# Patient Record
Sex: Male | Born: 1960 | Race: Black or African American | Hispanic: No | Marital: Married | Smoking: Never smoker
Health system: Southern US, Community
[De-identification: ages and names within clinical notes are randomized; demographics above are authoritative.]

## PROBLEM LIST (undated history)

## (undated) DIAGNOSIS — Z8739 Personal history of other diseases of the musculoskeletal system and connective tissue: Secondary | ICD-10-CM

## (undated) DIAGNOSIS — H409 Unspecified glaucoma: Secondary | ICD-10-CM

## (undated) DIAGNOSIS — E119 Type 2 diabetes mellitus without complications: Secondary | ICD-10-CM

## (undated) HISTORY — PX: HERNIA REPAIR: SHX51

---

## 2008-08-21 ENCOUNTER — Ambulatory Visit: Payer: Self-pay

## 2012-04-05 ENCOUNTER — Ambulatory Visit: Payer: Self-pay

## 2018-10-21 ENCOUNTER — Encounter: Payer: Self-pay | Admitting: Emergency Medicine

## 2018-10-21 ENCOUNTER — Other Ambulatory Visit: Payer: Self-pay

## 2018-10-21 ENCOUNTER — Emergency Department: Payer: Medicare Other

## 2018-10-21 ENCOUNTER — Emergency Department
Admission: EM | Admit: 2018-10-21 | Discharge: 2018-10-21 | Disposition: A | Discharge status: Home / Self Care | Payer: Medicare Other | Attending: Student | Admitting: Student

## 2018-10-21 DIAGNOSIS — R109 Unspecified abdominal pain: Secondary | ICD-10-CM | POA: Insufficient documentation

## 2018-10-21 DIAGNOSIS — R739 Hyperglycemia, unspecified: Secondary | ICD-10-CM | POA: Insufficient documentation

## 2018-10-21 HISTORY — DX: Type 2 diabetes mellitus without complications: E11.9

## 2018-10-21 HISTORY — DX: Personal history of other diseases of the musculoskeletal system and connective tissue: Z87.39

## 2018-10-21 LAB — CBC
HCT: 45.1 % (ref 39.0–52.0)
Hemoglobin: 14.9 g/dL (ref 13.0–17.0)
MCH: 25.3 pg — ABNORMAL LOW (ref 26.0–34.0)
MCHC: 33 g/dL (ref 30.0–36.0)
MCV: 76.6 fL — ABNORMAL LOW (ref 80.0–100.0)
Platelets: 310 10*3/uL (ref 150–400)
RBC: 5.89 MIL/uL — ABNORMAL HIGH (ref 4.22–5.81)
RDW: 12.3 % (ref 11.5–15.5)
WBC: 8.3 10*3/uL (ref 4.0–10.5)
nRBC: 0 % (ref 0.0–0.2)

## 2018-10-21 LAB — COMPREHENSIVE METABOLIC PANEL
ALT: 24 U/L (ref 0–44)
AST: 21 U/L (ref 15–41)
Albumin: 4 g/dL (ref 3.5–5.0)
Alkaline Phosphatase: 126 U/L (ref 38–126)
Anion gap: 9 (ref 5–15)
BUN: 17 mg/dL (ref 6–20)
CO2: 24 mmol/L (ref 22–32)
Calcium: 9.3 mg/dL (ref 8.9–10.3)
Chloride: 99 mmol/L (ref 98–111)
Creatinine, Ser: 0.94 mg/dL (ref 0.61–1.24)
GFR calc Af Amer: >60 mL/min (ref >60)
GFR calc non Af Amer: >60 mL/min (ref >60)
Glucose, Bld: 521 mg/dL (ref 70–99)
Potassium: 4.5 mmol/L (ref 3.5–5.1)
Sodium: 132 mmol/L — ABNORMAL LOW (ref 135–145)
Total Bilirubin: 0.7 mg/dL (ref 0.3–1.2)
Total Protein: 8.1 g/dL (ref 6.5–8.1)

## 2018-10-21 LAB — URINALYSIS, COMPLETE (UACMP) WITH MICROSCOPIC
Bacteria, UA: NONE SEEN
Bilirubin Urine: NEGATIVE
Glucose, UA: >=500 mg/dL — AB
Hgb urine dipstick: NEGATIVE
Ketones, ur: 5 mg/dL — AB
Leukocytes,Ua: NEGATIVE
Nitrite: NEGATIVE
Protein, ur: 30 mg/dL — AB
Specific Gravity, Urine: 1.028 (ref 1.005–1.030)
Squamous Epithelial / HPF: NONE SEEN (ref 0–5)
pH: 5 (ref 5.0–8.0)

## 2018-10-21 LAB — LIPASE, BLOOD: Lipase: 26 U/L (ref 11–51)

## 2018-10-21 LAB — GLUCOSE, CAPILLARY
Glucose-Capillary: 401 mg/dL — ABNORMAL HIGH (ref 70–99)
Glucose-Capillary: 335 mg/dL — ABNORMAL HIGH (ref 70–99)

## 2018-10-21 MED ORDER — SODIUM CHLORIDE 0.9 % IV BOLUS
1000.0000 mL | Freq: Once | INTRAVENOUS | Status: AC
  Administered 2018-10-21: 13:00:00 1000 mL via INTRAVENOUS

## 2018-10-21 MED ORDER — SODIUM CHLORIDE 0.9 % IV BOLUS
1000.0000 mL | Freq: Once | INTRAVENOUS | Status: AC
  Administered 2018-10-21: 1000 mL via INTRAVENOUS

## 2018-10-21 MED ORDER — MORPHINE SULFATE (PF) 4 MG/ML IV SOLN
4.0000 mg | Freq: Once | INTRAVENOUS | Status: AC
  Administered 2018-10-21: 10:00:00 4 mg via INTRAVENOUS
  Filled 2018-10-21: qty 1

## 2018-10-21 MED ORDER — METFORMIN HCL 500 MG PO TABS: 500.0000 mg | ORAL_TABLET | Freq: Two times a day (BID) | ORAL | 0 refills | Status: AC

## 2018-10-21 MED ORDER — IOHEXOL 300 MG/ML  SOLN
100.0000 mL | Freq: Once | INTRAMUSCULAR | Status: AC | PRN
  Administered 2018-10-21: 100 mL via INTRAVENOUS

## 2018-10-21 MED ORDER — ONDANSETRON HCL 4 MG/2ML IJ SOLN
4.0000 mg | Freq: Once | INTRAMUSCULAR | Status: AC
  Administered 2018-10-21: 10:00:00 4 mg via INTRAVENOUS
  Filled 2018-10-21: qty 2

## 2018-10-21 NOTE — ED Provider Notes (Addendum)
[facility] Emergency Department Provider Note  ____________________________________________   First MD Initiated Contact with Patient 10/21/18 [phone]     (approximate)  I have reviewed the triage vital signs and the nursing notes.  History  Chief Complaint Abdominal Pain    HPI Gary Sutton is a 58 y.o. male with history of glaucoma, degenerative disc disease, who presents to the emergency department for abdominal pain with some associated nausea.  Patient reports the onset of central abdominal pain several hours ago.  Associated with some mild nausea, but no vomiting.  Last bowel movement was earlier this morning.  He denies any fevers, dysuria, hematuria.  He denies any history of intra-abdominal surgeries.  He feels like his abdomen is more distended than normal.  He describes his discomfort as a tightness sensation, moderate in severity.  He denies any heavy alcohol use. No LE weakness, numbness, tingling.    Past Medical Hx Past Medical History:  Diagnosis Date  . H/O degenerative disc disease     Problem List There are no active problems to display for this patient.   Past Surgical Hx Past Surgical History:  Procedure Laterality Date  . HERNIA REPAIR      Medications Prior to Admission medications   Not on File    Allergies Patient has no known allergies.  Family Hx No family history on file.  Social Hx Social History   Tobacco Use  . Smoking status: Never Smoker  . Smokeless tobacco: Never Used  Substance Use Topics  . Alcohol use: Yes  . Drug use: Not on file     Review of Systems  Constitutional: Negative for fever, chills. Eyes: Negative for visual changes. ENT: Negative for sore throat. Cardiovascular: Negative for chest pain. Respiratory: Negative for shortness of breath. Gastrointestinal: + abdominal pain.  Genitourinary: Negative for dysuria. Musculoskeletal: Negative for leg swelling. Skin: Negative for rash.  Neurological: Negative for for headaches.   Physical Exam  Vital Signs: ED Triage Vitals [10/21/18 0949]  Enc Vitals Group     BP (!) 171/89     Pulse Rate 90     Resp 20     Temp 98.2 F (36.8 C)     Temp Source Oral     SpO2 100 %     Weight 185 lb (83.9 kg)     Height 6\' 2"  (1.88 m)     Head Circumference      Peak Flow      Pain Score 8     Pain Loc      Pain Edu?      Excl. in [place]?     Constitutional: Alert and oriented.  Head: Normocephalic. Atraumatic. Eyes: Conjunctivae clear. Sclera anicteric. Nose: No congestion. No rhinorrhea. Mouth/Throat: Mucous membranes are moist.  Neck: No stridor.   Cardiovascular: Normal rate, regular rhythm. No murmurs. Extremities well perfused. Respiratory: Normal respiratory effort.  Lungs CTAB. Gastrointestinal: Soft.  Somewhat distended.  Tender to palpation centrally, suprapubic, no rebound or guarding.  Suspect palpation of distended bladder. Musculoskeletal: No lower extremity edema. No deformities. Neurologic:  Normal speech and language. No gross focal neurologic deficits are appreciated.  Skin: Skin is warm, dry and intact. No rash noted. Psychiatric: Mood and affect are appropriate for situation.  EKG  Personally reviewed.   Rate: 92 Rhythm: Sinus Axis: Borderline leftward Intervals: Within normal limits No acute ischemic changes No STEMI    Radiology  CT: IMPRESSION: 1. No acute abdominopelvic findings. 2. Benign-appearing  LEFT renal cysts. 3. Mildly distended bladder. 4. 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    Procedures  Procedure(s) performed (including critical care):  Procedures   Initial Impression / Assessment and Plan / ED Course  58 y.o. male who presents to the ED for abdominal pain, distention, nausea, as above.  Ddx: obstruction, pancreatitis, AAA, urinary retention  Plan: labs, imaging, symptom control and reassess  CT without any acute findings.  Blood work reveals  hyperglycemia, no evidence of DKA. Glucose improved with fluids. Patient reports marked improvement in discomfort after urinating, suspect like distended bladder as etiology of his discomfort. No evidence of urinary infection. Remainder of work up unremarkable. Discussed results with patient. Will start him on metformin for new diagnosis diabetes, and given diabetic diet information. Advised follow up with PCP, given referral. Patient agreeable w/ plan and discharge. Given return precautions.     Final Clinical Impression(s) / ED Diagnosis  Final diagnoses:  Central abdominal pain       Note:  This document was prepared using Dragon voice recognition software and may include unintentional dictation errors.   Miguel Aschoff., MD 10/21/18 Barbette Reichmann    Miguel Aschoff., MD 10/21/18 [phone]

## 2018-10-21 NOTE — ED Notes (Signed)
ED Provider at bedside. 

## 2018-10-21 NOTE — ED Notes (Signed)
Patient was incontinent of urine. Patient was changed into new briefs and blue scrub pants assisted by [facility] ED tech.  Patient's son is at bedside.

## 2018-10-21 NOTE — ED Triage Notes (Addendum)
Pt arrived via POV with reports of stomach pain that started about 2 hours prior to arrival, pt c/o feeling a tightness in his stomach. Pt denies any N/V/D.  Pt also c/o low back pain related to DDD.    Abdominal is distended and tender to touch on arrival.

## 2018-10-21 NOTE — Discharge Instructions (Addendum)
Thank you for letting us take care of you in the emergency department today.   Please continue to take any regular, prescribed medications.   New medications we have prescribed:  - Metformin - to treat your diabetes  Please follow up with: - A primary care doctor to review your ER visit and follow up on your symptoms. Below are two options where you can make an appointment.   Please return to the ER for any new or worsening symptoms.

## 2018-10-21 NOTE — ED Notes (Signed)
Patient taken to CT scan.

## 2018-10-21 NOTE — ED Notes (Signed)
No portable computers available.  Patient verbalized understanding of discharge instructions and denies further questions.  Patient offered living well with diabetes book and refused.  Patient given resources booklet and given instructions on importance of primary care follow-up for diabetes.  Patient and son verbalized understanding.

## 2018-10-21 NOTE — ED Notes (Signed)
Dr. Joan Mayans aware of blood glucose of 521.

## 2019-01-19 ENCOUNTER — Emergency Department: Payer: Medicare Other

## 2019-01-19 ENCOUNTER — Encounter: Payer: Self-pay | Admitting: Emergency Medicine

## 2019-01-19 ENCOUNTER — Emergency Department
Admission: EM | Admit: 2019-01-19 | Discharge: 2019-01-19 | Disposition: A | Discharge status: Home / Self Care | Payer: Medicare Other | Attending: Emergency Medicine | Admitting: Emergency Medicine

## 2019-01-19 ENCOUNTER — Other Ambulatory Visit: Payer: Self-pay

## 2019-01-19 DIAGNOSIS — E119 Type 2 diabetes mellitus without complications: Secondary | ICD-10-CM | POA: Diagnosis not present

## 2019-01-19 DIAGNOSIS — Z20822 Contact with and (suspected) exposure to covid-19: Secondary | ICD-10-CM

## 2019-01-19 DIAGNOSIS — R109 Unspecified abdominal pain: Secondary | ICD-10-CM | POA: Diagnosis present

## 2019-01-19 DIAGNOSIS — Z79899 Other long term (current) drug therapy: Secondary | ICD-10-CM | POA: Diagnosis not present

## 2019-01-19 DIAGNOSIS — R1084 Generalized abdominal pain: Secondary | ICD-10-CM | POA: Insufficient documentation

## 2019-01-19 DIAGNOSIS — J189 Pneumonia, unspecified organism: Secondary | ICD-10-CM | POA: Insufficient documentation

## 2019-01-19 DIAGNOSIS — Z7984 Long term (current) use of oral hypoglycemic drugs: Secondary | ICD-10-CM | POA: Diagnosis not present

## 2019-01-19 HISTORY — DX: Unspecified glaucoma: H40.9

## 2019-01-19 LAB — CBC WITH DIFFERENTIAL/PLATELET
Abs Immature Granulocytes: 0.02 10*3/uL (ref 0.00–0.07)
Basophils Absolute: 0 10*3/uL (ref 0.0–0.1)
Basophils Relative: 0 %
Eosinophils Absolute: 0 10*3/uL (ref 0.0–0.5)
Eosinophils Relative: 0 %
HCT: 41.7 % (ref 39.0–52.0)
Hemoglobin: 14.3 g/dL (ref 13.0–17.0)
Immature Granulocytes: 0 %
Lymphocytes Relative: 6 %
Lymphs Abs: 0.4 10*3/uL — ABNORMAL LOW (ref 0.7–4.0)
MCH: 24.6 pg — ABNORMAL LOW (ref 26.0–34.0)
MCHC: 34.3 g/dL (ref 30.0–36.0)
MCV: 71.6 fL — ABNORMAL LOW (ref 80.0–100.0)
Monocytes Absolute: 0.3 10*3/uL (ref 0.1–1.0)
Monocytes Relative: 4 %
Neutro Abs: 6.1 10*3/uL (ref 1.7–7.7)
Neutrophils Relative %: 90 %
Platelets: 191 10*3/uL (ref 150–400)
RBC: 5.82 MIL/uL — ABNORMAL HIGH (ref 4.22–5.81)
RDW: 12.9 % (ref 11.5–15.5)
WBC: 6.9 10*3/uL (ref 4.0–10.5)
nRBC: 0 % (ref 0.0–0.2)

## 2019-01-19 LAB — URINALYSIS, ROUTINE W REFLEX MICROSCOPIC
Bilirubin Urine: NEGATIVE
Glucose, UA: >=500 mg/dL — AB
Ketones, ur: 20 mg/dL — AB
Leukocytes,Ua: NEGATIVE
Nitrite: NEGATIVE
Protein, ur: >=300 mg/dL — AB
Specific Gravity, Urine: 1.023 (ref 1.005–1.030)
pH: 5 (ref 5.0–8.0)

## 2019-01-19 LAB — COMPREHENSIVE METABOLIC PANEL
ALT: 25 U/L (ref 0–44)
AST: 21 U/L (ref 15–41)
Albumin: 3 g/dL — ABNORMAL LOW (ref 3.5–5.0)
Alkaline Phosphatase: 56 U/L (ref 38–126)
Anion gap: 14 (ref 5–15)
BUN: 14 mg/dL (ref 6–20)
CO2: 20 mmol/L — ABNORMAL LOW (ref 22–32)
Calcium: 7.9 mg/dL — ABNORMAL LOW (ref 8.9–10.3)
Chloride: 98 mmol/L (ref 98–111)
Creatinine, Ser: 0.89 mg/dL (ref 0.61–1.24)
GFR calc Af Amer: >60 mL/min (ref >60)
GFR calc non Af Amer: >60 mL/min (ref >60)
Glucose, Bld: 267 mg/dL — ABNORMAL HIGH (ref 70–99)
Potassium: 3.2 mmol/L — ABNORMAL LOW (ref 3.5–5.1)
Sodium: 132 mmol/L — ABNORMAL LOW (ref 135–145)
Total Bilirubin: 0.8 mg/dL (ref 0.3–1.2)
Total Protein: 7.5 g/dL (ref 6.5–8.1)

## 2019-01-19 LAB — ACETAMINOPHEN LEVEL: Acetaminophen (Tylenol), Serum: 11 ug/mL (ref 10–30)

## 2019-01-19 LAB — LIPASE, BLOOD: Lipase: 20 U/L (ref 11–51)

## 2019-01-19 LAB — AMMONIA: Ammonia: 14 umol/L (ref 9–35)

## 2019-01-19 LAB — PROCALCITONIN: Procalcitonin: <0.1 ng/mL

## 2019-01-19 LAB — LACTIC ACID, PLASMA: Lactic Acid, Venous: 1.8 mmol/L (ref 0.5–1.9)

## 2019-01-19 MED ORDER — SODIUM CHLORIDE 0.9 % IV BOLUS
500.0000 mL | Freq: Once | INTRAVENOUS | Status: AC
  Administered 2019-01-19: 02:00:00 500 mL via INTRAVENOUS

## 2019-01-19 MED ORDER — IOHEXOL 300 MG/ML  SOLN
100.0000 mL | Freq: Once | INTRAMUSCULAR | Status: AC | PRN
  Administered 2019-01-19: 100 mL via INTRAVENOUS

## 2019-01-19 NOTE — ED Notes (Signed)
Daughter called and with pt POC for DC and follow up discussed long with lab results

## 2019-01-19 NOTE — ED Triage Notes (Signed)
Patient presents to Emergency Department via [facility] from home with complaints of abdominal pain for 5 days with chills and SOB.     History of chronic back and left shoulder pain, and "severe" glaucoma

## 2019-01-19 NOTE — ED Triage Notes (Signed)
Pt reports taking 6 extra strength tylenol as needed, last was yesterday

## 2019-01-19 NOTE — Discharge Instructions (Signed)
As we discussed, your work-up tonight was generally reassuring with no indication of an infection in your abdomen.  Your lab work was essentially normal as well.  However there is what looks like a viral pneumonia in your lungs which is most likely due to COVID-19 (coronavirus).  You declined testing tonight but please know that you can be tested at any of the outpatient testing centers.  At this point you should consider yourself positive and try to avoid anyone other than family who help care for you at home so as not to potentially infect anyone else.  Return to the emergency department with new or worsening symptoms that concern you.

## 2019-01-19 NOTE — ED Notes (Signed)
Discharge instructions reviewed with patient. Questions fielded by this RN. Patient verbalizes understanding of instructions. Patient discharged home in stable condition per forbach. No acute distress noted at time of discharge.   Peripheral IV discontinued. Catheter intact. No signs of infiltration or redness. Gauze applied to IV site.   Pt wheeled to lobby and loaded to car

## 2019-01-19 NOTE — ED Notes (Signed)
Daughter on RN phone, given to pt

## 2019-01-19 NOTE — ED Notes (Addendum)
Pt counseled on safe tylenol dosing, pt verbally acknowledged  Pt reports last BM was Sunday

## 2019-01-19 NOTE — ED Provider Notes (Signed)
[facility] Emergency Department Provider Note  ____________________________________________   First MD Initiated Contact with Patient 01/19/19 0116     (approximate)  I have reviewed the triage vital signs and the nursing notes.   HISTORY  Chief Complaint Abdominal Pain, Shortness of Breath, and Chills    HPI Gary Sutton is a 58 y.o. male with medical issues as listed below notable for severe glaucoma and diabetes.  He presents tonight by EMS for about 5 days of sharp and aching abdominal pain and chills.  He had a very low-grade fever upon arrival to the ED.  He says he has been drinking a lot of water but has not been eating much food.  He denies nausea and vomiting.  He denies COVID-19 contacts.  He denies chest pain, sore throat, cough.  He claims that he has a little bit of shortness of breath associated with the abdominal pain.  He also feels like his abdomen is distended.  He is unaware of any history of kidney or liver dysfunction.  He describes the symptoms as moderate and he has been taking extra strength Tylenol which may help a little bit.  Nothing in particular makes the symptoms worse.  He has chronic back issues that results in some chronic numbness and tingling in bilateral lower extremities which he says is stable.         Past Medical History:  Diagnosis Date  . Diabetes mellitus without complication (HCC)   . Glaucoma   . H/O degenerative disc disease     There are no problems to display for this patient.   Past Surgical History:  Procedure Laterality Date  . HERNIA REPAIR      Prior to Admission medications   Medication Sig Start Date End Date Taking? Authorizing Provider  brimonidine (ALPHAGAN) 0.2 % ophthalmic solution Place 1 drop into both eyes 2 (two) times daily.    [provider]  cholecalciferol (VITAMIN D3) 25 MCG (1000 UT) tablet Take 1,000 Units by mouth daily.    [provider]  metFORMIN  (GLUCOPHAGE) 500 MG tablet Take 1 tablet (500 mg total) by mouth 2 (two) times daily with a meal. 10/21/18 12/20/18  Miguel AschoffMonks, Sarah L., MD  Methylsulfonylmethane (MSM) 1000 MG CAPS Take 1 capsule by mouth daily.    [provider]  timolol (BETIMOL) 0.25 % ophthalmic solution Place 1 drop into both eyes 2 (two) times daily.     [provider]  TURMERIC PO Take 1 tablet by mouth daily.    [provider]  zinc gluconate 50 MG tablet Take 50 mg by mouth daily.    [provider]    Allergies Patient has no known allergies.  History reviewed. No pertinent family history.  Social History Social History   Tobacco Use  . Smoking status: Never Smoker  . Smokeless tobacco: Never Used  Substance Use Topics  . Alcohol use: Yes  . Drug use: Never    Review of Systems Constitutional: +fever/chills Eyes: No visual changes. ENT: No sore throat. Cardiovascular: Denies chest pain. Respiratory: Mild shortness of breath. Gastrointestinal: Generalized abdominal pain, no nausea nor vomiting. Genitourinary: Negative for dysuria. Musculoskeletal: Negative for neck pain.  Negative for back pain. Integumentary: Negative for rash. Neurological: Negative for headaches, focal weakness or numbness.   ____________________________________________   PHYSICAL EXAM:  VITAL SIGNS: ED Triage Vitals  Enc Vitals Group     BP 01/19/19 0133 (!) 163/85     Pulse  Rate 01/19/19 0133 (!) 101     Resp 01/19/19 0133 15     Temp 01/19/19 0133 100 F (37.8 C)     Temp Source 01/19/19 0133 Oral     SpO2 01/19/19 0133 96 %     Weight 01/19/19 0134 83.9 kg (185 lb)     Height 01/19/19 0134 1.88 m ( )     Head Circumference --      Peak Flow --      Pain Score 01/19/19 0133 8     Pain Loc --      Pain Edu? --      Excl. in [place]? --     Constitutional: Alert and oriented.  No acute distress. Eyes: Severely limited vision at baseline secondary to severe glaucoma. Head:  Atraumatic. Nose: No congestion/rhinnorhea. Mouth/Throat: Patient is wearing a mask. Neck: No stridor.  No meningeal signs.   Cardiovascular: Mild tachycardia, regular rhythm. Good peripheral circulation. Grossly normal heart sounds. Respiratory: Normal respiratory effort.  No retractions. Gastrointestinal: Distended lower abdomen that feels tense but not peritoneal.  Diffuse tenderness to palpation all throughout the lower abdomen without any specific focal tenderness. Musculoskeletal: No lower extremity tenderness nor edema. No gross deformities of extremities. Neurologic:  Normal speech and language. No gross focal neurologic deficits are appreciated.  Skin:  Skin is warm, dry and intact. Psychiatric: Mood and affect are normal. Speech and behavior are normal.  ____________________________________________   LABS (all labs ordered are listed, but only abnormal results are displayed)  Labs Reviewed  COMPREHENSIVE METABOLIC PANEL - Abnormal; Notable for the following components:      Result Value   Sodium 132 (*)    Potassium 3.2 (*)    CO2 20 (*)    Glucose, Bld 267 (*)    Calcium 7.9 (*)    Albumin 3.0 (*)    All other components within normal limits  CBC WITH DIFFERENTIAL/PLATELET - Abnormal; Notable for the following components:   RBC 5.82 (*)    MCV 71.6 (*)    MCH 24.6 (*)    Lymphs Abs 0.4 (*)    All other components within normal limits  LACTIC ACID, PLASMA  LIPASE, BLOOD  PROCALCITONIN  AMMONIA  ACETAMINOPHEN LEVEL  LACTIC ACID, PLASMA  URINALYSIS, ROUTINE W REFLEX MICROSCOPIC   ____________________________________________  EKG  ED ECG REPORT I, Loleta Rose, the attending physician, personally viewed and interpreted this ECG.  Date: 01/19/2019 EKG Time: 1:21 AM Rate: 101 Rhythm: Sinus tachycardia QRS Axis: normal Intervals: normal ST/T Wave abnormalities: Non-specific ST segment / T-wave changes, but no clear evidence of acute ischemia. Narrative  Interpretation: no definitive evidence of acute ischemia; does not meet STEMI criteria.   ____________________________________________  RADIOLOGY I, Loleta Rose, personally viewed and evaluated these images (plain radiographs) as part of my medical decision making, as well as reviewing the written report by the radiologist.  ED MD interpretation:  No acute abnormalities in abd/pelvis, but patchy ground-glass opacities in lung bases consistent with atypical pneumonia.  Official radiology report(s): CT abd/pelvis w/ IV contrast  Result Date: 01/19/2019 CLINICAL DATA:  Nausea vomiting abdominal pain EXAM: CT ABDOMEN AND PELVIS WITH CONTRAST TECHNIQUE: Multidetector CT imaging of the abdomen and pelvis was performed using the standard protocol following bolus administration of intravenous contrast. CONTRAST:  OMNIPAQUE IOHEXOL 300 MG/ML  SOLN COMPARISON:  October 21, 2018 FINDINGS: Lower chest: The visualized heart size within normal limits. No pericardial fluid/thickening. No hiatal hernia. Patchy ground-glass opacities are seen  at the periphery of the bilateral lung bases. Hepatobiliary: The liver is normal in density without focal abnormality.The main portal vein is patent. No evidence of calcified gallstones, gallbladder wall thickening or biliary dilatation. Pancreas: Unremarkable. No pancreatic ductal dilatation or surrounding inflammatory changes. Spleen: Normal in size without focal abnormality. Adrenals/Urinary Tract: Both adrenal glands appear normal. Again noted are multiple low-density lesions within the left kidney the largest measuring 3 cm in the midpole. Mild bilateral perinephric stranding is seen. No hydronephrosis. No renal or collecting system calculi Stomach/Bowel: The stomach, small bowel, and colon are normal in appearance. No inflammatory changes, wall thickening, or obstructive findings.Scattered colonic diverticula are noted. The appendix is unremarkable.  Vascular/Lymphatic: There are no enlarged mesenteric, retroperitoneal, or pelvic lymph nodes. No significant vascular findings are present. Reproductive: The prostate is unremarkable. Other: No evidence of abdominal wall mass or hernia. Musculoskeletal: Flowing osteophytes are seen in the thoracic and lumbar spine. There is ankylosis of the bilateral sacroiliac joints. There is diffuse osteopenia. Advanced bilateral hip osteoarthritis is seen with superior joint space loss and marginal osteophyte formation. IMPRESSION: Patchy/ground-glass opacities within the lower lung base, which could be due to atypical viral pneumonia. Findings which could be suggestive of ankylosing spondylitis Electronically Signed   By: Prudencio Pair M.D.   On: 01/19/2019 03:54    ____________________________________________   PROCEDURES   Procedure(s) performed (including Critical Care):  Procedures   ____________________________________________   INITIAL IMPRESSION / MDM / [place] / ED COURSE  As part of my medical decision making, I reviewed the following data within the [place] notes reviewed and incorporated, Labs reviewed , EKG interpreted , Old chart reviewed and Notes from prior ED visits   Differential diagnosis includes, but is not limited to, acute intra-abdominal infection such as appendicitis or diverticulitis, biliary disease, renal failure with ascites, electrolyte or metabolic abnormality, kidney dysfunction, less likely COVID-19.    Labs are pending and I am evaluating broadly.  Giving small fluid bolus for mild tachycardia 500 mL normal saline IV.  Will reassess.       Clinical Course as of Jan 18 437  Sat Jan 19, 2019  0212 WBC: 6.9 [CF]  0350 Acetaminophen (Tylenol), S: 11 [CF]  0350 Procalcitonin: <0.10 [CF]  0433 No acute abnormalities on abdomen and pelvis CT but suggestive of generalized opacities in the lungs, likely atypical pneumonia.  I updated  the patient with the generally reassuring results.  He is tolerating fluids right now and is in no distress.  I ask about Covid contacts and he can think of no one he knows who has tested positive.  I explained the results and I strongly encouraged him to let me test him so that he would have the results in mychart within a day or 2, and even after a discussion of why I thought it was important for him and his family, he is declining the test.  He acknowledged that he could have a test as an outpatient if he wants to do so.  I encouraged him to consider himself as Covid positive for the purposes of isolation and staying away from his family as much as possible although he does require caregivers given his limited vision and glaucoma.  There is no indication for any additional treatment at this time.  The patient is breathing comfortably, continues to have very mild tachycardia but no hypoxemia, and is in no respiratory distress.  I gave my usual and customary return  precautions.   [CF]    Clinical Course User Index [CF] Loleta Rose, MD     ____________________________________________  FINAL CLINICAL IMPRESSION(S) / ED DIAGNOSES  Final diagnoses:  Generalized abdominal pain  Atypical pneumonia  Suspected COVID-19 virus infection     MEDICATIONS GIVEN DURING THIS VISIT:  Medications  sodium chloride 0.9 % bolus 500 mL (0 mLs Intravenous Stopped 01/19/19 0300)  iohexol (OMNIPAQUE) 300 MG/ML solution 100 mL (100 mLs Intravenous Contrast Given 01/19/19 0932)     ED Discharge Orders    None      *Please note:  ANTAR MILKS was evaluated in Emergency Department on 01/19/2019 for the symptoms described in the history of present illness. He was evaluated in the context of the global COVID-19 pandemic, which necessitated consideration that the patient might be at risk for infection with the SARS-CoV-2 virus that causes COVID-19. Institutional protocols and algorithms that pertain to the  evaluation of patients at risk for COVID-19 are in a state of rapid change based on information released by regulatory bodies including the CDC and federal and state organizations. These policies and algorithms were followed during the patient's care in the ED.  Some ED evaluations and interventions may be delayed as a result of limited staffing during the pandemic.*  Note:  This document was prepared using Dragon voice recognition software and may include unintentional dictation errors.   Loleta Rose, MD 01/19/19 [phone]

## 2019-01-19 NOTE — ED Notes (Signed)
Daughter, Crystal, phone number added, please call for pick up

## 2019-11-05 ENCOUNTER — Inpatient Hospital Stay
Admission: EM | Admit: 2019-11-05 | Discharge: 2019-11-20 | DRG: 854 | Disposition: A | Discharge status: Skilled Nursing Facility | Payer: Medicare Other | Attending: Internal Medicine | Admitting: Internal Medicine

## 2019-11-05 ENCOUNTER — Other Ambulatory Visit: Payer: Self-pay

## 2019-11-05 ENCOUNTER — Emergency Department: Payer: Medicare Other

## 2019-11-05 DIAGNOSIS — M869 Osteomyelitis, unspecified: Secondary | ICD-10-CM

## 2019-11-05 DIAGNOSIS — D509 Iron deficiency anemia, unspecified: Secondary | ICD-10-CM | POA: Diagnosis present

## 2019-11-05 DIAGNOSIS — M7061 Trochanteric bursitis, right hip: Secondary | ICD-10-CM | POA: Diagnosis present

## 2019-11-05 DIAGNOSIS — E871 Hypo-osmolality and hyponatremia: Secondary | ICD-10-CM | POA: Diagnosis not present

## 2019-11-05 DIAGNOSIS — Z20822 Contact with and (suspected) exposure to covid-19: Secondary | ICD-10-CM | POA: Diagnosis present

## 2019-11-05 DIAGNOSIS — H409 Unspecified glaucoma: Secondary | ICD-10-CM | POA: Diagnosis present

## 2019-11-05 DIAGNOSIS — M8610 Other acute osteomyelitis, unspecified site: Secondary | ICD-10-CM

## 2019-11-05 DIAGNOSIS — E119 Type 2 diabetes mellitus without complications: Secondary | ICD-10-CM | POA: Diagnosis not present

## 2019-11-05 DIAGNOSIS — M459 Ankylosing spondylitis of unspecified sites in spine: Secondary | ICD-10-CM | POA: Diagnosis present

## 2019-11-05 DIAGNOSIS — E1165 Type 2 diabetes mellitus with hyperglycemia: Secondary | ICD-10-CM | POA: Diagnosis present

## 2019-11-05 DIAGNOSIS — E1140 Type 2 diabetes mellitus with diabetic neuropathy, unspecified: Secondary | ICD-10-CM | POA: Diagnosis present

## 2019-11-05 DIAGNOSIS — D649 Anemia, unspecified: Secondary | ICD-10-CM | POA: Diagnosis not present

## 2019-11-05 DIAGNOSIS — M5126 Other intervertebral disc displacement, lumbar region: Secondary | ICD-10-CM | POA: Diagnosis present

## 2019-11-05 DIAGNOSIS — E1169 Type 2 diabetes mellitus with other specified complication: Secondary | ICD-10-CM | POA: Diagnosis present

## 2019-11-05 DIAGNOSIS — E11622 Type 2 diabetes mellitus with other skin ulcer: Secondary | ICD-10-CM | POA: Diagnosis present

## 2019-11-05 DIAGNOSIS — L03031 Cellulitis of right toe: Secondary | ICD-10-CM | POA: Diagnosis present

## 2019-11-05 DIAGNOSIS — E875 Hyperkalemia: Secondary | ICD-10-CM | POA: Diagnosis not present

## 2019-11-05 DIAGNOSIS — H5462 Unqualified visual loss, left eye, normal vision right eye: Secondary | ICD-10-CM | POA: Diagnosis present

## 2019-11-05 DIAGNOSIS — Z794 Long term (current) use of insulin: Secondary | ICD-10-CM | POA: Diagnosis not present

## 2019-11-05 DIAGNOSIS — L97519 Non-pressure chronic ulcer of other part of right foot with unspecified severity: Secondary | ICD-10-CM | POA: Diagnosis present

## 2019-11-05 DIAGNOSIS — M609 Myositis, unspecified: Secondary | ICD-10-CM | POA: Diagnosis present

## 2019-11-05 DIAGNOSIS — R7881 Bacteremia: Secondary | ICD-10-CM | POA: Diagnosis not present

## 2019-11-05 DIAGNOSIS — D519 Vitamin B12 deficiency anemia, unspecified: Secondary | ICD-10-CM | POA: Diagnosis present

## 2019-11-05 DIAGNOSIS — H548 Legal blindness, as defined in USA: Secondary | ICD-10-CM | POA: Diagnosis not present

## 2019-11-05 DIAGNOSIS — Z79899 Other long term (current) drug therapy: Secondary | ICD-10-CM

## 2019-11-05 DIAGNOSIS — L089 Local infection of the skin and subcutaneous tissue, unspecified: Secondary | ICD-10-CM | POA: Diagnosis not present

## 2019-11-05 DIAGNOSIS — M7062 Trochanteric bursitis, left hip: Secondary | ICD-10-CM | POA: Diagnosis present

## 2019-11-05 DIAGNOSIS — M160 Bilateral primary osteoarthritis of hip: Secondary | ICD-10-CM | POA: Diagnosis present

## 2019-11-05 DIAGNOSIS — M009 Pyogenic arthritis, unspecified: Secondary | ICD-10-CM | POA: Diagnosis present

## 2019-11-05 DIAGNOSIS — R609 Edema, unspecified: Secondary | ICD-10-CM | POA: Diagnosis present

## 2019-11-05 DIAGNOSIS — A4101 Sepsis due to Methicillin susceptible Staphylococcus aureus: Principal | ICD-10-CM | POA: Diagnosis present

## 2019-11-05 DIAGNOSIS — G629 Polyneuropathy, unspecified: Secondary | ICD-10-CM | POA: Diagnosis present

## 2019-11-05 DIAGNOSIS — M8600 Acute hematogenous osteomyelitis, unspecified site: Secondary | ICD-10-CM | POA: Diagnosis not present

## 2019-11-05 DIAGNOSIS — Z7984 Long term (current) use of oral hypoglycemic drugs: Secondary | ICD-10-CM

## 2019-11-05 DIAGNOSIS — B9561 Methicillin susceptible Staphylococcus aureus infection as the cause of diseases classified elsewhere: Secondary | ICD-10-CM | POA: Diagnosis not present

## 2019-11-05 LAB — CBC WITH DIFFERENTIAL/PLATELET
Abs Immature Granulocytes: 0.06 10*3/uL (ref 0.00–0.07)
Basophils Absolute: 0 10*3/uL (ref 0.0–0.1)
Basophils Relative: 0 %
Eosinophils Absolute: 0 10*3/uL (ref 0.0–0.5)
Eosinophils Relative: 0 %
HCT: 33.3 % — ABNORMAL LOW (ref 39.0–52.0)
Hemoglobin: 10.7 g/dL — ABNORMAL LOW (ref 13.0–17.0)
Immature Granulocytes: 1 %
Lymphocytes Relative: 8 %
Lymphs Abs: 1 10*3/uL (ref 0.7–4.0)
MCH: 23.9 pg — ABNORMAL LOW (ref 26.0–34.0)
MCHC: 32.1 g/dL (ref 30.0–36.0)
MCV: 74.5 fL — ABNORMAL LOW (ref 80.0–100.0)
Monocytes Absolute: 0.7 10*3/uL (ref 0.1–1.0)
Monocytes Relative: 5 %
Neutro Abs: 11.5 10*3/uL — ABNORMAL HIGH (ref 1.7–7.7)
Neutrophils Relative %: 86 %
Platelets: 621 10*3/uL — ABNORMAL HIGH (ref 150–400)
RBC: 4.47 MIL/uL (ref 4.22–5.81)
RDW: 13.4 % (ref 11.5–15.5)
WBC: 13.3 10*3/uL — ABNORMAL HIGH (ref 4.0–10.5)
nRBC: 0 % (ref 0.0–0.2)

## 2019-11-05 LAB — COMPREHENSIVE METABOLIC PANEL
ALT: 13 U/L (ref 0–44)
AST: 13 U/L — ABNORMAL LOW (ref 15–41)
Albumin: 2.7 g/dL — ABNORMAL LOW (ref 3.5–5.0)
Alkaline Phosphatase: 105 U/L (ref 38–126)
Anion gap: 14 (ref 5–15)
BUN: 11 mg/dL (ref 8–23)
CO2: 23 mmol/L (ref 22–32)
Calcium: 8.2 mg/dL — ABNORMAL LOW (ref 8.9–10.3)
Chloride: 93 mmol/L — ABNORMAL LOW (ref 98–111)
Creatinine, Ser: 0.89 mg/dL (ref 0.61–1.24)
GFR, Estimated: >60 mL/min (ref >60)
Glucose, Bld: 368 mg/dL — ABNORMAL HIGH (ref 70–99)
Potassium: 3.9 mmol/L (ref 3.5–5.1)
Sodium: 130 mmol/L — ABNORMAL LOW (ref 135–145)
Total Bilirubin: 0.9 mg/dL (ref 0.3–1.2)
Total Protein: 9.1 g/dL — ABNORMAL HIGH (ref 6.5–8.1)

## 2019-11-05 LAB — URINALYSIS, COMPLETE (UACMP) WITH MICROSCOPIC
Bilirubin Urine: NEGATIVE
Glucose, UA: >=500 mg/dL — AB
Ketones, ur: 20 mg/dL — AB
Leukocytes,Ua: NEGATIVE
Nitrite: NEGATIVE
Protein, ur: >=300 mg/dL — AB
Specific Gravity, Urine: 1.03 (ref 1.005–1.030)
Squamous Epithelial / HPF: NONE SEEN (ref 0–5)
pH: 5 (ref 5.0–8.0)

## 2019-11-05 LAB — LACTIC ACID, PLASMA: Lactic Acid, Venous: 1.2 mmol/L (ref 0.5–1.9)

## 2019-11-05 LAB — RESPIRATORY PANEL BY RT PCR (FLU A&B, COVID)
Influenza A by PCR: NEGATIVE
Influenza B by PCR: NEGATIVE
SARS Coronavirus 2 by RT PCR: NEGATIVE

## 2019-11-05 LAB — PROTIME-INR
INR: 1.3 — ABNORMAL HIGH (ref 0.8–1.2)
Prothrombin Time: 15.2 seconds (ref 11.4–15.2)

## 2019-11-05 MED ORDER — TIMOLOL HEMIHYDRATE 0.25 % OP SOLN
1.0000 [drp] | Freq: Two times a day (BID) | OPHTHALMIC | Status: DC
  Administered 2019-11-06 – 2019-11-20 (×28): 1 [drp] via OPHTHALMIC
  Filled 2019-11-05: qty 5

## 2019-11-05 MED ORDER — SODIUM CHLORIDE 0.9 % IV SOLN
2.0000 g | Freq: Once | INTRAVENOUS | Status: AC
  Administered 2019-11-05: 2 g via INTRAVENOUS
  Filled 2019-11-05: qty 2

## 2019-11-05 MED ORDER — FENTANYL CITRATE (PF) 100 MCG/2ML IJ SOLN
50.0000 ug | Freq: Once | INTRAMUSCULAR | Status: AC
  Administered 2019-11-05: 50 ug via INTRAVENOUS
  Filled 2019-11-05: qty 2

## 2019-11-05 MED ORDER — ZINC GLUCONATE 50 MG PO TABS: 50.0000 mg | ORAL_TABLET | Freq: Every day | ORAL | Status: DC

## 2019-11-05 MED ORDER — ACETAMINOPHEN 650 MG RE SUPP
650.0000 mg | Freq: Four times a day (QID) | RECTAL | Status: DC | PRN
  Filled 2019-11-05: qty 1

## 2019-11-05 MED ORDER — MORPHINE SULFATE (PF) 2 MG/ML IV SOLN
2.0000 mg | INTRAVENOUS | Status: DC | PRN
  Administered 2019-11-06: 2 mg via INTRAVENOUS
  Filled 2019-11-05: qty 1

## 2019-11-05 MED ORDER — LACTATED RINGERS IV BOLUS
1000.0000 mL | Freq: Once | INTRAVENOUS | Status: AC
  Administered 2019-11-05: 1000 mL via INTRAVENOUS

## 2019-11-05 MED ORDER — ACETAMINOPHEN 325 MG PO TABS
650.0000 mg | ORAL_TABLET | Freq: Four times a day (QID) | ORAL | Status: DC | PRN
  Administered 2019-11-08 – 2019-11-16 (×3): 650 mg via ORAL
  Filled 2019-11-05 (×3): qty 2

## 2019-11-05 MED ORDER — TURMERIC 500 MG PO CAPS: ORAL_CAPSULE | Freq: Every day | ORAL | Status: DC

## 2019-11-05 MED ORDER — TRAZODONE HCL 50 MG PO TABS
25.0000 mg | ORAL_TABLET | Freq: Every evening | ORAL | Status: DC | PRN
  Administered 2019-11-06 – 2019-11-16 (×6): 25 mg via ORAL
  Filled 2019-11-05 (×7): qty 1

## 2019-11-05 MED ORDER — ENOXAPARIN SODIUM 40 MG/0.4ML ~~LOC~~ SOLN
40.0000 mg | SUBCUTANEOUS | Status: DC
  Administered 2019-11-06 – 2019-11-20 (×14): 40 mg via SUBCUTANEOUS
  Filled 2019-11-05 (×14): qty 0.4

## 2019-11-05 MED ORDER — VANCOMYCIN HCL IN DEXTROSE 1-5 GM/200ML-% IV SOLN: 1000.0000 mg | Freq: Once | INTRAVENOUS | Status: DC

## 2019-11-05 MED ORDER — VITAMIN D 25 MCG (1000 UNIT) PO TABS
1000.0000 [IU] | ORAL_TABLET | Freq: Every day | ORAL | Status: DC
  Administered 2019-11-06 – 2019-11-20 (×14): 1000 [IU] via ORAL
  Filled 2019-11-05 (×15): qty 1

## 2019-11-05 MED ORDER — MSM 1000 MG PO CAPS: 1.0000 | ORAL_CAPSULE | Freq: Every day | ORAL | Status: DC

## 2019-11-05 MED ORDER — VANCOMYCIN HCL IN DEXTROSE 1-5 GM/200ML-% IV SOLN
1000.0000 mg | Freq: Once | INTRAVENOUS | Status: AC
  Administered 2019-11-05: 1000 mg via INTRAVENOUS
  Filled 2019-11-05: qty 200

## 2019-11-05 MED ORDER — MAGNESIUM HYDROXIDE 400 MG/5ML PO SUSP
30.0000 mL | Freq: Every day | ORAL | Status: DC | PRN
  Administered 2019-11-12 – 2019-11-20 (×3): 30 mL via ORAL
  Filled 2019-11-05 (×4): qty 30

## 2019-11-05 MED ORDER — SODIUM CHLORIDE 0.9 % IV SOLN: INTRAVENOUS | Status: DC

## 2019-11-05 MED ORDER — ONDANSETRON HCL 4 MG/2ML IJ SOLN
4.0000 mg | Freq: Four times a day (QID) | INTRAMUSCULAR | Status: DC | PRN
  Administered 2019-11-17: 4 mg via INTRAVENOUS

## 2019-11-05 MED ORDER — BRIMONIDINE TARTRATE 0.2 % OP SOLN
1.0000 [drp] | Freq: Two times a day (BID) | OPHTHALMIC | Status: DC
  Administered 2019-11-06 – 2019-11-20 (×28): 1 [drp] via OPHTHALMIC
  Filled 2019-11-05 (×2): qty 5

## 2019-11-05 MED ORDER — ONDANSETRON HCL 4 MG PO TABS
4.0000 mg | ORAL_TABLET | Freq: Four times a day (QID) | ORAL | Status: DC | PRN
  Filled 2019-11-05: qty 1

## 2019-11-05 NOTE — H&P (Addendum)
[place]   PATIENT NAME: Gary Sutton    MR#:  [id number]  DATE OF BIRTH:  [birth year]  DATE OF ADMISSION:  11/05/2019  PRIMARY CARE PHYSICIAN: Patient, No Pcp Per   REQUESTING/REFERRING PHYSICIAN: Phineas Semen, MD CHIEF COMPLAINT:   Chief Complaint  Patient presents with  . Leg Pain    HISTORY OF PRESENT ILLNESS:  Gary Sutton  is a 59 y.o. male with a known history of type 2 diabetes mellitus, glaucoma and degenerative disc disease, who presented to the emergency room with acute onset of right lower extremity pain involving his leg with associated right foot swelling with erythema at the big toe.  The patient stated that he had pain around his right hip flexors and has not had any trauma.  He admitted to chills but did not check his temperature.  His right big toe has been swelling over the last week.  No chest pain or dyspnea or cough or wheezing.  No dysuria, oliguria or hematuria or flank pain.  Upon presentation to the emergency room, temperature was 101.1 with a blood pressure 168/79 with heart rate of 117.  Labs revealed blood glucose of 368 and total protein of 9.1 with albumin 2.7.  CBC showed leukocytosis 13.3 with leukocytosis and anemia as well as thrombocytosis.  Influenza antigens and COVID-19 PCR came back negative.  UA showed more than 300 protein and more than 500 glucose with 20 of ketones and hyaline casts.  Blood cultures were drawn.  Two-view right foot x-ray showed erosive changes at the first MTP joint with soft tissue swelling consistent with osteomyelitis.  The patient was given 50 mcg of IV fentanyl, 1 L bolus of IV lactated Ringer and 1 g of IV vancomycin.  He will be admitted to a medical bed for further evaluation and management. PAST MEDICAL HISTORY:   Past Medical History:  Diagnosis Date  . Diabetes mellitus without complication (HCC)   . Glaucoma   . H/O degenerative disc disease     PAST SURGICAL HISTORY:   Past Surgical History:    Procedure Laterality Date  . HERNIA REPAIR      SOCIAL HISTORY:   Social History   Tobacco Use  . Smoking status: Never Smoker  . Smokeless tobacco: Never Used  Substance Use Topics  . Alcohol use: Yes    FAMILY HISTORY:  He denied any familial diseases.  DRUG ALLERGIES:  No Known Allergies  REVIEW OF SYSTEMS:   ROS As per history of present illness. All pertinent systems were reviewed above. Constitutional, HEENT, cardiovascular, respiratory, GI, GU, musculoskeletal, neuro, psychiatric, endocrine, integumentary and hematologic systems were reviewed and are otherwise negative/unremarkable except for positive findings mentioned above in the HPI.   MEDICATIONS AT HOME:   Prior to Admission medications   Medication Sig Start Date End Date Taking? Authorizing Provider  brimonidine (ALPHAGAN) 0.2 % ophthalmic solution Place 1 drop into both eyes 2 (two) times daily.    [provider]  cholecalciferol (VITAMIN D3) 25 MCG (1000 UT) tablet Take 1,000 Units by mouth daily.    [provider]  metFORMIN (GLUCOPHAGE) 500 MG tablet Take 1 tablet (500 mg total) by mouth 2 (two) times daily with a meal. 10/21/18 12/20/18  Miguel Aschoff., MD  Methylsulfonylmethane (MSM) 1000 MG CAPS Take 1 capsule by mouth daily.    [provider]  timolol (BETIMOL) 0.25 % ophthalmic solution Place 1 drop into both eyes 2 (two) times daily.  [provider]  TURMERIC PO Take 1 tablet by mouth daily.    [provider]  zinc gluconate 50 MG tablet Take 50 mg by mouth daily.    [provider]      VITAL SIGNS:  Blood pressure (!) 174/85, pulse (!) 108, temperature 99.9 F (37.7 C), temperature source Oral, resp. rate 19, height 6\' 2"  (1.88 m), weight 86.2 kg, SpO2 95 %.  PHYSICAL EXAMINATION:  Physical Exam  GENERAL:  59 y.o.-year-old acutely African-American male patient lying in the bed with no acute distress.  EYES: Pupils equal, round,  reactive to light and accommodation. No scleral icterus. Extraocular muscles intact.  HEENT: Head atraumatic, normocephalic. Oropharynx and nasopharynx clear.  NECK:  Supple, no jugular venous distention. No thyroid enlargement, no tenderness.  LUNGS: Normal breath sounds bilaterally, no wheezing, rales,rhonchi or crepitation. No use of accessory muscles of respiration.  CARDIOVASCULAR: Regular rate and rhythm, S1, S2 normal. No murmurs, rubs, or gallops.  ABDOMEN: Soft, nondistended, nontender. Bowel sounds present. No organomegaly or mass.  EXTREMITIES: No pedal edema, cyanosis, or clubbing.  NEUROLOGIC: Cranial nerves II through XII are intact. Muscle strength 5/5 in all extremities. Sensation intact. Gait not checked.  PSYCHIATRIC: The patient is alert and oriented x 3.  Normal affect and good eye contact. SKIN: Right foot and big toe swelling with first MTP erythema with induration warmth and tenderness.    LABORATORY PANEL:   CBC Recent Labs  Lab 11/05/19 1850  WBC 13.3*  HGB 10.7*  HCT 33.3*  PLT 621*   ------------------------------------------------------------------------------------------------------------------  Chemistries  Recent Labs  Lab 11/05/19 1850  NA 130*  K 3.9  CL 93*  CO2 23  GLUCOSE 368*  BUN 11  CREATININE 0.89  CALCIUM 8.2*  AST 13*  ALT 13  ALKPHOS 105  BILITOT 0.9   ------------------------------------------------------------------------------------------------------------------  Cardiac Enzymes No results for input(s): TROPONINI in the last 168 hours. ------------------------------------------------------------------------------------------------------------------  RADIOLOGY:  DG Foot 2 Views Right  Result Date: 11/05/2019 CLINICAL DATA:  Right foot pain and open wound, initial encounter EXAM: RIGHT FOOT - 2 VIEW COMPARISON:  None. FINDINGS: Significant degenerative changes are noted the first MTP joint. Some dystrophic calcification  is noted as well as erosive changes in the distal aspect of the first metatarsal. Associated soft tissue swelling is seen. These changes are consistent with osteomyelitis. Tarsal and calcaneal degenerative changes are seen. Lucency is also noted in the proximal aspect of the fourth metatarsal. This may be projectional in nature although the possibility of undisplaced fracture deserves consideration. IMPRESSION: Erosive changes at the first MTP joint with soft tissue swelling consistent with osteomyelitis. Electronically Signed   By: 01/05/2020 M.D.   On: 11/05/2019 19:19      IMPRESSION AND PLAN:   1.  Right big toe severe nonpurulent cellulitis and osteomyelitis involving the first MTP.The patient has subsequent sepsis as manifested by fever and tachycardia as well as leukocytosis, without severe sepsis or septic shock.  The patient has leg pain that could be referred pain and possibly radiculopathy related. -The patient will be admitted to a medical bed. -We will continue antibiotic therapy with IV cefepime and  vancomycin. -We will follow blood culture as well as wound culture. -Pain management will be provided. -Podiatry consult will be obtained. -I notified Dr. 01/05/2020 about the patient.  2.  Type 2 diabetes mellitus. -We will place the patient on supplement coverage with NovoLog. -We will hold off Metformin.  3.  Glaucoma. -We will  continue his Travatan and Timoptic ophthalmic gtts.  4.  DVT prophylaxis. -Subcutaneous Lovenox.  All the records are reviewed and case discussed with ED provider. The plan of care was discussed in details with the patient (and family). I answered all questions. The patient agreed to proceed with the above mentioned plan. Further management will depend upon hospital course.   CODE STATUS: Full code  Status is: Inpatient  Remains inpatient appropriate because:Ongoing active pain requiring inpatient pain management, Ongoing diagnostic testing needed  not appropriate for outpatient work up, Unsafe d/c plan, IV treatments appropriate due to intensity of illness or inability to take PO and Inpatient level of care appropriate due to severity of illness   Dispo: The patient is from: Home              Anticipated d/c is to: Home              Anticipated d/c date is: 2 days              Patient currently is not medically stable to d/c.      TOTAL TIME TAKING CARE OF THIS PATNT: 55 minutes.    Hannah Beat M.D on 11/05/2019 at 11:24 PM  [facility]   From 7 PM-7 AM, contact night-coverage [email or web address]  CC: Primary care physician; Patient, No Pcp Per

## 2019-11-05 NOTE — ED Provider Notes (Signed)
[facility] Emergency Department Provider Note   ____________________________________________   I have reviewed the triage vital signs and the nursing notes.   HISTORY  Chief Complaint Leg Pain   History limited by: Not Limited   HPI Gary Sutton is a 59 y.o. male who presents to the emergency department today because of concerns for right foot infection.  Patient states that he first noticed wound about 1 week ago.  Has bled intermittently since then.  Has been accompanied by swelling. The patient states that he has also had pain around his right hip flexor. The patient states that he did not have any trauma to his leg. Does have history of neuropathy.  The patient denies any fevers. Does have a history of diabetes.   Records reviewed. Per medical record review patient has a history of diabetes, glaucoma.   Past Medical History:  Diagnosis Date  . Diabetes mellitus without complication (HCC)   . Glaucoma   . H/O degenerative disc disease     There are no problems to display for this patient.   Past Surgical History:  Procedure Laterality Date  . HERNIA REPAIR      Prior to Admission medications   Medication Sig Start Date End Date Taking? Authorizing Provider  brimonidine (ALPHAGAN) 0.2 % ophthalmic solution Place 1 drop into both eyes 2 (two) times daily.    [provider]  cholecalciferol (VITAMIN D3) 25 MCG (1000 UT) tablet Take 1,000 Units by mouth daily.    [provider]  metFORMIN (GLUCOPHAGE) 500 MG tablet Take 1 tablet (500 mg total) by mouth 2 (two) times daily with a meal. 10/21/18 12/20/18  Miguel Aschoff., MD  Methylsulfonylmethane (MSM) 1000 MG CAPS Take 1 capsule by mouth daily.    [provider]  timolol (BETIMOL) 0.25 % ophthalmic solution Place 1 drop into both eyes 2 (two) times daily.     [provider]  TURMERIC PO Take 1 tablet by mouth daily.    [provider]  zinc gluconate 50  MG tablet Take 50 mg by mouth daily.    [provider]    Allergies Patient has no known allergies.  No family history on file.  Social History Social History   Tobacco Use  . Smoking status: Never Smoker  . Smokeless tobacco: Never Used  Vaping Use  . Vaping Use: Never used  Substance Use Topics  . Alcohol use: Yes  . Drug use: Never    Review of Systems Constitutional: No fever/chills Eyes: No visual changes. ENT: No sore throat. Cardiovascular: Denies chest pain. Respiratory: Denies shortness of breath. Gastrointestinal: No abdominal pain.  No nausea, no vomiting.  No diarrhea.   Genitourinary: Negative for dysuria. Musculoskeletal: Positive for back pain. Skin: Positive for wound to right foot.  Neurological: Negative for headaches, focal weakness or numbness.  ____________________________________________   PHYSICAL EXAM:  VITAL SIGNS: ED Triage Vitals  Enc Vitals Group     BP 11/05/19 1840 (!) 168/79     Pulse Rate 11/05/19 1840 (!) 117     Resp 11/05/19 1840 18     Temp 11/05/19 1840 (!) 101.1 F (38.4 C)     Temp Source 11/05/19 1840 Oral     SpO2 11/05/19 1840 98 %     Weight 11/05/19 1841 190 lb (86.2 kg)     Height 11/05/19 1841 6\' 2"  (1.88 m)     Head Circumference --      Peak Flow --  Pain Score 11/05/19 1840 3   Constitutional: Alert and oriented.  Eyes: Conjunctivae are normal.  ENT      Head: Normocephalic and atraumatic.      Nose: No congestion/rhinnorhea.      Mouth/Throat: Mucous membranes are moist.      Neck: No stridor. Hematological/Lymphatic/Immunilogical: No cervical lymphadenopathy. Cardiovascular: Normal rate, regular rhythm.  No murmurs, rubs, or gallops.  Respiratory: Normal respiratory effort without tachypnea nor retractions. Breath sounds are clear and equal bilaterally. No wheezes/rales/rhonchi. Gastrointestinal: Soft and non tender. No rebound. No guarding.  Genitourinary: Deferred Musculoskeletal:  Normal range of motion in all extremities. No lower extremity edema. Neurologic:  Normal speech and language. No gross focal neurologic deficits are appreciated.  Skin:  Positive for bloody wound Psychiatric: Mood and affect are normal. Speech and behavior are normal. Patient exhibits appropriate insight and judgment.  ____________________________________________    LABS (pertinent positives/negatives)  Lactic acid 1.2 CMP na 130, k 3.9, glu 368, cr 0.89 UA clear, small hgb dipstick >300 protein, 6-10 RBC, 0-5 wbc CBC wbc 13.3, hgb 10.7, plt 621 ____________________________________________   EKG  None  ____________________________________________    RADIOLOGY  Right foot Findings consistent with osteomyelitis of 1st mtp joint  ____________________________________________   PROCEDURES  Procedures  ____________________________________________   INITIAL IMPRESSION / ASSESSMENT AND PLAN / ED COURSE  Pertinent labs & imaging results that were available during my care of the patient were reviewed by me and considered in my medical decision making (see chart for details).   Patient presented to the emergency department today because of concerns for right foot pain and wound to his right foot.  On exam does have swelling and some bleeding to the base of the first digit.  Patient's initial vital signs were concerning for possible sepsis.  X-ray of the foot is consistent with osteomyelitis.  Discussed this finding with patient.  Will plan on admission for IV antibiotics further work-up and management.  ____________________________________________   FINAL CLINICAL IMPRESSION(S) / ED DIAGNOSES  Final diagnoses:  Osteomyelitis, unspecified site, unspecified type [facility])     Note: This dictation was prepared with Dragon dictation. Any transcriptional errors that result from this process are unintentional     Phineas Semen, MD 11/05/19 [phone]

## 2019-11-05 NOTE — Progress Notes (Signed)
Report from [place], [place]

## 2019-11-05 NOTE — ED Triage Notes (Signed)
Pt here with back pain and right leg pain. Pt has noted wound to right foot that is open, draining, and has a foul smell. Pt NAD in triage.

## 2019-11-06 ENCOUNTER — Inpatient Hospital Stay: Payer: Medicare Other

## 2019-11-06 DIAGNOSIS — M869 Osteomyelitis, unspecified: Secondary | ICD-10-CM | POA: Diagnosis not present

## 2019-11-06 DIAGNOSIS — R7881 Bacteremia: Secondary | ICD-10-CM | POA: Diagnosis not present

## 2019-11-06 DIAGNOSIS — B9561 Methicillin susceptible Staphylococcus aureus infection as the cause of diseases classified elsewhere: Secondary | ICD-10-CM

## 2019-11-06 DIAGNOSIS — L089 Local infection of the skin and subcutaneous tissue, unspecified: Secondary | ICD-10-CM

## 2019-11-06 DIAGNOSIS — D649 Anemia, unspecified: Secondary | ICD-10-CM | POA: Diagnosis not present

## 2019-11-06 LAB — BLOOD CULTURE ID PANEL (REFLEXED) - BCID2

## 2019-11-06 LAB — C DIFFICILE QUICK SCREEN W PCR REFLEX
C Diff antigen: NEGATIVE
C Diff interpretation: NOT DETECTED
C Diff toxin: NEGATIVE

## 2019-11-06 LAB — PROTIME-INR
INR: 1.4 — ABNORMAL HIGH (ref 0.8–1.2)
Prothrombin Time: 16.2 seconds — ABNORMAL HIGH (ref 11.4–15.2)

## 2019-11-06 LAB — CBC WITH DIFFERENTIAL/PLATELET
Abs Immature Granulocytes: 0.14 10*3/uL — ABNORMAL HIGH (ref 0.00–0.07)
Basophils Absolute: 0 10*3/uL (ref 0.0–0.1)
Basophils Relative: 0 %
Eosinophils Absolute: 0 10*3/uL (ref 0.0–0.5)
Eosinophils Relative: 0 %
HCT: 30.9 % — ABNORMAL LOW (ref 39.0–52.0)
Hemoglobin: 10 g/dL — ABNORMAL LOW (ref 13.0–17.0)
Immature Granulocytes: 1 %
Lymphocytes Relative: 9 %
Lymphs Abs: 1.3 10*3/uL (ref 0.7–4.0)
MCH: 24 pg — ABNORMAL LOW (ref 26.0–34.0)
MCHC: 32.4 g/dL (ref 30.0–36.0)
MCV: 74.1 fL — ABNORMAL LOW (ref 80.0–100.0)
Monocytes Absolute: 1.1 10*3/uL — ABNORMAL HIGH (ref 0.1–1.0)
Monocytes Relative: 7 %
Neutro Abs: 12.5 10*3/uL — ABNORMAL HIGH (ref 1.7–7.7)
Neutrophils Relative %: 83 %
Platelets: 552 10*3/uL — ABNORMAL HIGH (ref 150–400)
RBC: 4.17 MIL/uL — ABNORMAL LOW (ref 4.22–5.81)
RDW: 13.6 % (ref 11.5–15.5)
WBC: 15.2 10*3/uL — ABNORMAL HIGH (ref 4.0–10.5)
nRBC: 0 % (ref 0.0–0.2)

## 2019-11-06 LAB — GLUCOSE, CAPILLARY
Glucose-Capillary: 314 mg/dL — ABNORMAL HIGH (ref 70–99)
Glucose-Capillary: 322 mg/dL — ABNORMAL HIGH (ref 70–99)
Glucose-Capillary: 282 mg/dL — ABNORMAL HIGH (ref 70–99)
Glucose-Capillary: 258 mg/dL — ABNORMAL HIGH (ref 70–99)

## 2019-11-06 LAB — COMPREHENSIVE METABOLIC PANEL
ALT: 11 U/L (ref 0–44)
AST: 13 U/L — ABNORMAL LOW (ref 15–41)
Albumin: 2.2 g/dL — ABNORMAL LOW (ref 3.5–5.0)
Alkaline Phosphatase: 86 U/L (ref 38–126)
Anion gap: 11 (ref 5–15)
BUN: 11 mg/dL (ref 8–23)
CO2: 25 mmol/L (ref 22–32)
Calcium: 8.1 mg/dL — ABNORMAL LOW (ref 8.9–10.3)
Chloride: 95 mmol/L — ABNORMAL LOW (ref 98–111)
Creatinine, Ser: 0.9 mg/dL (ref 0.61–1.24)
GFR, Estimated: >60 mL/min (ref >60)
Glucose, Bld: 320 mg/dL — ABNORMAL HIGH (ref 70–99)
Potassium: 3.7 mmol/L (ref 3.5–5.1)
Sodium: 131 mmol/L — ABNORMAL LOW (ref 135–145)
Total Bilirubin: 1.1 mg/dL (ref 0.3–1.2)
Total Protein: 8.2 g/dL — ABNORMAL HIGH (ref 6.5–8.1)

## 2019-11-06 LAB — HEMOGLOBIN A1C
Hgb A1c MFr Bld: 12.6 % — ABNORMAL HIGH (ref 4.8–5.6)
Mean Plasma Glucose: 315 mg/dL

## 2019-11-06 LAB — LACTIC ACID, PLASMA: Lactic Acid, Venous: 1.1 mmol/L (ref 0.5–1.9)

## 2019-11-06 LAB — APTT: aPTT: 46 seconds — ABNORMAL HIGH (ref 24–36)

## 2019-11-06 MED ORDER — INSULIN ASPART 100 UNIT/ML ~~LOC~~ SOLN
0.0000 [IU] | Freq: Three times a day (TID) | SUBCUTANEOUS | Status: DC
  Administered 2019-11-06 (×2): 11 [IU] via SUBCUTANEOUS
  Administered 2019-11-06 – 2019-11-07 (×4): 8 [IU] via SUBCUTANEOUS
  Administered 2019-11-07: 5 [IU] via SUBCUTANEOUS
  Administered 2019-11-07 – 2019-11-08 (×4): 3 [IU] via SUBCUTANEOUS
  Administered 2019-11-09 – 2019-11-10 (×3): 2 [IU] via SUBCUTANEOUS
  Administered 2019-11-11: 3 [IU] via SUBCUTANEOUS
  Administered 2019-11-11 (×2): 2 [IU] via SUBCUTANEOUS
  Administered 2019-11-11: 3 [IU] via SUBCUTANEOUS
  Administered 2019-11-12: 5 [IU] via SUBCUTANEOUS
  Administered 2019-11-12: 3 [IU] via SUBCUTANEOUS
  Administered 2019-11-12: 2 [IU] via SUBCUTANEOUS
  Administered 2019-11-12 – 2019-11-13 (×2): 3 [IU] via SUBCUTANEOUS
  Filled 2019-11-06 (×21): qty 1

## 2019-11-06 MED ORDER — VANCOMYCIN HCL 1500 MG/300ML IV SOLN
1500.0000 mg | Freq: Two times a day (BID) | INTRAVENOUS | Status: DC
  Filled 2019-11-06 (×2): qty 300

## 2019-11-06 MED ORDER — SODIUM CHLORIDE 0.9 % IV SOLN
2.0000 g | Freq: Three times a day (TID) | INTRAVENOUS | Status: DC
  Administered 2019-11-06: 2 g via INTRAVENOUS
  Filled 2019-11-06: qty 2

## 2019-11-06 MED ORDER — CYCLOBENZAPRINE HCL 10 MG PO TABS
10.0000 mg | ORAL_TABLET | Freq: Three times a day (TID) | ORAL | Status: DC | PRN
  Administered 2019-11-06 – 2019-11-12 (×13): 10 mg via ORAL
  Filled 2019-11-06 (×14): qty 1

## 2019-11-06 MED ORDER — SODIUM CHLORIDE 0.9 % IV SOLN: 2.0000 g | Freq: Once | INTRAVENOUS | Status: DC

## 2019-11-06 MED ORDER — CEFAZOLIN SODIUM-DEXTROSE 2-4 GM/100ML-% IV SOLN
2.0000 g | Freq: Three times a day (TID) | INTRAVENOUS | Status: DC
  Administered 2019-11-06 – 2019-11-20 (×42): 2 g via INTRAVENOUS
  Filled 2019-11-06 (×49): qty 100

## 2019-11-06 MED ORDER — VANCOMYCIN HCL IN DEXTROSE 1-5 GM/200ML-% IV SOLN: 1000.0000 mg | Freq: Once | INTRAVENOUS | Status: DC

## 2019-11-06 MED ORDER — VANCOMYCIN HCL IN DEXTROSE 1-5 GM/200ML-% IV SOLN: 1000.0000 mg | Freq: Two times a day (BID) | INTRAVENOUS | Status: DC

## 2019-11-06 MED ORDER — GADOBUTROL 1 MMOL/ML IV SOLN
8.0000 mL | Freq: Once | INTRAVENOUS | Status: AC | PRN
  Administered 2019-11-06: 8 mL via INTRAVENOUS

## 2019-11-06 NOTE — Consult Note (Signed)
NAME: Gary Sutton  DOB: [birth year]  MRN: [id number]  Date/Time: 11/06/2019 12:23 PM  REQUESTING PROVIDER: Dr.Lai Subjective:  REASON FOR CONSULT: Staph bacteremia  Gary Sutton is a 59 y.o. with a history of Dm, DDD, glaucoma presented to the ED on 11/05/19 with rt foot swelling and pain rt leg X 1 day.   Pt says he has had foot issues for a couple of weeks- he is not sure how it started- He may have hurt his rt foot some time. But what brought him to the ED was the severe pain and tightness with numbness rt leg He says he has degenerative spine disease and at base line has numbness both calves and below . On sunday night he was watching foot ball  And then felt there was pain and tightness rt groin area. He had trouble walking. The next 2 days it got worse and also the numbness got worse and he called an Gary Sutton and came to the hospital yesterday. He did not relate this properly in the ED and as he had a discharging wound in the rt foot that became the focus . Pt has no PCP and is not engaged in medical care- [facility]. He does not know that he has diabetes. He has glaucoma left eye and had been using drops  In the ED temp 101.1, BPP 168/79 and HR 117. Labs showed Blood glucose was 368 , CBC 13.3, HB 10.7, PLT 621, Cr 0.89 Blood culture sent and started on vanco/cefepime . Foot x-ray showed erosive changes at the first MTP joint with soft tissue swelling consistent with osteomyelitis. I am seeing the patient for management of staph bacteremia Past Medical History:  Diagnosis Date  . Diabetes mellitus without complication ([place])   . Glaucoma   . H/O degenerative disc disease     Past Surgical History:  Procedure Laterality Date  . HERNIA REPAIR      SH Lives with his son On disability Non smoker Drinks wine l only on 3 days in a year    No family history on file. No Known Allergies  ? Current Facility-Administered Medications  Medication Dose  Route Frequency Provider Last Rate Last Admin  . 0.9 %  sodium chloride infusion   Intravenous Continuous Mansy, Jan A, MD 100 mL/hr at 11/06/19 0807 Rate Verify at 11/06/19 0807  . acetaminophen (TYLENOL) tablet 650 mg  650 mg Oral Q6H PRN Mansy, Jan A, MD       Or  . acetaminophen (TYLENOL) suppository 650 mg  650 mg Rectal Q6H PRN Mansy, Jan A, MD      . brimonidine (ALPHAGAN) 0.2 % ophthalmic solution 1 drop  1 drop Both Eyes BID Mansy, Jan A, MD      . ceFAZolin (ANCEF) IVPB 2g/100 mL premix  2 g Intravenous Q8H Ravishankar, Joellyn Quails, MD      . cholecalciferol (VITAMIN D3) tablet 1,000 Units  1,000 Units Oral Daily Mansy, Arvella Merles, MD   1,000 Units at 11/06/19 0944  . enoxaparin (LOVENOX) injection 40 mg  40 mg Subcutaneous Q24H Mansy, Jan A, MD   40 mg at 11/06/19 0944  . insulin aspart (novoLOG) injection 0-15 Units  0-15 Units Subcutaneous TID PC,HS,0200 Mansy, Arvella Merles, MD   11 Units at 11/06/19 0943  . magnesium hydroxide (MILK OF MAGNESIA) suspension 30 mL  30 mL Oral Daily PRN Mansy, Arvella Merles, MD      . morphine  2 MG/ML injection 2 mg  2 mg Intravenous Q2H PRN Mansy, Jan A, MD      . ondansetron [facility]) tablet 4 mg  4 mg Oral Q6H PRN Mansy, Jan A, MD       Or  . ondansetron [facility]) injection 4 mg  4 mg Intravenous Q6H PRN Mansy, Jan A, MD      . timolol (BETIMOL) 0.25 % ophthalmic solution 1 drop  1 drop Both Eyes BID Mansy, Jan A, MD      . traZODone (DESYREL) tablet 25 mg  25 mg Oral QHS PRN Mansy, Arvella Merles, MD       Current Outpatient Medications  Medication Sig Dispense Refill  . brimonidine (ALPHAGAN) 0.2 % ophthalmic solution Place 1 drop into both eyes 2 (two) times daily.    . cholecalciferol (VITAMIN D3) 25 MCG (1000 UT) tablet Take 1,000 Units by mouth daily.    . Methylsulfonylmethane (MSM) 1000 MG CAPS Take 1 capsule by mouth daily.    . timolol (BETIMOL) 0.25 % ophthalmic solution Place 1 drop into both eyes 2 (two) times daily.     . Travoprost, BAK Free, (TRAVATAN) 0.004 %  SOLN ophthalmic solution Place 1 drop into both eyes at bedtime.     . TURMERIC PO Take 1 tablet by mouth daily.    Marland Kitchen zinc gluconate 50 MG tablet Take 50 mg by mouth daily.    . metFORMIN (GLUCOPHAGE) 500 MG tablet Take 1 tablet (500 mg total) by mouth 2 (two) times daily with a meal. 120 tablet 0  . timolol (TIMOPTIC) 0.25 % ophthalmic solution  (Patient not taking: Reported on 11/06/2019)       Abtx:  Anti-infectives (From admission, onward)   Start     Dose/Rate Route Frequency Ordered Stop   11/06/19 1400  ceFAZolin (ANCEF) IVPB 2g/100 mL premix        2 g 200 mL/hr over 30 Minutes Intravenous Every 8 hours 11/06/19 0925     11/06/19 0900  vancomycin (VANCOCIN) IVPB 1000 mg/200 mL premix  Status:  Discontinued        1,000 mg 200 mL/hr over 60 Minutes Intravenous Every 12 hours 11/06/19 0110 11/06/19 0757   11/06/19 0900  vancomycin (VANCOREADY) IVPB 1500 mg/300 mL  Status:  Discontinued        1,500 mg 150 mL/hr over 120 Minutes Intravenous Every 12 hours 11/06/19 0757 11/06/19 0925   11/06/19 0800  ceFEPIme (MAXIPIME) 2 g in sodium chloride 0.9 % 100 mL IVPB  Status:  Discontinued        2 g 200 mL/hr over 30 Minutes Intravenous Every 8 hours 11/06/19 0107 11/06/19 0925   11/06/19 0230  ceFEPIme (MAXIPIME) 2 g in sodium chloride 0.9 % 100 mL IVPB  Status:  Discontinued        2 g 200 mL/hr over 30 Minutes Intravenous  Once 11/06/19 0228 11/06/19 0232   11/06/19 0230  vancomycin (VANCOCIN) IVPB 1000 mg/200 mL premix  Status:  Discontinued        1,000 mg 200 mL/hr over 60 Minutes Intravenous  Once 11/06/19 0228 11/06/19 0234   11/05/19 2345  ceFEPIme (MAXIPIME) 2 g in sodium chloride 0.9 % 100 mL IVPB        2 g 200 mL/hr over 30 Minutes Intravenous  Once 11/05/19 2324 11/06/19 0222   11/05/19 2330  vancomycin (VANCOCIN) IVPB 1000 mg/200 mL premix  Status:  Discontinued        1,000 mg  200 mL/hr over 60 Minutes Intravenous  Once 11/05/19 2324 11/05/19 2326   11/05/19 2115   vancomycin (VANCOCIN) IVPB 1000 mg/200 mL premix        1,000 mg 200 mL/hr over 60 Minutes Intravenous  Once 11/05/19 2112 11/05/19 2227      REVIEW OF SYSTEMS:  Const: negative fever, negative chills, negative weight loss Eyes: negative diplopia or visual changes, negative eye pain ENT: negative coryza, negative sore throat Resp: negative cough, hemoptysis, dyspnea Cards: negative for chest pain, palpitations, lower extremity edema GU: negative for frequency, dysuria and hematuria GI: Negative for abdominal pain, diarrhea, bleeding, constipation Skin: negative for rash and pruritus Heme: negative for easy bruising and gum/nose bleeding MS: as above Neurolo:numbness rt leg Psych: negative for feelings of anxiety, depression  Endocrine: negative for thyroid, diabetes Allergy/Immunology- negative for any medication or food allergies ? Objective:  VITALS:  BP (!) 170/84   Pulse (!) 108   Temp 98.5 F (36.9 C) (Oral)   Resp 17   Ht 6' 2" (1.88 m)   Wt 86.2 kg   SpO2 95%   BMI 24.39 kg/m  PHYSICAL EXAM:  General: Alert, cooperative, no distress, appears stated age.  Head: Normocephalic, without obvious abnormality, atraumatic. Eyes: Conjunctivae clear, anicteric sclerae. Pupils are equal ENT Nares normal. No drainage or sinus tenderness. Lips, mucosa, and tongue normal. No Thrush poor dentition  Neck: Supple, no carotid bruit and no JVD. Back: No CVA tenderness. Lungs: Clear to auscultation bilaterally. No Wheezing or Rhonchi. No rales. Heart: s1s2 Abdomen: Soft, non-tender,not distended. Bowel sounds normal. No masses Extremities: rt leg- painful SLR- unable to raise beyond 20  Both feet has dry, scaly skin Rt great toe- MTP area- discharging abscess  Skin: No rashes or lesions. Or bruising Lymph: Cervical, supraclavicular normal. Neurologic: did not examine in detail Pertinent Labs Lab Results CBC    Component Value Date/Time   WBC 15.2 (H) 11/06/2019 0450    RBC 4.17 (L) 11/06/2019 0450   HGB 10.0 (L) 11/06/2019 0450   HCT 30.9 (L) 11/06/2019 0450   PLT 552 (H) 11/06/2019 0450   MCV 74.1 (L) 11/06/2019 0450   MCH 24.0 (L) 11/06/2019 0450   MCHC 32.4 11/06/2019 0450   RDW 13.6 11/06/2019 0450   LYMPHSABS 1.3 11/06/2019 0450   MONOABS 1.1 (H) 11/06/2019 0450   EOSABS 0.0 11/06/2019 0450   BASOSABS 0.0 11/06/2019 0450    CMP Latest Ref Rng & Units 11/06/2019 11/05/2019 01/19/2019  Glucose 70 - 99 mg/dL 320(H) 368(H) 267(H)  BUN 8 - 23 mg/dL _0 Creatinine 0.61 - 1.24 mg/dL 0.90 0.89 0.89  Sodium 135 - 145 mmol/L 131(L) 130(L) 132(L)  Potassium 3.5 - 5.1 mmol/L 3.7 3.9 3.2(L)  Chloride 98 - 111 mmol/L 95(L) 93(L) 98  CO2 22 - 32 mmol/L 25 23 20(L)  Calcium 8.9 - 10.3 mg/dL 8.1(L) 8.2(L) 7.9(L)  Total Protein 6.5 - 8.1 g/dL 8.2(H) 9.1(H) 7.5  Total Bilirubin 0.3 - 1.2 mg/dL 1.1 0.9 0.8  Alkaline Phos 38 - 126 U/L 86 105 56  AST 15 - 41 U/L 13(L) 13(L) 21  ALT 0 - 44 U/L _1 Microbiology: Recent Results (from the past 240 hour(s))  Culture, blood (Routine x 2)     Status: None (Preliminary result)   Collection Time: 11/05/19  6:50 PM   Specimen: BLOOD  Result Value Ref Range Status   Specimen Description BLOOD BLOOD LEFT FOREARM  Final  Special Requests   Final    BOTTLES DRAWN AEROBIC AND ANAEROBIC Blood Culture adequate volume   Culture  Setup Time   Final    GRAM POSITIVE COCCI IN BOTH AEROBIC AND ANAEROBIC BOTTLES Organism ID to follow CRITICAL RESULT CALLED TO, READ BACK BY AND VERIFIED WITH: KAREN HAYES 11/06/19 0752 KLW Performed at [facility], [address] [place], [place] [address]    Culture GRAM POSITIVE COCCI  Final   Report Status PENDING  Incomplete  Blood Culture ID Panel (Reflexed)     Status: Abnormal   Collection Time: 11/05/19  6:50 PM  Result Value Ref Range Status   Enterococcus faecalis NOT DETECTED NOT DETECTED Final   Enterococcus Faecium NOT DETECTED NOT DETECTED  Final   Listeria monocytogenes NOT DETECTED NOT DETECTED Final   Staphylococcus species DETECTED (A) NOT DETECTED Final    Comment: CRITICAL RESULT CALLED TO, READ BACK BY AND VERIFIED WITH: KAREN HAYES 11/06/19 0752 KLW    Staphylococcus aureus (BCID) DETECTED (A) NOT DETECTED Final    Comment: CRITICAL RESULT CALLED TO, READ BACK BY AND VERIFIED WITH: KAREN HAYES 11/06/19 0752 KLW    Staphylococcus epidermidis NOT DETECTED NOT DETECTED Final   Staphylococcus lugdunensis NOT DETECTED NOT DETECTED Final   Streptococcus species NOT DETECTED NOT DETECTED Final   Streptococcus agalactiae NOT DETECTED NOT DETECTED Final   Streptococcus pneumoniae NOT DETECTED NOT DETECTED Final   Streptococcus pyogenes NOT DETECTED NOT DETECTED Final   A.calcoaceticus-baumannii NOT DETECTED NOT DETECTED Final   Bacteroides fragilis NOT DETECTED NOT DETECTED Final   Enterobacterales NOT DETECTED NOT DETECTED Final   Enterobacter cloacae complex NOT DETECTED NOT DETECTED Final   Escherichia coli NOT DETECTED NOT DETECTED Final   Klebsiella aerogenes NOT DETECTED NOT DETECTED Final   Klebsiella oxytoca NOT DETECTED NOT DETECTED Final   Klebsiella pneumoniae NOT DETECTED NOT DETECTED Final   Proteus species NOT DETECTED NOT DETECTED Final   Salmonella species NOT DETECTED NOT DETECTED Final   Serratia marcescens NOT DETECTED NOT DETECTED Final   Haemophilus influenzae NOT DETECTED NOT DETECTED Final   Neisseria meningitidis NOT DETECTED NOT DETECTED Final   Pseudomonas aeruginosa NOT DETECTED NOT DETECTED Final   Stenotrophomonas maltophilia NOT DETECTED NOT DETECTED Final   Candida albicans NOT DETECTED NOT DETECTED Final   Candida auris NOT DETECTED NOT DETECTED Final   Candida glabrata NOT DETECTED NOT DETECTED Final   Candida krusei NOT DETECTED NOT DETECTED Final   Candida parapsilosis NOT DETECTED NOT DETECTED Final   Candida tropicalis NOT DETECTED NOT DETECTED Final   Cryptococcus  neoformans/gattii NOT DETECTED NOT DETECTED Final   Meth resistant mecA/C and MREJ NOT DETECTED NOT DETECTED Final    Comment: Performed at [facility], [address] [place], [place] [address]  Culture, blood (Routine x 2)     Status: None (Preliminary result)   Collection Time: 11/05/19  8:47 PM   Specimen: BLOOD  Result Value Ref Range Status   Specimen Description BLOOD LEFT FA  Final   Special Requests   Final    BOTTLES DRAWN AEROBIC AND ANAEROBIC Blood Culture adequate volume   Culture  Setup Time   Final    GRAM POSITIVE COCCI IN BOTH AEROBIC AND ANAEROBIC BOTTLES CRITICAL VALUE NOTED.  VALUE IS CONSISTENT WITH PREVIOUSLY REPORTED AND CALLED VALUE. Performed at [facility], [address], [place], [place] [address]    Culture PENDING  Incomplete   Report Status PENDING  Incomplete  Respiratory  Panel by RT PCR (Flu A&B, Covid) - Nasopharyngeal Swab     Status: None   Collection Time: 11/05/19  9:27 PM   Specimen: Nasopharyngeal Swab  Result Value Ref Range Status   SARS Coronavirus 2 by RT PCR NEGATIVE NEGATIVE Final    Comment: (NOTE) SARS-CoV-2 target nucleic acids are NOT DETECTED.  The SARS-CoV-2 RNA is generally detectable in upper respiratoy specimens during the acute phase of infection. The lowest concentration of SARS-CoV-2 viral copies this assay can detect is 131 copies/mL. A negative result does not preclude SARS-Cov-2 infection and should not be used as the sole basis for treatment or other patient management decisions. A negative result may occur with  improper specimen collection/handling, submission of specimen other than nasopharyngeal swab, presence of viral mutation(s) within the areas targeted by this assay, and inadequate number of viral copies (<131 copies/mL). A negative result must be combined with clinical observations, patient history, and epidemiological information. The expected result is Negative.  Fact Sheet for  Patients:  PinkCheek.be  Fact Sheet for Healthcare Providers:  GravelBags.it  This test is no t yet approved or cleared by the [place] FDA and  has been authorized for detection and/or diagnosis of SARS-CoV-2 by FDA under an Emergency Use Authorization (EUA). This EUA will remain  in effect (meaning this test can be used) for the duration of the COVID-19 declaration under Section 564(b)(1) of the Act, 21 U.S.C. section 360bbb-3(b)(1), unless the authorization is terminated or revoked sooner.     Influenza A by PCR NEGATIVE NEGATIVE Final   Influenza B by PCR NEGATIVE NEGATIVE Final    Comment: (NOTE) The Xpert Xpress SARS-CoV-2/FLU/RSV assay is intended as an aid in  the diagnosis of influenza from Nasopharyngeal swab specimens and  should not be used as a sole basis for treatment. Nasal washings and  aspirates are unacceptable for Xpert Xpress SARS-CoV-2/FLU/RSV  testing.  Fact Sheet for Patients: PinkCheek.be  Fact Sheet for Healthcare Providers: GravelBags.it  This test is not yet approved or cleared by the [place] FDA and  has been authorized for detection and/or diagnosis of SARS-CoV-2 by  FDA under an Emergency Use Authorization (EUA). This EUA will remain  in effect (meaning this test can be used) for the duration of the  Covid-19 declaration under Section 564(b)(1) of the Act, 21  U.S.C. section 360bbb-3(b)(1), unless the authorization is  terminated or revoked. Performed at [facility], [address] [place], [place] [address]     IMAGING RESULTS:  I have personally reviewed the films ? Impression/Recommendation ? ?RT great toe infection with osteo MTP Need podiatry consult for surgical intervenion  Staph aureus bacteremia - DC vanco and cefepime and change to cefazolin  Rt hip, groin pain and numbness- need MRI of the  hip and lumbar spine to r/o infection   Anemia with increase in protein- rule out Multiple myeloma  DM- pt was not on any meds as he says he does not know that he has it. ?  Degenerative disc disease ___________________________________________________ Discussed with patient, requesting provider

## 2019-11-06 NOTE — Progress Notes (Signed)
PROGRESS NOTE    Gary Sutton  DUK:[id number] DOB: [birth year] DOA: 11/05/2019 PCP: Patient, No Pcp Per    Assessment & Plan:   Active Problems:   Toe osteomyelitis (HCC)   Gary Sutton  is a 59 y.o. male with a known history of type 2 diabetes mellitus, glaucoma and degenerative disc disease, who presented to the emergency room with acute onset of right lower extremity pain involving his leg with associated right foot swelling with erythema at the big toe.  The patient stated that he had pain around his right hip flexors and has not had any trauma.  He admitted to chills but did not check his temperature.  His right big toe has been swelling over the last week.  No chest pain or dyspnea or cough or wheezing.  No dysuria, oliguria or hematuria or flank pain.   # Sepsis 2/2  # Right big toe diabetic ulcer and osteomyelitis involving the first MTP. --fever and tachycardia as well as leukocytosis. --started on cefepime and  vancomycin. PLAN: --abx switched to cefazolin, per ID --podiatry consult  # Staph bacteremia --abx switched to cefazolin, per ID  # Low back pain, right hip and groin pain --given bacteremia, need to rule out infection in these areas --MRI lumbar spine, right hip   2.  Type 2 diabetes mellitus, poorly controlled -on home metformin. --A1c 12.6 --Start Levemir 10u BID --SSI  3.  Glaucoma. -We will continue his Travatan and Timoptic ophthalmic gtts   DVT prophylaxis: Lovenox SQ Code Status: Full code  Family Communication:  Status is: inpatient Dispo:   The patient is from: home Anticipated d/c is to: to be determined Anticipated d/c date is: >3 days Patient currently is not medically stable to d/c due to: bacteremia, osteomyelitis, need surgical resection.   Subjective and Interval History:  Pt complained of lower back pain and right thigh spasm.  Denied any pain in his right toe.     Objective: Vitals:   11/06/19 1623 11/06/19 1654 11/06/19 2157  11/06/19 2319  BP: (!) 160/74 (!) 154/75 129/64 (!) 154/77  Pulse: (!) 102 99 (!) 105 (!) 113  Resp: _0 Temp: 98.5 F (36.9 C) 100.3 F (37.9 C) (!) 100.4 F (38 C) 98.9 F (37.2 C)  TempSrc:  Oral    SpO2: 95% 95% 91% 94%  Weight:      Height:        Intake/Output Summary (Last 24 hours) at 11/07/2019 0145 Last data filed at 11/06/2019 2259 Gross per 24 hour  Intake 240 ml  Output 600 ml  Net -360 ml   Filed Weights   11/05/19 1841  Weight: 86.2 kg    Examination:   Constitutional: NAD, AAOx3 HEENT: conjunctivae and lids normal, EOMI CV: No cyanosis.   RESP: normal respiratory effort, on RA Extremities: right foot more swollen, some drainage from wound later to right great toe SKIN: warm.  Both legs have severely dry and peeling skin Neuro: II - XII grossly intact.   Psych: Normal mood and affect.      Data Reviewed: I have personally reviewed following labs and imaging studies  CBC: Recent Labs  Lab 11/05/19 1850 11/06/19 0450  WBC 13.3* 15.2*  NEUTROABS 11.5* 12.5*  HGB 10.7* 10.0*  HCT 33.3* 30.9*  MCV 74.5* 74.1*  PLT 621* 376*   Basic Metabolic Panel: Recent Labs  Lab 11/05/19 1850 11/06/19 0450  NA 130* 131*  K 3.9 3.7  CL 93*  95*  CO2 23 25  GLUCOSE 368* 320*  BUN 11 11  CREATININE 0.89 0.90  CALCIUM 8.2* 8.1*   GFR: Estimated Creatinine Clearance: 100.2 mL/min (by C-G formula based on SCr of 0.9 mg/dL). Liver Function Tests: Recent Labs  Lab 11/05/19 1850 11/06/19 0450  AST 13* 13*  ALT 13 11  ALKPHOS 105 86  BILITOT 0.9 1.1  PROT 9.1* 8.2*  ALBUMIN 2.7* 2.2*   No results for input(s): LIPASE, AMYLASE in the last 168 hours. No results for input(s): AMMONIA in the last 168 hours. Coagulation Profile: Recent Labs  Lab 11/05/19 1850 11/06/19 0450  INR 1.3* 1.4*   Cardiac Enzymes: No results for input(s): CKTOTAL, CKMB, CKMBINDEX, TROPONINI in the last 168 hours. BNP (last 3 results) No results for input(s):  PROBNP in the last 8760 hours. HbA1C: Recent Labs    11/06/19 0450  HGBA1C 12.6*   CBG: Recent Labs  Lab 11/06/19 0811 11/06/19 1214 11/06/19 1805 11/06/19 2206  GLUCAP 314* 322* 282* 258*   Lipid Profile: No results for input(s): CHOL, HDL, LDLCALC, TRIG, CHOLHDL, LDLDIRECT in the last 72 hours. Thyroid Function Tests: No results for input(s): TSH, T4TOTAL, FREET4, T3FREE, THYROIDAB in the last 72 hours. Anemia Panel: No results for input(s): VITAMINB12, FOLATE, FERRITIN, TIBC, IRON, RETICCTPCT in the last 72 hours. Sepsis Labs: Recent Labs  Lab 11/05/19 1850 11/06/19 0450  LATICACIDVEN 1.2 1.1    Recent Results (from the past 240 hour(s))  Culture, blood (Routine x 2)     Status: None (Preliminary result)   Collection Time: 11/05/19  6:50 PM   Specimen: BLOOD  Result Value Ref Range Status   Specimen Description BLOOD BLOOD LEFT FOREARM  Final   Special Requests   Final    BOTTLES DRAWN AEROBIC AND ANAEROBIC Blood Culture adequate volume   Culture  Setup Time   Final    GRAM POSITIVE COCCI IN BOTH AEROBIC AND ANAEROBIC BOTTLES Organism ID to follow CRITICAL RESULT CALLED TO, READ BACK BY AND VERIFIED WITH: Gary Sutton 11/06/19 0752 KLW Performed at [facility], [address] [place], [place] [address]    Culture GRAM POSITIVE COCCI  Final   Report Status PENDING  Incomplete  Blood Culture ID Panel (Reflexed)     Status: Abnormal   Collection Time: 11/05/19  6:50 PM  Result Value Ref Range Status   Enterococcus faecalis NOT DETECTED NOT DETECTED Final   Enterococcus Faecium NOT DETECTED NOT DETECTED Final   Listeria monocytogenes NOT DETECTED NOT DETECTED Final   Staphylococcus species DETECTED (A) NOT DETECTED Final    Comment: CRITICAL RESULT CALLED TO, READ BACK BY AND VERIFIED WITH: Gary Sutton 11/06/19 0752 KLW    Staphylococcus aureus (BCID) DETECTED (A) NOT DETECTED Final    Comment: CRITICAL RESULT CALLED TO, READ BACK BY AND VERIFIED  WITH: Gary Sutton 11/06/19 0752 KLW    Staphylococcus epidermidis NOT DETECTED NOT DETECTED Final   Staphylococcus lugdunensis NOT DETECTED NOT DETECTED Final   Streptococcus species NOT DETECTED NOT DETECTED Final   Streptococcus agalactiae NOT DETECTED NOT DETECTED Final   Streptococcus pneumoniae NOT DETECTED NOT DETECTED Final   Streptococcus pyogenes NOT DETECTED NOT DETECTED Final   A.calcoaceticus-baumannii NOT DETECTED NOT DETECTED Final   Bacteroides fragilis NOT DETECTED NOT DETECTED Final   Enterobacterales NOT DETECTED NOT DETECTED Final   Enterobacter cloacae complex NOT DETECTED NOT DETECTED Final   Escherichia coli NOT DETECTED NOT DETECTED Final   Klebsiella aerogenes NOT DETECTED NOT DETECTED Final  Klebsiella oxytoca NOT DETECTED NOT DETECTED Final   Klebsiella pneumoniae NOT DETECTED NOT DETECTED Final   Proteus species NOT DETECTED NOT DETECTED Final   Salmonella species NOT DETECTED NOT DETECTED Final   Serratia marcescens NOT DETECTED NOT DETECTED Final   Haemophilus influenzae NOT DETECTED NOT DETECTED Final   Neisseria meningitidis NOT DETECTED NOT DETECTED Final   Pseudomonas aeruginosa NOT DETECTED NOT DETECTED Final   Stenotrophomonas maltophilia NOT DETECTED NOT DETECTED Final   Candida albicans NOT DETECTED NOT DETECTED Final   Candida auris NOT DETECTED NOT DETECTED Final   Candida glabrata NOT DETECTED NOT DETECTED Final   Candida krusei NOT DETECTED NOT DETECTED Final   Candida parapsilosis NOT DETECTED NOT DETECTED Final   Candida tropicalis NOT DETECTED NOT DETECTED Final   Cryptococcus neoformans/gattii NOT DETECTED NOT DETECTED Final   Meth resistant mecA/C and MREJ NOT DETECTED NOT DETECTED Final    Comment: Performed at [facility], [address] [place], [place] [address]  Culture, blood (Routine x 2)     Status: None (Preliminary result)   Collection Time: 11/05/19  8:47 PM   Specimen: BLOOD  Result Value Ref Range Status    Specimen Description   Final    BLOOD LEFT FA Performed at [facility], [address] [place], [place] [address]    Special Requests   Final    BOTTLES DRAWN AEROBIC AND ANAEROBIC Blood Culture adequate volume Performed at [facility], [address] [place], [place] [address]    Culture  Setup Time   Final    GRAM POSITIVE COCCI IN BOTH AEROBIC AND ANAEROBIC BOTTLES CRITICAL VALUE NOTED.  VALUE IS CONSISTENT WITH PREVIOUSLY REPORTED AND CALLED VALUE. Performed at [facility], [address] [place], [place] [address]    Culture [facility] POSITIVE COCCI  Final   Report Status PENDING  Incomplete  Respiratory Panel by RT PCR (Flu A&B, Covid) - Nasopharyngeal Swab     Status: None   Collection Time: 11/05/19  9:27 PM   Specimen: Nasopharyngeal Swab  Result Value Ref Range Status   SARS Coronavirus 2 by RT PCR NEGATIVE NEGATIVE Final    Comment: (NOTE) SARS-CoV-2 target nucleic acids are NOT DETECTED.  The SARS-CoV-2 RNA is generally detectable in upper respiratoy specimens during the acute phase of infection. The lowest concentration of SARS-CoV-2 viral copies this assay can detect is 131 copies/mL. A negative result does not preclude SARS-Cov-2 infection and should not be used as the sole basis for treatment or other patient management decisions. A negative result may occur with  improper specimen collection/handling, submission of specimen other than nasopharyngeal swab, presence of viral mutation(s) within the areas targeted by this assay, and inadequate number of viral copies (<131 copies/mL). A negative result must be combined with clinical observations, patient history, and epidemiological information. The expected result is Negative.  Fact Sheet for Patients:  PinkCheek.be  Fact Sheet for Healthcare Providers:  GravelBags.it  This test is no t yet approved or cleared by the [place] FDA and  has been authorized for detection and/or diagnosis of SARS-CoV-2 by FDA under an Emergency Use Authorization (EUA). This EUA will remain  in effect (meaning this test can be used) for the duration of the COVID-19 declaration under Section 564(b)(1) of the Act, 21 U.S.C. section 360bbb-3(b)(1), unless the authorization is terminated or revoked sooner.     Influenza A by PCR NEGATIVE NEGATIVE Final   Influenza B by PCR NEGATIVE NEGATIVE  Final    Comment: (NOTE) The Xpert Xpress SARS-CoV-2/FLU/RSV assay is intended as an aid in  the diagnosis of influenza from Nasopharyngeal swab specimens and  should not be used as a sole basis for treatment. Nasal washings and  aspirates are unacceptable for Xpert Xpress SARS-CoV-2/FLU/RSV  testing.  Fact Sheet for Patients: PinkCheek.be  Fact Sheet for Healthcare Providers: GravelBags.it  This test is not yet approved or cleared by the [place] FDA and  has been authorized for detection and/or diagnosis of SARS-CoV-2 by  FDA under an Emergency Use Authorization (EUA). This EUA will remain  in effect (meaning this test can be used) for the duration of the  Covid-19 declaration under Section 564(b)(1) of the Act, 21  U.S.C. section 360bbb-3(b)(1), unless the authorization is  terminated or revoked. Performed at [facility], [place], [place] [address]   C Difficile Quick Screen w PCR reflex     Status: None   Collection Time: 11/06/19  9:48 AM   Specimen: STOOL  Result Value Ref Range Status   C Diff antigen NEGATIVE NEGATIVE Final   C Diff toxin NEGATIVE NEGATIVE Final   C Diff interpretation No C. difficile detected.  Final    Comment: Performed at [facility], [address] [place], [place] [address]  Aerobic/Anaerobic Culture (surgical/deep wound)     Status: None (Preliminary result)   Collection Time: 11/06/19  5:55 PM    Specimen: Toe  Result Value Ref Range Status   Specimen Description   Final    TOE LEFT GREAT TOE Performed at [facility], [address] [place], [place] [address]    Special Requests   Final    NONE Performed at [facility], [address] [place], [place] [address]    Gram Stain   Final    RARE WBC PRESENT,BOTH PMN AND MONONUCLEAR RARE GRAM POSITIVE COCCI Performed at [facility], [place] [address] [place],  [address]    Culture PENDING  Incomplete   Report Status PENDING  Incomplete      Radiology Studies: MR Lumbar Spine W Wo Contrast  Result Date: 11/06/2019 CLINICAL DATA:  Back pain and bacteremia EXAM: MRI LUMBAR SPINE WITHOUT AND WITH CONTRAST TECHNIQUE: Multiplanar and multiecho pulse sequences of the lumbar spine were obtained without and with intravenous contrast. CONTRAST:  32m GADAVIST GADOBUTROL 1 MMOL/ML IV SOLN COMPARISON:  None. FINDINGS: Segmentation:  Standard Alignment:  Physiologic. Vertebrae: Nonspecific heterogeneous bone marrow signal. No abnormal contrast enhancement. Conus medullaris and cauda equina: Conus extends to the L1 level. Conus and cauda equina appear normal. Paraspinal and other soft tissues: Negative Disc levels: T12-L1: Normal. L1-L2: Ankylosis. Normal disc. No spinal canal stenosis. No neural foraminal stenosis. L2-L3: Ankylosis. Normal disc. No spinal canal stenosis. No neural foraminal stenosis. L3-L4: Ankylosis. Normal disc. No spinal canal stenosis. No neural foraminal stenosis. L4-L5: Ankylosis. Small central disc protrusion. Left lateral recess narrowing without central spinal canal stenosis. No neural foraminal stenosis. L5-S1: Ankylosis. No disc herniation. No spinal canal stenosis. No neural foraminal stenosis. Visualized sacrum: Ankylosis of both sacroiliac joints. IMPRESSION: 1. No epidural abscess or discitis-osteomyelitis. 2. 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 3. Mild L4-L5 left lateral recess stenosis secondary to small central disc protrusion, a potential source of L5 radiculopathy. 4. Diffusely heterogeneous bone marrow signal is nonspecific and may be seen in chronic anemia, obesity, long-term smoking or in marrow replacement processes  such as multiple myeloma Electronically Signed   By: Ulyses Jarred M.D.   On: 11/06/2019 22:10   DG Foot 2 Views Right  Result Date: 11/05/2019 CLINICAL DATA:  Right foot pain and open wound, initial encounter EXAM: RIGHT FOOT - 2 VIEW COMPARISON:  None. FINDINGS: Significant degenerative changes are noted the first MTP joint. Some dystrophic calcification is noted as well as erosive changes in the distal aspect of the first metatarsal. Associated soft tissue swelling is seen. These changes are consistent with osteomyelitis. Tarsal and calcaneal degenerative changes are seen. Lucency is also noted in the proximal aspect of the fourth metatarsal. This may be projectional in nature although the possibility of undisplaced fracture deserves consideration. IMPRESSION: Erosive changes at the first MTP joint with soft tissue swelling consistent with osteomyelitis. Electronically Signed   By: Inez Catalina M.D.   On: 11/05/2019 19:19   DG FEMUR PORT, MIN 2 VIEWS RIGHT  Result Date: 11/06/2019 CLINICAL DATA:  Bacteremia. Right foot osteomyelitis. Right hip and right lower extremity pain. No reported injury. EXAM: RIGHT FEMUR PORTABLE 2 VIEW COMPARISON:  None. FINDINGS: No fracture. No osseous erosions. Moderate right hip osteoarthritis. No suspicious focal osseous lesions. No dislocation at the right hip or right knee on these views. No radiopaque foreign bodies. IMPRESSION: Moderate right hip osteoarthritis. No acute osseous abnormality in the right femur. Electronically Signed   By: Ilona Sorrel M.D.   On: 11/06/2019 16:14     Scheduled Meds: . brimonidine  1 drop Both Eyes BID  . cholecalciferol  1,000  Units Oral Daily  . enoxaparin (LOVENOX) injection  40 mg Subcutaneous Q24H  . insulin aspart  0-15 Units Subcutaneous TID PC,HS,0200  . timolol  1 drop Both Eyes BID   Continuous Infusions: . sodium chloride 100 mL/hr at 11/06/19 1725  .  ceFAZolin (ANCEF) IV 2 g (11/06/19 2310)     LOS: 2 days     Enzo Bi, MD [facility] If 7PM-7AM, please contact night-coverage 11/07/2019, 1:45 AM

## 2019-11-06 NOTE — Progress Notes (Signed)
Pharmacy Antibiotic Note  Gary Sutton is a 59 y.o. male admitted on 11/05/2019 with cellulitis.  Pharmacy has been consulted for Vancomycin and Cefepime dosing.  Plan: Cefepime 2gm IV q8hrs Vancomycin 1gm IV q12hrs (per nomogram)  Height: 6\' 2"  (188 cm) Weight: 86.2 kg (190 lb) IBW/kg (Calculated) : 82.2  Temp (24hrs), Avg:100.5 F (38.1 C), Min:99.9 F (37.7 C), Max:101.1 F (38.4 C)  Recent Labs  Lab 11/05/19 1850  WBC 13.3*  CREATININE 0.89  LATICACIDVEN 1.2    Estimated Creatinine Clearance: 101.3 mL/min (by C-G formula based on SCr of 0.89 mg/dL).    No Known Allergies  Antimicrobials this admission:   >>    >>   Dose adjustments this admission:   Microbiology results:  BCx:   UCx:    Sputum:    MRSA PCR:   Thank you for allowing pharmacy to be a part of this patient's care.  01/05/20 A 11/06/2019 1:10 AM

## 2019-11-06 NOTE — ED Notes (Signed)
Pt given breakfast tray

## 2019-11-06 NOTE — Progress Notes (Signed)
Pharmacy Antibiotic Note  Gary Sutton is a 60 y.o. male with medical history including diabetes admitted on 11/05/2019 with R big toe severe nonpurulent cellulitis and  osteomyelitis. Vitals with hypertension, tachycardia, fever. Podiatry has been consulted. Pharmacy has been consulted for vancomycin and cefepime dosing.   Today, 11/06/19  --Blood cultures currently with MSSA, 1/2 sets  Plan: Cefepime 2gm IV q8h  Will increase vancomycin to 1500 mg q12h --Goal trough 15-20 mcg/mL for osteomyelitis / possible bacteremia --Levels at steady state as indicated --Daily Scr per protocol  Continue to follow up cultures and ability to narrow antibiotics  Height: 6\' 2"  (188 cm) Weight: 86.2 kg (190 lb) IBW/kg (Calculated) : 82.2  Temp (24hrs), Avg:99.8 F (37.7 C), Min:98.5 F (36.9 C), Max:101.1 F (38.4 C)  Recent Labs  Lab 11/05/19 1850 11/06/19 0450  WBC 13.3* 15.2*  CREATININE 0.89 0.90  LATICACIDVEN 1.2 1.1    Estimated Creatinine Clearance: 100.2 mL/min (by C-G formula based on SCr of 0.9 mg/dL).    No Known Allergies  Antimicrobials this admission: Cefepime 10/12  >>  Vancomycin 10/12  >>   Dose adjustments this admission: 10/13: Vancomycin increased to 1500 mg q12h from 1000 mg q12h to target higher trough for osteomyelitis / possible bacteremia  Microbiology results: 10/12 BCx: 2/4 bottles (1/2 sets) GPC 10/12 BCID: MSSA 10/12 Wound cultures: pending  Thank you for allowing pharmacy to be a part of this patient's care.  12/12 11/06/2019 7:58 AM

## 2019-11-06 NOTE — Consult Note (Signed)
ORTHOPAEDIC CONSULTATION  REQUESTING PHYSICIAN: Darlin Priestly, MD  Chief Complaint: Right great toe joint infection  HPI: Gary Sutton is a 59 y.o. male who complains of infection to his right great toe joint.  Admitted with fever chills and sepsis.  Found to have positive blood cultures.  Noted drainage from his right great toe.  X-ray shows destructive changes.  Consult for possible surgical I&D.  Past Medical History:  Diagnosis Date  . Diabetes mellitus without complication (HCC)   . Glaucoma   . H/O degenerative disc disease    Past Surgical History:  Procedure Laterality Date  . HERNIA REPAIR     Social History   Socioeconomic History  . Marital status: Married    Spouse name: Not on file  . Number of children: Not on file  . Years of education: Not on file  . Highest education level: Not on file  Occupational History  . Not on file  Tobacco Use  . Smoking status: Never Smoker  . Smokeless tobacco: Never Used  Vaping Use  . Vaping Use: Never used  Substance and Sexual Activity  . Alcohol use: Yes  . Drug use: Never  . Sexual activity: Not on file  Other Topics Concern  . Not on file  Social History Narrative  . Not on file   Social Determinants of Health   Financial Resource Strain:   . Difficulty of Paying Living Expenses: Not on file  Food Insecurity:   . Worried About Programme researcher, broadcasting/film/video in the Last Year: Not on file  . Ran Out of Food in the Last Year: Not on file  Transportation Needs:   . Lack of Transportation (Medical): Not on file  . Lack of Transportation (Non-Medical): Not on file  Physical Activity:   . Days of Exercise per Week: Not on file  . Minutes of Exercise per Session: Not on file  Stress:   . Feeling of Stress : Not on file  Social Connections:   . Frequency of Communication with Friends and Family: Not on file  . Frequency of Social Gatherings with Friends and Family: Not on file  . Attends Religious Services: Not on file  .  Active Member of Clubs or Organizations: Not on file  . Attends Banker Meetings: Not on file  . Marital Status: Not on file   No family history on file. No Known Allergies Prior to Admission medications   Medication Sig Start Date End Date Taking? Authorizing Provider  brimonidine (ALPHAGAN) 0.2 % ophthalmic solution Place 1 drop into both eyes 2 (two) times daily.   Yes [provider]  cholecalciferol (VITAMIN D3) 25 MCG (1000 UT) tablet Take 1,000 Units by mouth daily.   Yes [provider]  Methylsulfonylmethane (MSM) 1000 MG CAPS Take 1 capsule by mouth daily.   Yes [provider]  timolol (BETIMOL) 0.25 % ophthalmic solution Place 1 drop into both eyes 2 (two) times daily.    Yes [provider]  Travoprost, BAK Free, (TRAVATAN) 0.004 % SOLN ophthalmic solution Place 1 drop into both eyes at bedtime.  11/05/19  Yes [provider]  TURMERIC PO Take 1 tablet by mouth daily.   Yes [provider]  zinc gluconate 50 MG tablet Take 50 mg by mouth daily.   Yes [provider]  metFORMIN (GLUCOPHAGE) 500 MG tablet Take 1 tablet (500 mg total) by mouth 2 (two) times daily with a meal. 10/21/18 12/20/18  Paschal Dopp  L., MD  timolol (TIMOPTIC) 0.25 % ophthalmic solution  10/21/19   [provider]   DG Foot 2 Views Right  Result Date: 11/05/2019 CLINICAL DATA:  Right foot pain and open wound, initial encounter EXAM: RIGHT FOOT - 2 VIEW COMPARISON:  None. FINDINGS: Significant degenerative changes are noted the first MTP joint. Some dystrophic calcification is noted as well as erosive changes in the distal aspect of the first metatarsal. Associated soft tissue swelling is seen. These changes are consistent with osteomyelitis. Tarsal and calcaneal degenerative changes are seen. Lucency is also noted in the proximal aspect of the fourth metatarsal. This may be projectional in nature although the possibility of  undisplaced fracture deserves consideration. IMPRESSION: Erosive changes at the first MTP joint with soft tissue swelling consistent with osteomyelitis. Electronically Signed   By: Alcide Clever M.D.   On: 11/05/2019 19:19   DG FEMUR PORT, MIN 2 VIEWS RIGHT  Result Date: 11/06/2019 CLINICAL DATA:  Bacteremia. Right foot osteomyelitis. Right hip and right lower extremity pain. No reported injury. EXAM: RIGHT FEMUR PORTABLE 2 VIEW COMPARISON:  None. FINDINGS: No fracture. No osseous erosions. Moderate right hip osteoarthritis. No suspicious focal osseous lesions. No dislocation at the right hip or right knee on these views. No radiopaque foreign bodies. IMPRESSION: Moderate right hip osteoarthritis. No acute osseous abnormality in the right femur. Electronically Signed   By: Delbert Phenix M.D.   On: 11/06/2019 16:14    Positive ROS: All other systems have been reviewed and were otherwise negative with the exception of those mentioned in the HPI and as above.  12 point ROS was performed.  Physical Exam: General: Alert and oriented.  No apparent distress.  Vascular:  Left foot:Dorsalis Pedis:  present Posterior Tibial:  present  Right foot: Dorsalis Pedis:  present Posterior Tibial:  present  Neuro:absent protective sensation  Derm: Left foot has a preulcerative area along his medial first MTPJ.  There is also a ulceration to his lateral fifth toe.  No exposure of bone at this time.  No purulence from this area.  Right foot with large ulceration with granulation tissue overlying the first MTPJ with purulent drainage and ulceration that probes directly to the bone and joint of his great toe joint at this time.  Noted diffuse edema to the right foot at this time.  Ortho/MS: Right foot with noted edema.  He is guarded with any attempted range of motion of the right lower extremity.  He is complaining of right hip and leg pain at this time.  Assessment: Diabetic ulceration with infection right  first MTPJ Osteomyelitis right great toe joint Sepsis with positive blood cultures.  Plan: X-rays are consistent with osteomyelitis.  At this point I had like to order an MRI to determine the extent of the infection at this time.  At minimum patient will need a partial first ray amputation.  I suspect we will have to remove a fair amount of the first ray.  I want to evaluate the surrounding joints as well as concern for further deep infection not seen on x-ray at this time.  A deep wound culture was performed by myself today.  We will follow-up tomorrow and plan for surgery likely Friday.    Irean Hong, DPM Cell [phone]   11/06/2019 5:52 PM

## 2019-11-06 NOTE — Progress Notes (Signed)
PHARMACIST - PHYSICIAN ORDER COMMUNICATION  CONCERNING: P&T Medication Policy on Herbal Medications  DESCRIPTION:  This patients order for:  MSM, Turmeric and zinc gluconate  has been noted.  These product(s) is classified as an herbal or natural product. Due to a lack of definitive safety studies or FDA approval, nonstandard manufacturing practices, plus the potential risk of unknown drug-drug interactions while on inpatient medications, the Pharmacy and Therapeutics Committee does not permit the use of herbal or natural products of this type within [facility].   ACTION TAKEN: The pharmacy department is unable to verify this order at this time. Please reevaluate patients clinical condition at discharge and address if the herbal or natural product(s) should be resumed at that time.  Bari Mantis PharmD Clinical Pharmacist 11/06/2019

## 2019-11-06 NOTE — Progress Notes (Signed)
PHARMACY - PHYSICIAN COMMUNICATION CRITICAL VALUE ALERT - BLOOD CULTURE IDENTIFICATION (BCID)  Gary Sutton is an 59 y.o. male who presented to [facility] on 11/05/2019 with a chief complaint of leg pain  Assessment: patient with DM, RLE pain with cellulitis of R big toe and X-ray showing evidence of OM.  10/12 Blood culture with GPC, BCID = MSSA.    Name of physician (or Provider) Contacted: Dr. Fran Lowes (also notify Dr. Rivka Safer of auto-ID ocnsult)  Current antibiotics: Vancomycin/Cefepime  Changes to prescribed antibiotics recommended:  Recommendations accepted by provider - per ID will narrow to cefazolin  Results for orders placed or performed during the hospital encounter of 11/05/19  Blood Culture ID Panel (Reflexed) (Collected: 11/05/2019  6:50 PM)  Result Value Ref Range   Enterococcus faecalis NOT DETECTED NOT DETECTED   Enterococcus Faecium NOT DETECTED NOT DETECTED   Listeria monocytogenes NOT DETECTED NOT DETECTED   Staphylococcus species DETECTED (A) NOT DETECTED   Staphylococcus aureus (BCID) DETECTED (A) NOT DETECTED   Staphylococcus epidermidis NOT DETECTED NOT DETECTED   Staphylococcus lugdunensis NOT DETECTED NOT DETECTED   Streptococcus species NOT DETECTED NOT DETECTED   Streptococcus agalactiae NOT DETECTED NOT DETECTED   Streptococcus pneumoniae NOT DETECTED NOT DETECTED   Streptococcus pyogenes NOT DETECTED NOT DETECTED   A.calcoaceticus-baumannii NOT DETECTED NOT DETECTED   Bacteroides fragilis NOT DETECTED NOT DETECTED   Enterobacterales NOT DETECTED NOT DETECTED   Enterobacter cloacae complex NOT DETECTED NOT DETECTED   Escherichia coli NOT DETECTED NOT DETECTED   Klebsiella aerogenes NOT DETECTED NOT DETECTED   Klebsiella oxytoca NOT DETECTED NOT DETECTED   Klebsiella pneumoniae NOT DETECTED NOT DETECTED   Proteus species NOT DETECTED NOT DETECTED   Salmonella species NOT DETECTED NOT DETECTED   Serratia marcescens NOT DETECTED NOT DETECTED    Haemophilus influenzae NOT DETECTED NOT DETECTED   Neisseria meningitidis NOT DETECTED NOT DETECTED   Pseudomonas aeruginosa NOT DETECTED NOT DETECTED   Stenotrophomonas maltophilia NOT DETECTED NOT DETECTED   Candida albicans NOT DETECTED NOT DETECTED   Candida auris NOT DETECTED NOT DETECTED   Candida glabrata NOT DETECTED NOT DETECTED   Candida krusei NOT DETECTED NOT DETECTED   Candida parapsilosis NOT DETECTED NOT DETECTED   Candida tropicalis NOT DETECTED NOT DETECTED   Cryptococcus neoformans/gattii NOT DETECTED NOT DETECTED   Meth resistant mecA/C and MREJ NOT DETECTED NOT DETECTED    Gary Sutton, PharmD, BCPS.   Work Cell: [phone] 11/06/2019 8:09 AM

## 2019-11-07 DIAGNOSIS — R7881 Bacteremia: Secondary | ICD-10-CM | POA: Diagnosis not present

## 2019-11-07 DIAGNOSIS — M869 Osteomyelitis, unspecified: Secondary | ICD-10-CM | POA: Diagnosis not present

## 2019-11-07 DIAGNOSIS — B9561 Methicillin susceptible Staphylococcus aureus infection as the cause of diseases classified elsewhere: Secondary | ICD-10-CM | POA: Diagnosis not present

## 2019-11-07 DIAGNOSIS — D649 Anemia, unspecified: Secondary | ICD-10-CM | POA: Diagnosis not present

## 2019-11-07 LAB — CBC
HCT: 27.4 % — ABNORMAL LOW (ref 39.0–52.0)
Hemoglobin: 9.2 g/dL — ABNORMAL LOW (ref 13.0–17.0)
MCH: 24.3 pg — ABNORMAL LOW (ref 26.0–34.0)
MCHC: 33.6 g/dL (ref 30.0–36.0)
MCV: 72.5 fL — ABNORMAL LOW (ref 80.0–100.0)
Platelets: 489 10*3/uL — ABNORMAL HIGH (ref 150–400)
RBC: 3.78 MIL/uL — ABNORMAL LOW (ref 4.22–5.81)
RDW: 13.6 % (ref 11.5–15.5)
WBC: 14.4 10*3/uL — ABNORMAL HIGH (ref 4.0–10.5)
nRBC: 0 % (ref 0.0–0.2)

## 2019-11-07 LAB — BASIC METABOLIC PANEL
Anion gap: 7 (ref 5–15)
BUN: 13 mg/dL (ref 8–23)
CO2: 28 mmol/L (ref 22–32)
Calcium: 7.6 mg/dL — ABNORMAL LOW (ref 8.9–10.3)
Chloride: 95 mmol/L — ABNORMAL LOW (ref 98–111)
Creatinine, Ser: 0.96 mg/dL (ref 0.61–1.24)
GFR, Estimated: >60 mL/min (ref >60)
Glucose, Bld: 310 mg/dL — ABNORMAL HIGH (ref 70–99)
Potassium: 3.5 mmol/L (ref 3.5–5.1)
Sodium: 130 mmol/L — ABNORMAL LOW (ref 135–145)

## 2019-11-07 LAB — GLUCOSE, CAPILLARY
Glucose-Capillary: 288 mg/dL — ABNORMAL HIGH (ref 70–99)
Glucose-Capillary: 248 mg/dL — ABNORMAL HIGH (ref 70–99)
Glucose-Capillary: 259 mg/dL — ABNORMAL HIGH (ref 70–99)
Glucose-Capillary: 196 mg/dL — ABNORMAL HIGH (ref 70–99)
Glucose-Capillary: 200 mg/dL — ABNORMAL HIGH (ref 70–99)

## 2019-11-07 LAB — HIV ANTIBODY (ROUTINE TESTING W REFLEX): HIV Screen 4th Generation wRfx: NONREACTIVE

## 2019-11-07 LAB — IRON AND TIBC
Iron: 10 ug/dL — ABNORMAL LOW (ref 45–182)
Saturation Ratios: 9 % — ABNORMAL LOW (ref 17.9–39.5)
TIBC: 113 ug/dL — ABNORMAL LOW (ref 250–450)
UIBC: 103 ug/dL

## 2019-11-07 LAB — CK: Total CK: 40 U/L — ABNORMAL LOW (ref 49–397)

## 2019-11-07 LAB — VITAMIN B12: Vitamin B-12: 138 pg/mL — ABNORMAL LOW (ref 180–914)

## 2019-11-07 LAB — SURGICAL PCR SCREEN
MRSA, PCR: NEGATIVE
Staphylococcus aureus: POSITIVE — AB

## 2019-11-07 LAB — MAGNESIUM: Magnesium: 1.5 mg/dL — ABNORMAL LOW (ref 1.7–2.4)

## 2019-11-07 LAB — FOLATE: Folate: 6.4 ng/mL (ref >5.9)

## 2019-11-07 MED ORDER — METRONIDAZOLE IN NACL 5-0.79 MG/ML-% IV SOLN
500.0000 mg | Freq: Three times a day (TID) | INTRAVENOUS | Status: DC
  Administered 2019-11-07 – 2019-11-18 (×33): 500 mg via INTRAVENOUS
  Filled 2019-11-07 (×37): qty 100

## 2019-11-07 MED ORDER — MAGNESIUM SULFATE 2 GM/50ML IV SOLN
2.0000 g | Freq: Once | INTRAVENOUS | Status: AC
  Administered 2019-11-07: 2 g via INTRAVENOUS
  Filled 2019-11-07: qty 50

## 2019-11-07 MED ORDER — TRAMADOL HCL 50 MG PO TABS
50.0000 mg | ORAL_TABLET | Freq: Four times a day (QID) | ORAL | Status: DC | PRN
  Administered 2019-11-07 – 2019-11-18 (×19): 50 mg via ORAL
  Filled 2019-11-07 (×19): qty 1

## 2019-11-07 MED ORDER — INSULIN STARTER KIT- PEN NEEDLES (ENGLISH)
1.0000 | Freq: Once | Status: AC
  Administered 2019-11-07: 1
  Filled 2019-11-07: qty 1

## 2019-11-07 MED ORDER — CHLORHEXIDINE GLUCONATE 4 % EX LIQD: 60.0000 mL | Freq: Once | CUTANEOUS | Status: DC

## 2019-11-07 MED ORDER — SODIUM CHLORIDE 0.9 % IV SOLN
INTRAVENOUS | Status: DC | PRN
  Administered 2019-11-07: 250 mL via INTRAVENOUS
  Administered 2019-11-09: 500 mL via INTRAVENOUS
  Administered 2019-11-09 – 2019-11-18 (×3): 250 mL via INTRAVENOUS
  Administered 2019-11-19: 100 mL via INTRAVENOUS

## 2019-11-07 MED ORDER — POVIDONE-IODINE 10 % EX SWAB: 2.0000 "application " | Freq: Once | CUTANEOUS | Status: DC

## 2019-11-07 MED ORDER — INSULIN DETEMIR 100 UNIT/ML ~~LOC~~ SOLN
10.0000 [IU] | Freq: Two times a day (BID) | SUBCUTANEOUS | Status: DC
  Administered 2019-11-07 – 2019-11-12 (×9): 10 [IU] via SUBCUTANEOUS
  Filled 2019-11-07 (×14): qty 0.1

## 2019-11-07 MED ORDER — INSULIN DETEMIR 100 UNIT/ML ~~LOC~~ SOLN
10.0000 [IU] | Freq: Two times a day (BID) | SUBCUTANEOUS | Status: DC
  Filled 2019-11-07: qty 0.1

## 2019-11-07 MED ORDER — LIVING WELL WITH DIABETES BOOK
Freq: Once | Status: AC
  Filled 2019-11-07: qty 1

## 2019-11-07 NOTE — Progress Notes (Signed)
ID  Says he has some pain and tightness rt thigh, groin area Patient Vitals for the past 24 hrs:  BP Temp Temp src Pulse Resp SpO2  11/07/19 0744 138/72 100.3 F (37.9 C) Oral 98 18 95 %  11/07/19 0254 136/76 99.1 F (37.3 C) Oral (!) 108 18 92 %  11/06/19 2319 (!) 154/77 98.9 F (37.2 C) -- (!) 113 14 94 %  11/06/19 2157 129/64 (!) 100.4 F (38 C) -- (!) 105 16 91 %  11/06/19 1654 (!) 154/75 100.3 F (37.9 C) Oral 99 17 95 %  11/06/19 1623 (!) 160/74 98.5 F (36.9 C) -- (!) 102 17 95 %    O/e awake and alert Chest b/l air entry [id number] abd soft Rt leg /groin buttock area painful  CBC Latest Ref Rng & Units 11/07/2019 11/06/2019 11/05/2019  WBC 4.0 - 10.5 K/uL 14.4(H) 15.2(H) 13.3(H)  Hemoglobin 13.0 - 17.0 g/dL 9.2(L) 10.0(L) 10.7(L)  Hematocrit 39 - 52 % 27.4(L) 30.9(L) 33.3(L)  Platelets 150 - 400 K/uL 489(H) 552(H) 621(H)    CMP Latest Ref Rng & Units 11/07/2019 11/06/2019 11/05/2019  Glucose 70 - 99 mg/dL 310(H) 320(H) 368(H)  BUN 8 - 23 mg/dL '13 11 11  ' Creatinine 0.61 - 1.24 mg/dL 0.96 0.90 0.89  Sodium 135 - 145 mmol/L 130(L) 131(L) 130(L)  Potassium 3.5 - 5.1 mmol/L 3.5 3.7 3.9  Chloride 98 - 111 mmol/L 95(L) 95(L) 93(L)  CO2 22 - 32 mmol/L '28 25 23  ' Calcium 8.9 - 10.3 mg/dL 7.6(L) 8.1(L) 8.2(L)  Total Protein 6.5 - 8.1 g/dL - 8.2(H) 9.1(H)  Total Bilirubin 0.3 - 1.2 mg/dL - 1.1 0.9  Alkaline Phos 38 - 126 U/L - 86 105  AST 15 - 41 U/L - 13(L) 13(L)  ALT 0 - 44 U/L - 11 13    Imaging MRI foot Soft tissue ulcer along the medial first MTP joint with sinus tract extending to the first metatarsal head. 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  MRI lumbar spine/hip No epidural abscess or discitis-osteomyelitis. 2. 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 3. Mild L4-L5 left lateral recess  stenosis secondary to small central disc protrusion, a potential source of L5 radiculopathy. 4. Diffusely heterogeneous bone marrow signal is nonspecific and may be seen in chronic anemia, obesity, long-term smoking or in marrow replacement processes such as multiple myeloma   MRI hip No evidence of osteomyelitis. No findings suggestive of septic arthritis. 2. Prominent edema in the right gluteus minimus muscle, concerning for myositis in the absence of trauma. 3. Moderate bilateral hip osteoarthritis. Trace right hip joint effusion with 7 mm intra-articular body. 4. Mild right greater than left greater trochanteric bursitis.   Impression/recommedation ?RT great toe infection with osteo MTP Podiatrist on board- planfor first ray amputation tomorrow  Staph aureus bacteremia - on cefazolin Will repeat blood culture and also get 2 d echo May need TEE  Will add flagyl for the foot infection to cover anerbes  Rt hip, groin pain and numbness-  MRI of the hip and lumbar spine- showed no osteo/ discitis Edema of the rt gluteal minus muscle ? Trauma ? myositis   Anemia with increase in protein- rule out Multiple myeloma especially with MRI findings of marrow replacement vertebrae  DM- pt was not on any meds as he says he does not know that he has it. ?  Degenerative disc disease  Dr.Fitzgerald will follow him tomorrow

## 2019-11-07 NOTE — Progress Notes (Signed)
PROGRESS NOTE    DERREON CONSALVO  CHY:[id number] DOB: [birth year] DOA: 11/05/2019 PCP: Patient, No Pcp Per    Assessment & Plan:   Active Problems:   Toe osteomyelitis (HCC)   Ziggy Chanthavong  is a 59 y.o. male with a known history of type 2 diabetes mellitus, glaucoma and degenerative disc disease, who presented to the emergency room with acute onset of right lower extremity pain involving his leg with associated right foot swelling with erythema at the big toe.  The patient stated that he had pain around his right hip flexors and has not had any trauma.  He admitted to chills but did not check his temperature.  His right big toe has been swelling over the last week.  No chest pain or dyspnea or cough or wheezing.  No dysuria, oliguria or hematuria or flank pain.   # Sepsis 2/2  # Right big toe diabetic ulcer and osteomyelitis involving the first MTP # Staph bacteremia --fever and tachycardia as well as leukocytosis on presentation. --started on cefepime and vancomycin and switched to cefazolin, per ID. PLAN: --continue cefazolin --Flagyl added by ID today --podiatry planned on surgical resection tomorrow, however, pt refused.  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  MRI lumbar spine, right hip showed the above findings. PLAN: --obtain CK --supportive care for now  2.  Type 2 diabetes mellitus, poorly controlled -on home metformin. --A1c 12.6 --start Levemir 10u BID --SSI  3.  Glaucoma. -continue his Travatan and Timoptic ophthalmic gtts   # Anemia, microcytic --anemia workup   DVT prophylaxis: Lovenox SQ Code Status: Full code  Family Communication: pt declined to have me update his family Status is: inpatient Dispo:   The patient is from: home Anticipated d/c is to: to be determined Anticipated  d/c date is: >3 days Patient currently is not medically stable to d/c due to: bacteremia, osteomyelitis, need surgical resection, currently not ready   Subjective and Interval History:  Pt complained of back pain not improved by Flexeril, so tramadol PRN added.  Pt was more lethargic today.    Podiatry offered surgery tomorrow, but pt refused, saying that he felt it's "too soon."   Objective: Vitals:   11/06/19 2157 11/06/19 2319 11/07/19 0254 11/07/19 0744  BP: 129/64 (!) 154/77 136/76 138/72  Pulse: (!) 105 (!) 113 (!) 108 98  Resp: '16 14 18 18  ' Temp: (!) 100.4 F (38 C) 98.9 F (37.2 C) 99.1 F (37.3 C) 100.3 F (37.9 C)  TempSrc:   Oral Oral  SpO2: 91% 94% 92% 95%  Weight:      Height:        Intake/Output Summary (Last 24 hours) at 11/07/2019 1631 Last data filed at 11/07/2019 1500 Gross per 24 hour  Intake 4024.71 ml  Output 200 ml  Net 3824.71 ml   Filed Weights   11/05/19 1841  Weight: 86.2 kg    Examination:   Constitutional: NAD, more lethargic, but arousable, and oriented HEENT: conjunctivae and lids normal, EOMI CV: RRR no M,R,G. Distal pulses +2.  No cyanosis.   RESP: CTA B/L, normal respiratory effort  GI: +BS, NTND Extremities: right LE more swollen, right foot wrapped SKIN: warm.  Severely dry and peeling skin on both legs Neuro: II - XII grossly intact.  Sensation intact   Data Reviewed:  I have personally reviewed following labs and imaging studies  CBC: Recent Labs  Lab 11/05/19 1850 11/06/19 0450 11/07/19 0416  WBC 13.3* 15.2* 14.4*  NEUTROABS 11.5* 12.5*  --   HGB 10.7* 10.0* 9.2*  HCT 33.3* 30.9* 27.4*  MCV 74.5* 74.1* 72.5*  PLT 621* 552* 289*   Basic Metabolic Panel: Recent Labs  Lab 11/05/19 1850 11/06/19 0450 11/07/19 0416  NA 130* 131* 130*  K 3.9 3.7 3.5  CL 93* 95* 95*  CO2 '23 25 28  ' GLUCOSE 368* 320* 310*  BUN '11 11 13  ' CREATININE 0.89 0.90 0.96  CALCIUM 8.2* 8.1* 7.6*  MG  --   --  1.5*   GFR: Estimated  Creatinine Clearance: 93.9 mL/min (by C-G formula based on SCr of 0.96 mg/dL). Liver Function Tests: Recent Labs  Lab 11/05/19 1850 11/06/19 0450  AST 13* 13*  ALT 13 11  ALKPHOS 105 86  BILITOT 0.9 1.1  PROT 9.1* 8.2*  ALBUMIN 2.7* 2.2*   No results for input(s): LIPASE, AMYLASE in the last 168 hours. No results for input(s): AMMONIA in the last 168 hours. Coagulation Profile: Recent Labs  Lab 11/05/19 1850 11/06/19 0450  INR 1.3* 1.4*   Cardiac Enzymes: Recent Labs  Lab 11/07/19 0416  CKTOTAL 40*   BNP (last 3 results) No results for input(s): PROBNP in the last 8760 hours. HbA1C: Recent Labs    11/06/19 0450  HGBA1C 12.6*   CBG: Recent Labs  Lab 11/06/19 1805 11/06/19 2206 11/07/19 0251 11/07/19 0741 11/07/19 1146  GLUCAP 282* 258* 288* 248* 259*   Lipid Profile: No results for input(s): CHOL, HDL, LDLCALC, TRIG, CHOLHDL, LDLDIRECT in the last 72 hours. Thyroid Function Tests: No results for input(s): TSH, T4TOTAL, FREET4, T3FREE, THYROIDAB in the last 72 hours. Anemia Panel: No results for input(s): VITAMINB12, FOLATE, FERRITIN, TIBC, IRON, RETICCTPCT in the last 72 hours. Sepsis Labs: Recent Labs  Lab 11/05/19 1850 11/06/19 0450  LATICACIDVEN 1.2 1.1    Recent Results (from the past 240 hour(s))  Culture, blood (Routine x 2)     Status: Abnormal (Preliminary result)   Collection Time: 11/05/19  6:50 PM   Specimen: BLOOD  Result Value Ref Range Status   Specimen Description   Final    BLOOD BLOOD LEFT FOREARM Performed at [facility], [address] [place], [place] [address]    Special Requests   Final    BOTTLES DRAWN AEROBIC AND ANAEROBIC Blood Culture adequate volume Performed at [facility], [address], [place], [place] [address]    Culture  Setup Time   Final    GRAM POSITIVE COCCI IN BOTH AEROBIC AND ANAEROBIC BOTTLES Organism ID to follow CRITICAL RESULT CALLED TO, READ BACK BY AND VERIFIED  WITH: KAREN HAYES 11/06/19 [place] Performed at [facility], [address] [place], [place] [address]    Culture STAPHYLOCOCCUS AUREUS (A)  Final   Report Status PENDING  Incomplete  Blood Culture ID Panel (Reflexed)     Status: Abnormal   Collection Time: 11/05/19  6:50 PM  Result Value Ref Range Status   Enterococcus faecalis NOT DETECTED NOT DETECTED Final   Enterococcus Faecium NOT DETECTED NOT DETECTED Final   Listeria monocytogenes NOT DETECTED NOT DETECTED Final   Staphylococcus species DETECTED (A) NOT DETECTED Final    Comment: CRITICAL RESULT CALLED TO, READ BACK BY AND VERIFIED WITH: KAREN HAYES 11/06/19 0752 KLW    Staphylococcus aureus (BCID) DETECTED (A) NOT DETECTED Final  Comment: CRITICAL RESULT CALLED TO, READ BACK BY AND VERIFIED WITH: KAREN HAYES 11/06/19 0752 KLW    Staphylococcus epidermidis NOT DETECTED NOT DETECTED Final   Staphylococcus lugdunensis NOT DETECTED NOT DETECTED Final   Streptococcus species NOT DETECTED NOT DETECTED Final   Streptococcus agalactiae NOT DETECTED NOT DETECTED Final   Streptococcus pneumoniae NOT DETECTED NOT DETECTED Final   Streptococcus pyogenes NOT DETECTED NOT DETECTED Final   A.calcoaceticus-baumannii NOT DETECTED NOT DETECTED Final   Bacteroides fragilis NOT DETECTED NOT DETECTED Final   Enterobacterales NOT DETECTED NOT DETECTED Final   Enterobacter cloacae complex NOT DETECTED NOT DETECTED Final   Escherichia coli NOT DETECTED NOT DETECTED Final   Klebsiella aerogenes NOT DETECTED NOT DETECTED Final   Klebsiella oxytoca NOT DETECTED NOT DETECTED Final   Klebsiella pneumoniae NOT DETECTED NOT DETECTED Final   Proteus species NOT DETECTED NOT DETECTED Final   Salmonella species NOT DETECTED NOT DETECTED Final   Serratia marcescens NOT DETECTED NOT DETECTED Final   Haemophilus influenzae NOT DETECTED NOT DETECTED Final   Neisseria meningitidis NOT DETECTED NOT DETECTED Final   Pseudomonas aeruginosa NOT  DETECTED NOT DETECTED Final   Stenotrophomonas maltophilia NOT DETECTED NOT DETECTED Final   Candida albicans NOT DETECTED NOT DETECTED Final   Candida auris NOT DETECTED NOT DETECTED Final   Candida glabrata NOT DETECTED NOT DETECTED Final   Candida krusei NOT DETECTED NOT DETECTED Final   Candida parapsilosis NOT DETECTED NOT DETECTED Final   Candida tropicalis NOT DETECTED NOT DETECTED Final   Cryptococcus neoformans/gattii NOT DETECTED NOT DETECTED Final   Meth resistant mecA/C and MREJ NOT DETECTED NOT DETECTED Final    Comment: Performed at [facility], [address] [place], [place] [address]  Culture, blood (Routine x 2)     Status: Abnormal (Preliminary result)   Collection Time: 11/05/19  8:47 PM   Specimen: BLOOD  Result Value Ref Range Status   Specimen Description   Final    BLOOD LEFT FA Performed at [facility], [address], [place], [place] [address]    Special Requests   Final    BOTTLES DRAWN AEROBIC AND ANAEROBIC Blood Culture adequate volume Performed at [facility], [address] [place], [place] [address]    Culture  Setup Time   Final    GRAM POSITIVE COCCI IN BOTH AEROBIC AND ANAEROBIC BOTTLES CRITICAL VALUE NOTED.  VALUE IS CONSISTENT WITH PREVIOUSLY REPORTED AND CALLED VALUE. Performed at [facility], [address] [place], [place] [address]    Culture STAPHYLOCOCCUS AUREUS (A)  Final   Report Status PENDING  Incomplete  Respiratory Panel by RT PCR (Flu A&B, Covid) - Nasopharyngeal Swab     Status: None   Collection Time: 11/05/19  9:27 PM   Specimen: Nasopharyngeal Swab  Result Value Ref Range Status   SARS Coronavirus 2 by RT PCR NEGATIVE NEGATIVE Final    Comment: (NOTE) SARS-CoV-2 target nucleic acids are NOT DETECTED.  The SARS-CoV-2 RNA is generally detectable in upper respiratoy specimens during the acute phase of infection. The lowest concentration of SARS-CoV-2 viral copies this assay can  detect is 131 copies/mL. A negative result does not preclude SARS-Cov-2 infection and should not be used as the sole basis for treatment or other patient management decisions. A negative result may occur with  improper specimen collection/handling, submission of specimen other than nasopharyngeal swab, presence of viral mutation(s) within the areas targeted by this assay, and inadequate number of viral copies (<131  copies/mL). A negative result must be combined with clinical observations, patient history, and epidemiological information. The expected result is Negative.  Fact Sheet for Patients:  PinkCheek.be  Fact Sheet for Healthcare Providers:  GravelBags.it  This test is no t yet approved or cleared by the [place] FDA and  has been authorized for detection and/or diagnosis of SARS-CoV-2 by FDA under an Emergency Use Authorization (EUA). This EUA will remain  in effect (meaning this test can be used) for the duration of the COVID-19 declaration under Section 564(b)(1) of the Act, 21 U.S.C. section 360bbb-3(b)(1), unless the authorization is terminated or revoked sooner.     Influenza A by PCR NEGATIVE NEGATIVE Final   Influenza B by PCR NEGATIVE NEGATIVE Final    Comment: (NOTE) The Xpert Xpress SARS-CoV-2/FLU/RSV assay is intended as an aid in  the diagnosis of influenza from Nasopharyngeal swab specimens and  should not be used as a sole basis for treatment. Nasal washings and  aspirates are unacceptable for Xpert Xpress SARS-CoV-2/FLU/RSV  testing.  Fact Sheet for Patients: PinkCheek.be  Fact Sheet for Healthcare Providers: GravelBags.it  This test is not yet approved or cleared by the [place] FDA and  has been authorized for detection and/or diagnosis of SARS-CoV-2 by  FDA under an Emergency Use Authorization (EUA). This EUA will remain  in  effect (meaning this test can be used) for the duration of the  Covid-19 declaration under Section 564(b)(1) of the Act, 21  U.S.C. section 360bbb-3(b)(1), unless the authorization is  terminated or revoked. Performed at [facility], [place], [place] [address]   C Difficile Quick Screen w PCR reflex     Status: None   Collection Time: 11/06/19  9:48 AM   Specimen: STOOL  Result Value Ref Range Status   C Diff antigen NEGATIVE NEGATIVE Final   C Diff toxin NEGATIVE NEGATIVE Final   C Diff interpretation No C. difficile detected.  Final    Comment: Performed at [facility], [address] [place], [place] [address]  Aerobic/Anaerobic Culture (surgical/deep wound)     Status: None (Preliminary result)   Collection Time: 11/06/19  5:55 PM   Specimen: Toe  Result Value Ref Range Status   Specimen Description   Final    TOE LEFT GREAT TOE Performed at [facility], [address], [place], [place] [address]    Special Requests   Final    NONE Performed at [facility], [address] [place], [place] [address]    Gram Stain   Final    RARE WBC PRESENT,BOTH PMN AND MONONUCLEAR RARE GRAM POSITIVE COCCI    Culture   Final    CULTURE REINCUBATED FOR BETTER GROWTH Performed at [facility], [address], [place], [place] [address]    Report Status PENDING  Incomplete      Radiology Studies: MR Lumbar Spine W Wo Contrast  Result Date: 11/06/2019 CLINICAL DATA:  Back pain and bacteremia EXAM: MRI LUMBAR SPINE WITHOUT AND WITH CONTRAST TECHNIQUE: Multiplanar and multiecho pulse sequences of the lumbar spine were obtained without and with intravenous contrast. CONTRAST:  12m GADAVIST GADOBUTROL 1 MMOL/ML IV SOLN COMPARISON:  None. FINDINGS: Segmentation:  Standard Alignment:  Physiologic. Vertebrae: Nonspecific heterogeneous bone marrow signal. No abnormal contrast enhancement. Conus medullaris and cauda equina: Conus  extends to the L1 level. Conus and cauda equina appear normal. Paraspinal and other soft tissues: Negative Disc levels: T12-L1: Normal. L1-L2: Ankylosis. Normal  disc. No spinal canal stenosis. No neural foraminal stenosis. L2-L3: Ankylosis. Normal disc. No spinal canal stenosis. No neural foraminal stenosis. L3-L4: Ankylosis. Normal disc. No spinal canal stenosis. No neural foraminal stenosis. L4-L5: Ankylosis. Small central disc protrusion. Left lateral recess narrowing without central spinal canal stenosis. No neural foraminal stenosis. L5-S1: Ankylosis. No disc herniation. No spinal canal stenosis. No neural foraminal stenosis. Visualized sacrum: Ankylosis of both sacroiliac joints. IMPRESSION: 1. No epidural abscess or discitis-osteomyelitis. 2. 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 3. Mild L4-L5 left lateral recess stenosis secondary to small central disc protrusion, a potential source of L5 radiculopathy. 4. Diffusely heterogeneous bone marrow signal is nonspecific and may be seen in chronic anemia, obesity, long-term smoking or in marrow replacement processes such as multiple myeloma Electronically Signed   By: Ulyses Jarred M.D.   On: 11/06/2019 22:10   MR HIP RIGHT W WO CONTRAST  Result Date: 11/07/2019 CLINICAL DATA:  Right hip pain.  Bacteremia. EXAM: MRI OF THE RIGHT HIP WITHOUT AND WITH CONTRAST TECHNIQUE: Multiplanar, multisequence MR imaging was performed both before and after administration of intravenous contrast. CONTRAST:  76m GADAVIST GADOBUTROL 1 MMOL/ML IV SOLN COMPARISON:  Right femur x-rays from same day. FINDINGS: Bones: There is no evidence of acute fracture, dislocation or avascular necrosis. Multiple hemangiomas involving the sacrum and bilateral iliac bones. Ankylosis of the bilateral sacroiliac joints. Normal pubic symphysis. Articular cartilage and labrum Articular cartilage: Scattered partial and full-thickness  cartilage loss in both hip joints with subchondral marrow edema and cystic change in the acetabula and bulky marginal acetabular osteophytes. Labrum: Degenerated right anterior superior labrum. Joint or bursal effusion Joint effusion: Trace right hip joint effusion with 7 mm intra-articular body (series 32, image 18). No synovial enhancement. Bursae: Small amount of fluid in the right greater than left greater trochanteric bursae. Muscles and tendons Muscles and tendons: Prominent edema in the right gluteus minimus muscle with enhancement. Lesser edema in the proximal vastus muscles without significant enhancement. No significant muscle atrophy. The visualized gluteus, hamstring and iliopsoas tendons are intact. Other findings Miscellaneous: Subcutaneous edema overlying the right hip, without enhancement. Multiple enlarged right inguinal and external iliac lymph nodes, likely reactive. The visualized internal pelvic contents appear unremarkable. Trace free fluid in the pelvis is nonspecific. IMPRESSION: 1. No evidence of osteomyelitis. No findings suggestive of septic arthritis. 2. Prominent edema in the right gluteus minimus muscle, concerning for myositis in the absence of trauma. 3. Moderate bilateral hip osteoarthritis. Trace right hip joint effusion with 7 mm intra-articular body. 4. Mild right greater than left greater trochanteric bursitis. Electronically Signed   By: WTitus DubinM.D.   On: 11/07/2019 08:32   MR FOOT RIGHT WO CONTRAST  Result Date: 11/07/2019 CLINICAL DATA:  Right great toe and foot swelling with redness. EXAM: MRI OF THE RIGHT FOREFOOT WITHOUT CONTRAST TECHNIQUE: Multiplanar, multisequence MR imaging of the right forefoot was performed. No intravenous contrast was administered. COMPARISON:  Right foot x-rays from yesterday. FINDINGS: Bones/Joint/Cartilage Extensive marrow edema with corresponding decreased T1 marrow signal involving the entire first proximal phalanx and majority of  the first metatarsal, sparing the base, as well as the medial and lateral hallux sesamoids. Bony destruction of the first metatarsal head and base of the first proximal phalanx. Small foci of low T1 and T2 signal within first proximal phalanx and first metatarsal head, concerning for gas. Large first MTP joint effusion. No fracture or dislocation. Ligaments The medial collateral ligament  at the first MTP joint is not well identified and likely destroyed. Remaining collateral ligaments are intact. Muscles and Tendons Flexor and extensor tendons are intact. No tenosynovitis. Increased T2 signal within in diffuse fatty atrophy of the intrinsic muscles of the forefoot, nonspecific, but likely related to diabetic muscle changes. Soft tissue Soft tissue ulcer along the medial first MTP joint with sinus tract extending to the first metatarsal head. No fluid collection or hematoma. No soft tissue mass. IMPRESSION: 1. Soft tissue ulcer along the medial first MTP joint with sinus tract extending to the first metatarsal head. 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 2. No abscess. Electronically Signed   By: Titus Dubin M.D.   On: 11/07/2019 08:40   DG Foot 2 Views Right  Result Date: 11/05/2019 CLINICAL DATA:  Right foot pain and open wound, initial encounter EXAM: RIGHT FOOT - 2 VIEW COMPARISON:  None. FINDINGS: Significant degenerative changes are noted the first MTP joint. Some dystrophic calcification is noted as well as erosive changes in the distal aspect of the first metatarsal. Associated soft tissue swelling is seen. These changes are consistent with osteomyelitis. Tarsal and calcaneal degenerative changes are seen. Lucency is also noted in the proximal aspect of the fourth metatarsal. This may be projectional in nature although the possibility of undisplaced fracture deserves consideration.  IMPRESSION: Erosive changes at the first MTP joint with soft tissue swelling consistent with osteomyelitis. Electronically Signed   By: Inez Catalina M.D.   On: 11/05/2019 19:19   DG FEMUR PORT, MIN 2 VIEWS RIGHT  Result Date: 11/06/2019 CLINICAL DATA:  Bacteremia. Right foot osteomyelitis. Right hip and right lower extremity pain. No reported injury. EXAM: RIGHT FEMUR PORTABLE 2 VIEW COMPARISON:  None. FINDINGS: No fracture. No osseous erosions. Moderate right hip osteoarthritis. No suspicious focal osseous lesions. No dislocation at the right hip or right knee on these views. No radiopaque foreign bodies. IMPRESSION: Moderate right hip osteoarthritis. No acute osseous abnormality in the right femur. Electronically Signed   By: Ilona Sorrel M.D.   On: 11/06/2019 16:14     Scheduled Meds: . brimonidine  1 drop Both Eyes BID  . chlorhexidine  60 mL Topical Once  . cholecalciferol  1,000 Units Oral Daily  . enoxaparin (LOVENOX) injection  40 mg Subcutaneous Q24H  . insulin aspart  0-15 Units Subcutaneous TID PC,HS,0200  . [START ON 11/08/2019] insulin detemir  10 Units Subcutaneous BID  . povidone-iodine  2 application Topical Once  . timolol  1 drop Both Eyes BID   Continuous Infusions: . sodium chloride 100 mL/hr at 11/06/19 1725  .  ceFAZolin (ANCEF) IV 2 g (11/07/19 1428)  . metronidazole       LOS: 2 days     Enzo Bi, MD [facility] If 7PM-7AM, please contact night-coverage 11/07/2019, 4:31 PM

## 2019-11-07 NOTE — Progress Notes (Addendum)
Daily Progress Note   Subjective  - * No surgery date entered *  Right great toe joint infection.  Patient status post MRI.  Objective Vitals:   11/06/19 2157 11/06/19 2319 11/07/19 0254 11/07/19 0744  BP: 129/64 (!) 154/77 136/76 138/72  Pulse: (!) 105 (!) 113 (!) 108 98  Resp: 16 14 18 18   Temp: (!) 100.4 F (38 C) 98.9 F (37.2 C) 99.1 F (37.3 C) 100.3 F (37.9 C)  TempSrc:   Oral Oral  SpO2: 91% 94% 92% 95%  Weight:      Height:        Physical Exam: MRI consistent with severe infection of the first MTPJ with destructive changes.  I personally reviewed the MRI that shows bony edema into the base of the first metatarsal.  No other signs of abscess or infection throughout the foot.  Laboratory CBC    Component Value Date/Time   WBC 14.4 (H) 11/07/2019 0416   HGB 9.2 (L) 11/07/2019 0416   HCT 27.4 (L) 11/07/2019 0416   PLT 489 (H) 11/07/2019 0416    BMET    Component Value Date/Time   NA 130 (L) 11/07/2019 0416   K 3.5 11/07/2019 0416   CL 95 (L) 11/07/2019 0416   CO2 28 11/07/2019 0416   GLUCOSE 310 (H) 11/07/2019 0416   BUN 13 11/07/2019 0416   CREATININE 0.96 11/07/2019 0416   CALCIUM 7.6 (L) 11/07/2019 0416   GFRNONAA >60 11/07/2019 0416   GFRAA >60 01/19/2019 0139    Assessment/Planning: Osteomyelitis right first MTPJ   I discussed with the patient need for surgical invention.  We will plan on partial first ray amputation of the right foot tomorrow.  Discussed the risk benefits alternatives and complications associated with surgery.  Patient gave consent to me verbally today.  Stated he would like to speak to his family.  We will plan for surgery tomorrow.  Orders have been placed.  01/21/2019 A  11/07/2019, 1:21 PM  Had discussion with patient today in regards to surgery.  Received word that he was refusing surgery at this time and just" did not want surgery right now."  Discussed patient is septic with obvious infection with osteomyelitis  on MRI and x-ray.  He has a draining abscess from his great toe joint at this time.  Discussed need for surgical intervention and this can be performed tomorrow.  Discussed any further delay will only cause further damage and delay in recovery.  Reinforced the absolute need for surgical resection of the infection.  We will keep patient on the OR schedule for tomorrow and keep orders in place.  If patient continues to refuse please let me know.

## 2019-11-08 ENCOUNTER — Encounter
Admission: EM | Disposition: A | Discharge status: Skilled Nursing Facility | Payer: Self-pay | Source: Home / Self Care | Attending: Hospitalist

## 2019-11-08 ENCOUNTER — Inpatient Hospital Stay
Admit: 2019-11-08 | Discharge: 2019-11-08 | Disposition: A | Discharge status: Home / Self Care | Payer: Medicare Other | Attending: Infectious Diseases | Admitting: Infectious Diseases

## 2019-11-08 DIAGNOSIS — M869 Osteomyelitis, unspecified: Secondary | ICD-10-CM | POA: Diagnosis not present

## 2019-11-08 LAB — ECHOCARDIOGRAM COMPLETE
AR max vel: 3.99 cm2
AV Area VTI: 3.81 cm2
AV Area mean vel: 4.05 cm2
AV Mean grad: 2 mmHg
AV Peak grad: 3.6 mmHg
Ao pk vel: 0.96 m/s
Area-P 1/2: 13.55 cm2
Height: 74 in
S' Lateral: 3.47 cm
Weight: 3040 oz

## 2019-11-08 LAB — BASIC METABOLIC PANEL
Anion gap: 11 (ref 5–15)
BUN: 12 mg/dL (ref 8–23)
CO2: 25 mmol/L (ref 22–32)
Calcium: 7.4 mg/dL — ABNORMAL LOW (ref 8.9–10.3)
Chloride: 94 mmol/L — ABNORMAL LOW (ref 98–111)
Creatinine, Ser: 0.91 mg/dL (ref 0.61–1.24)
GFR, Estimated: >60 mL/min (ref >60)
Glucose, Bld: 180 mg/dL — ABNORMAL HIGH (ref 70–99)
Potassium: 3.4 mmol/L — ABNORMAL LOW (ref 3.5–5.1)
Sodium: 130 mmol/L — ABNORMAL LOW (ref 135–145)

## 2019-11-08 LAB — CBC
HCT: 30.1 % — ABNORMAL LOW (ref 39.0–52.0)
Hemoglobin: 9.9 g/dL — ABNORMAL LOW (ref 13.0–17.0)
MCH: 24.1 pg — ABNORMAL LOW (ref 26.0–34.0)
MCHC: 32.9 g/dL (ref 30.0–36.0)
MCV: 73.4 fL — ABNORMAL LOW (ref 80.0–100.0)
Platelets: 556 10*3/uL — ABNORMAL HIGH (ref 150–400)
RBC: 4.1 MIL/uL — ABNORMAL LOW (ref 4.22–5.81)
RDW: 13.7 % (ref 11.5–15.5)
WBC: 14 10*3/uL — ABNORMAL HIGH (ref 4.0–10.5)
nRBC: 0 % (ref 0.0–0.2)

## 2019-11-08 LAB — CULTURE, BLOOD (ROUTINE X 2)
Special Requests: ADEQUATE
Special Requests: ADEQUATE

## 2019-11-08 LAB — GLUCOSE, CAPILLARY
Glucose-Capillary: 182 mg/dL — ABNORMAL HIGH (ref 70–99)
Glucose-Capillary: 153 mg/dL — ABNORMAL HIGH (ref 70–99)
Glucose-Capillary: 101 mg/dL — ABNORMAL HIGH (ref 70–99)
Glucose-Capillary: 90 mg/dL (ref 70–99)
Glucose-Capillary: 97 mg/dL (ref 70–99)

## 2019-11-08 LAB — MAGNESIUM: Magnesium: 1.9 mg/dL (ref 1.7–2.4)

## 2019-11-08 SURGERY — AMPUTATION, TOE
Anesthesia: Choice | Site: Toe | Laterality: Right

## 2019-11-08 MED ORDER — POTASSIUM CHLORIDE CRYS ER 20 MEQ PO TBCR
40.0000 meq | EXTENDED_RELEASE_TABLET | Freq: Once | ORAL | Status: AC
  Administered 2019-11-08: 40 meq via ORAL
  Filled 2019-11-08: qty 2

## 2019-11-08 SURGICAL SUPPLY — 43 items
BLADE OSC/SAGITTAL MD 5.5X18 (BLADE) ×2 IMPLANT
BLADE SURG MINI STRL (BLADE) ×2 IMPLANT
BNDG CONFORM 2 STRL LF (GAUZE/BANDAGES/DRESSINGS) ×2 IMPLANT
BNDG CONFORM 3 STRL LF (GAUZE/BANDAGES/DRESSINGS) ×4 IMPLANT
BNDG ELASTIC 4X5.8 VLCR NS LF (GAUZE/BANDAGES/DRESSINGS) ×2 IMPLANT
BNDG ESMARK 4X12 TAN STRL LF (GAUZE/BANDAGES/DRESSINGS) ×2 IMPLANT
BNDG GAUZE 4.5X4.1 6PLY STRL (MISCELLANEOUS) ×2 IMPLANT
CANISTER SUCT 1200ML W/VALVE (MISCELLANEOUS) ×2 IMPLANT
COVER WAND RF STERILE (DRAPES) ×2 IMPLANT
CUFF TOURN SGL QUICK 12 (TOURNIQUET CUFF) IMPLANT
CUFF TOURN SGL QUICK 18X4 (TOURNIQUET CUFF) IMPLANT
DRAPE FLUOR MINI C-ARM 54X84 (DRAPES) ×2 IMPLANT
DRAPE XRAY CASSETTE 23X24 (DRAPES) ×2 IMPLANT
DURAPREP 26ML APPLICATOR (WOUND CARE) ×2 IMPLANT
ELECT REM PT RETURN 9FT ADLT (ELECTROSURGICAL) ×2
ELECTRODE REM PT RTRN 9FT ADLT (ELECTROSURGICAL) ×1 IMPLANT
GAUZE PACKING IODOFORM 1/2 (PACKING) ×2 IMPLANT
GAUZE SPONGE 4X4 12PLY STRL (GAUZE/BANDAGES/DRESSINGS) ×2 IMPLANT
GAUZE XEROFORM 1X8 LF (GAUZE/BANDAGES/DRESSINGS) ×2 IMPLANT
GLOVE BIO SURGEON STRL SZ7.5 (GLOVE) ×2 IMPLANT
GLOVE INDICATOR 8.0 STRL GRN (GLOVE) ×2 IMPLANT
GOWN STRL REUS W/ TWL XL LVL3 (GOWN DISPOSABLE) ×2 IMPLANT
GOWN STRL REUS W/TWL XL LVL3 (GOWN DISPOSABLE) ×2
KIT TURNOVER KIT A (KITS) ×2 IMPLANT
LABEL OR SOLS (LABEL) ×2 IMPLANT
NEEDLE FILTER BLUNT 18X 1/2SAF (NEEDLE) ×1
NEEDLE FILTER BLUNT 18X1 1/2 (NEEDLE) ×1 IMPLANT
NEEDLE HYPO 25X1 1.5 SAFETY (NEEDLE) ×2 IMPLANT
NS IRRIG 500ML POUR BTL (IV SOLUTION) ×2 IMPLANT
PACK EXTREMITY (MISCELLANEOUS) ×2 IMPLANT
PAD ABD DERMACEA PRESS 5X9 (GAUZE/BANDAGES/DRESSINGS) ×4 IMPLANT
PULSAVAC PLUS IRRIG FAN TIP (DISPOSABLE) ×2
SHIELD FULL FACE ANTIFOG 7M (MISCELLANEOUS) ×2 IMPLANT
SOL .9 NS 3000ML IRR  AL (IV SOLUTION) ×1
SOL .9 NS 3000ML IRR UROMATIC (IV SOLUTION) ×1 IMPLANT
STOCKINETTE M/LG 89821 (MISCELLANEOUS) ×2 IMPLANT
STRAP SAFETY 5IN WIDE (MISCELLANEOUS) ×2 IMPLANT
SUT ETHILON 3-0 FS-10 30 BLK (SUTURE) ×2
SUT ETHILON 5-0 FS-2 18 BLK (SUTURE) ×2 IMPLANT
SUT VIC AB 4-0 FS2 27 (SUTURE) ×2 IMPLANT
SUTURE EHLN 3-0 FS-10 30 BLK (SUTURE) ×1 IMPLANT
SYR 10ML LL (SYRINGE) ×6 IMPLANT
TIP FAN IRRIG PULSAVAC PLUS (DISPOSABLE) ×1 IMPLANT

## 2019-11-08 NOTE — Progress Notes (Signed)
Pt still refusing surgery for now.    D/W pt that this is needed and will not heal without surgery.  Will sign off for now.  Please re-consult if pt changes decision.

## 2019-11-08 NOTE — Progress Notes (Signed)
*  PRELIMINARY RESULTS* Echocardiogram 2D Echocardiogram has been performed.  Gary Sutton 11/08/2019, 10:20 AM

## 2019-11-08 NOTE — Progress Notes (Signed)
PROGRESS NOTE    Gary Sutton  NLG:[id number] DOB: April 30, 1960 DOA: 11/05/2019 PCP: Patient, No Pcp Per    Assessment & Plan:   Active Problems:   Toe osteomyelitis (HCC)   Gary Sutton  is a 59 y.o. male with a known history of type 2 diabetes mellitus, glaucoma and degenerative disc disease, who presented to the emergency room with acute onset of right lower extremity pain involving his leg with associated right foot swelling with erythema at the big toe.  The patient stated that he had pain around his right hip flexors and has not had any trauma.  He admitted to chills but did not check his temperature.  His right big toe has been swelling over the last week.  No chest pain or dyspnea or cough or wheezing.  No dysuria, oliguria or hematuria or flank pain.   # Sepsis 2/2  # Right big toe diabetic ulcer and osteomyelitis involving the first MTP # Staph bacteremia --fever and tachycardia as well as leukocytosis on presentation. --started on cefepime and vancomycin and switched to cefazolin, and then Flagyl added, per ID. --Despite urging from podiatry, ID and I, pt continued to refuse surgical I/D of his right foot osteomyelitis.   PLAN: --cont cefazolin and Flagyl. --son to convince pt to proceed with surgical I/D --will discharge on Monday with home abx if pt continues to refuse surgery.  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  MRI lumbar spine, right hip showed the above findings. --CK not elevated. PLAN: --supportive care for now --Flexeril PRN and tramadol PRN  2.  Type 2 diabetes mellitus, poorly controlled -on home metformin. --A1c 12.6 --Diabetic educator involved.  Pt is refusing to start insulin after discharge. PLAN: --cont Levemir 10u BID while inpatient --SSI --No need to order glucometer for  home use if pt refuses insulin.  Will have pt follow up as outpatient.  3.  Glaucoma. -continue his Travatan and Timoptic ophthalmic gtts   # Anemia, microcytic, def in iron and vit B12 --anemia workup showed low vit B12 and low iron PLAN: --start supplements for both   DVT prophylaxis: Lovenox SQ Code Status: Full code  Family Communication: son updated on the phone today Status is: inpatient Dispo:   The patient is from: home Anticipated d/c is to: to be determined Anticipated d/c date is: >3 days Patient currently is not medically stable to d/c due to: bacteremia, osteomyelitis, need surgical resection, currently refusing.  Will discharge on Monday with home abx if still refusing surgery by then.   Subjective and Interval History:  No new complaints today.  Despite urging from podiatry, ID and I, pt continued to refuse surgical I/D of his right foot osteomyelitis.    Talked with son, who will try to convince pt to go for surgery.   Objective: Vitals:   11/07/19 1645 11/07/19 2337 11/08/19 0815 11/08/19 1651  BP: 126/70 (!) 148/78 (!) 141/75 140/80  Pulse: 95 99 (!) 101 99  Resp: '18 20 19 16  ' Temp: 98.8 F (37.1 C) 99.1 F (37.3 C) 100 F (37.8 C) 100.2 F (37.9 C)  TempSrc: Oral Oral Oral Oral  SpO2: 92% 93% 94% 92%  Weight:      Height:        Intake/Output Summary (Last 24 hours) at 11/08/2019 1800 Last data filed  at 11/08/2019 1140 Gross per 24 hour  Intake --  Output 1675 ml  Net -1675 ml   Filed Weights   11/05/19 1841  Weight: 86.2 kg    Examination:   Constitutional: NAD, AAOx3 HEENT: conjunctivae and lids normal, EOMI CV: No cyanosis.   RESP: normal respiratory effort, on RA Extremities: right foot wrapped, significantly more swollen than left SKIN: warm.  Dry and peeling skin over both feet and lower legs Neuro: II - XII grossly intact.     Data Reviewed: I have personally reviewed following labs and imaging studies  CBC: Recent Labs   Lab 11/05/19 1850 11/06/19 0450 11/07/19 0416 11/08/19 0422  WBC 13.3* 15.2* 14.4* 14.0*  NEUTROABS 11.5* 12.5*  --   --   HGB 10.7* 10.0* 9.2* 9.9*  HCT 33.3* 30.9* 27.4* 30.1*  MCV 74.5* 74.1* 72.5* 73.4*  PLT 621* 552* 489* 681*   Basic Metabolic Panel: Recent Labs  Lab 11/05/19 1850 11/06/19 0450 11/07/19 0416 11/08/19 0422  NA 130* 131* 130* 130*  K 3.9 3.7 3.5 3.4*  CL 93* 95* 95* 94*  CO2 '23 25 28 25  ' GLUCOSE 368* 320* 310* 180*  BUN '11 11 13 12  ' CREATININE 0.89 0.90 0.96 0.91  CALCIUM 8.2* 8.1* 7.6* 7.4*  MG  --   --  1.5* 1.9   GFR: Estimated Creatinine Clearance: 99.1 mL/min (by C-G formula based on SCr of 0.91 mg/dL). Liver Function Tests: Recent Labs  Lab 11/05/19 1850 11/06/19 0450  AST 13* 13*  ALT 13 11  ALKPHOS 105 86  BILITOT 0.9 1.1  PROT 9.1* 8.2*  ALBUMIN 2.7* 2.2*   No results for input(s): LIPASE, AMYLASE in the last 168 hours. No results for input(s): AMMONIA in the last 168 hours. Coagulation Profile: Recent Labs  Lab 11/05/19 1850 11/06/19 0450  INR 1.3* 1.4*   Cardiac Enzymes: Recent Labs  Lab 11/07/19 0416  CKTOTAL 40*   BNP (last 3 results) No results for input(s): PROBNP in the last 8760 hours. HbA1C: Recent Labs    11/06/19 0450  HGBA1C 12.6*   CBG: Recent Labs  Lab 11/07/19 2123 11/08/19 0234 11/08/19 0812 11/08/19 1144 11/08/19 1654  GLUCAP 200* 182* 153* 101* 90   Lipid Profile: No results for input(s): CHOL, HDL, LDLCALC, TRIG, CHOLHDL, LDLDIRECT in the last 72 hours. Thyroid Function Tests: No results for input(s): TSH, T4TOTAL, FREET4, T3FREE, THYROIDAB in the last 72 hours. Anemia Panel: Recent Labs    11/07/19 0416  VITAMINB12 138*  FOLATE 6.4  TIBC 113*  IRON 10*   Sepsis Labs: Recent Labs  Lab 11/05/19 1850 11/06/19 0450  LATICACIDVEN 1.2 1.1    Recent Results (from the past 240 hour(s))  Culture, blood (Routine x 2)     Status: Abnormal   Collection Time: 11/05/19  6:50 PM    Specimen: BLOOD  Result Value Ref Range Status   Specimen Description   Final    BLOOD BLOOD LEFT FOREARM Performed at [facility], [address] [place], [place] [address]    Special Requests   Final    BOTTLES DRAWN AEROBIC AND ANAEROBIC Blood Culture adequate volume Performed at [facility], [address] [place], [place] [address]    Culture  Setup Time   Final    GRAM POSITIVE COCCI IN BOTH AEROBIC AND ANAEROBIC BOTTLES Organism ID to follow CRITICAL RESULT CALLED TO, READ BACK BY AND VERIFIED WITH: KAREN HAYES 11/06/19 [place] Performed at [facility],  [place], [place] [address]    Culture STAPHYLOCOCCUS AUREUS (A)  Final   Report Status 11/08/2019 FINAL  Final   Organism ID, Bacteria STAPHYLOCOCCUS AUREUS  Final      Susceptibility   Staphylococcus aureus - MIC*    CIPROFLOXACIN <=0.5 SENSITIVE Sensitive     ERYTHROMYCIN >=8 RESISTANT Resistant     GENTAMICIN <=0.5 SENSITIVE Sensitive     OXACILLIN <=0.25 SENSITIVE Sensitive     TETRACYCLINE <=1 SENSITIVE Sensitive     VANCOMYCIN <=0.5 SENSITIVE Sensitive     TRIMETH/SULFA <=10 SENSITIVE Sensitive     CLINDAMYCIN <=0.25 SENSITIVE Sensitive     RIFAMPIN <=0.5 SENSITIVE Sensitive     Inducible Clindamycin NEGATIVE Sensitive     * STAPHYLOCOCCUS AUREUS  Blood Culture ID Panel (Reflexed)     Status: Abnormal   Collection Time: 11/05/19  6:50 PM  Result Value Ref Range Status   Enterococcus faecalis NOT DETECTED NOT DETECTED Final   Enterococcus Faecium NOT DETECTED NOT DETECTED Final   Listeria monocytogenes NOT DETECTED NOT DETECTED Final   Staphylococcus species DETECTED (A) NOT DETECTED Final    Comment: CRITICAL RESULT CALLED TO, READ BACK BY AND VERIFIED WITH: KAREN HAYES 11/06/19 0752 KLW    Staphylococcus aureus (BCID) DETECTED (A) NOT DETECTED Final    Comment: CRITICAL RESULT CALLED TO, READ BACK BY AND VERIFIED WITH: KAREN HAYES 11/06/19 0752 KLW     Staphylococcus epidermidis NOT DETECTED NOT DETECTED Final   Staphylococcus lugdunensis NOT DETECTED NOT DETECTED Final   Streptococcus species NOT DETECTED NOT DETECTED Final   Streptococcus agalactiae NOT DETECTED NOT DETECTED Final   Streptococcus pneumoniae NOT DETECTED NOT DETECTED Final   Streptococcus pyogenes NOT DETECTED NOT DETECTED Final   A.calcoaceticus-baumannii NOT DETECTED NOT DETECTED Final   Bacteroides fragilis NOT DETECTED NOT DETECTED Final   Enterobacterales NOT DETECTED NOT DETECTED Final   Enterobacter cloacae complex NOT DETECTED NOT DETECTED Final   Escherichia coli NOT DETECTED NOT DETECTED Final   Klebsiella aerogenes NOT DETECTED NOT DETECTED Final   Klebsiella oxytoca NOT DETECTED NOT DETECTED Final   Klebsiella pneumoniae NOT DETECTED NOT DETECTED Final   Proteus species NOT DETECTED NOT DETECTED Final   Salmonella species NOT DETECTED NOT DETECTED Final   Serratia marcescens NOT DETECTED NOT DETECTED Final   Haemophilus influenzae NOT DETECTED NOT DETECTED Final   Neisseria meningitidis NOT DETECTED NOT DETECTED Final   Pseudomonas aeruginosa NOT DETECTED NOT DETECTED Final   Stenotrophomonas maltophilia NOT DETECTED NOT DETECTED Final   Candida albicans NOT DETECTED NOT DETECTED Final   Candida auris NOT DETECTED NOT DETECTED Final   Candida glabrata NOT DETECTED NOT DETECTED Final   Candida krusei NOT DETECTED NOT DETECTED Final   Candida parapsilosis NOT DETECTED NOT DETECTED Final   Candida tropicalis NOT DETECTED NOT DETECTED Final   Cryptococcus neoformans/gattii NOT DETECTED NOT DETECTED Final   Meth resistant mecA/C and MREJ NOT DETECTED NOT DETECTED Final    Comment: Performed at [facility], [address] [place], [place] [address]  Culture, blood (Routine x 2)     Status: Abnormal   Collection Time: 11/05/19  8:47 PM   Specimen: BLOOD  Result Value Ref Range Status   Specimen Description   Final    BLOOD LEFT FA Performed at  [facility], [address] [place], [place] [address]    Special Requests   Final    BOTTLES DRAWN AEROBIC AND ANAEROBIC Blood Culture adequate volume Performed  at [facility], [address] [place] [address], [place], [place] [address]    Culture  Setup Time   Final    GRAM POSITIVE COCCI IN BOTH AEROBIC AND ANAEROBIC BOTTLES CRITICAL VALUE NOTED.  VALUE IS CONSISTENT WITH PREVIOUSLY REPORTED AND CALLED VALUE. Performed at [facility], [address] [place], [place] [address]    Culture (A)  Final    STAPHYLOCOCCUS AUREUS SUSCEPTIBILITIES PERFORMED ON PREVIOUS CULTURE WITHIN THE LAST 5 DAYS. Performed at [facility], [place] [address] [place], [place] [address]    Report Status 11/08/2019 FINAL  Final  Respiratory Panel by RT PCR (Flu A&B, Covid) - Nasopharyngeal Swab     Status: None   Collection Time: 11/05/19  9:27 PM   Specimen: Nasopharyngeal Swab  Result Value Ref Range Status   SARS Coronavirus 2 by RT PCR NEGATIVE NEGATIVE Final    Comment: (NOTE) SARS-CoV-2 target nucleic acids are NOT DETECTED.  The SARS-CoV-2 RNA is generally detectable in upper respiratoy specimens during the acute phase of infection. The lowest concentration of SARS-CoV-2 viral copies this assay can detect is 131 copies/mL. A negative result does not preclude SARS-Cov-2 infection and should not be used as the sole basis for treatment or other patient management decisions. A negative result may occur with  improper specimen collection/handling, submission of specimen other than nasopharyngeal swab, presence of viral mutation(s) within the areas targeted by this assay, and inadequate number of viral copies (<131 copies/mL). A negative result must be combined with clinical observations, patient history, and epidemiological information. The expected result is Negative.  Fact Sheet for Patients:  PinkCheek.be  Fact Sheet for Healthcare Providers:   GravelBags.it  This test is no t yet approved or cleared by the [place] FDA and  has been authorized for detection and/or diagnosis of SARS-CoV-2 by FDA under an Emergency Use Authorization (EUA). This EUA will remain  in effect (meaning this test can be used) for the duration of the COVID-19 declaration under Section 564(b)(1) of the Act, 21 U.S.C. section 360bbb-3(b)(1), unless the authorization is terminated or revoked sooner.     Influenza A by PCR NEGATIVE NEGATIVE Final   Influenza B by PCR NEGATIVE NEGATIVE Final    Comment: (NOTE) The Xpert Xpress SARS-CoV-2/FLU/RSV assay is intended as an aid in  the diagnosis of influenza from Nasopharyngeal swab specimens and  should not be used as a sole basis for treatment. Nasal washings and  aspirates are unacceptable for Xpert Xpress SARS-CoV-2/FLU/RSV  testing.  Fact Sheet for Patients: PinkCheek.be  Fact Sheet for Healthcare Providers: GravelBags.it  This test is not yet approved or cleared by the [place] FDA and  has been authorized for detection and/or diagnosis of SARS-CoV-2 by  FDA under an Emergency Use Authorization (EUA). This EUA will remain  in effect (meaning this test can be used) for the duration of the  Covid-19 declaration under Section 564(b)(1) of the Act, 21  U.S.C. section 360bbb-3(b)(1), unless the authorization is  terminated or revoked. Performed at [facility], [place], [place] [address]   C Difficile Quick Screen w PCR reflex     Status: None   Collection Time: 11/06/19  9:48 AM   Specimen: STOOL  Result Value Ref Range Status   C Diff antigen NEGATIVE NEGATIVE Final   C Diff toxin NEGATIVE NEGATIVE Final   C Diff interpretation No C. difficile detected.  Final    Comment: Performed at [facility], [address] [place], [place] [address]  Aerobic/Anaerobic  Culture (surgical/deep wound)     Status: None (Preliminary result)   Collection Time: 11/06/19  5:55 PM   Specimen: Toe  Result Value Ref Range Status   Specimen Description   Final    TOE LEFT GREAT TOE Performed at [facility], [address] [place], [place] [address]    Special Requests   Final    NONE Performed at [facility], [address] [place], [place] [address]    Gram Stain   Final    RARE WBC PRESENT,BOTH PMN AND MONONUCLEAR RARE GRAM POSITIVE COCCI Performed at [facility], [place] [address] [place], [place] [address]    Culture   Final    FEW STAPHYLOCOCCUS AUREUS RARE STREPTOCOCCUS MITIS/ORALIS RARE KLEBSIELLA PNEUMONIAE NO ANAEROBES ISOLATED; CULTURE IN PROGRESS FOR 5 DAYS    Report Status PENDING  Incomplete  Surgical pcr screen     Status: Abnormal   Collection Time: 11/07/19  3:40 PM   Specimen: Nasal Mucosa; Nasal Swab  Result Value Ref Range Status   MRSA, PCR NEGATIVE NEGATIVE Final   Staphylococcus aureus POSITIVE (A) NEGATIVE Final    Comment: (NOTE) The Xpert SA Assay (FDA approved for NASAL specimens in patients 74 years of age and older), is one component of a comprehensive surveillance program. It is not intended to diagnose infection nor to guide or monitor treatment. Performed at [facility], [address] [place], [place] [address]   Culture, blood (Routine X 2) w Reflex to ID Panel     Status: None (Preliminary result)   Collection Time: 11/08/19 12:11 AM   Specimen: BLOOD  Result Value Ref Range Status   Specimen Description BLOOD RIGHT ANTECUBITAL  Final   Special Requests   Final    BOTTLES DRAWN AEROBIC AND ANAEROBIC Blood Culture results may not be optimal due to an excessive volume of blood received in culture bottles   Culture   Final    NO GROWTH < 12 HOURS Performed at [facility], [address] [place], [place] [address]    Report Status PENDING  Incomplete  Culture,  blood (Routine X 2) w Reflex to ID Panel     Status: None (Preliminary result)   Collection Time: 11/08/19 12:17 AM   Specimen: BLOOD  Result Value Ref Range Status   Specimen Description BLOOD LEFT ANTECUBITAL  Final   Special Requests   Final    BOTTLES DRAWN AEROBIC AND ANAEROBIC Blood Culture results may not be optimal due to an excessive volume of blood received in culture bottles   Culture   Final    NO GROWTH < 12 HOURS Performed at [facility], [address], [place], [place] [address]    Report Status PENDING  Incomplete      Radiology Studies: MR Lumbar Spine W Wo Contrast  Result Date: 11/06/2019 CLINICAL DATA:  Back pain and bacteremia EXAM: MRI LUMBAR SPINE WITHOUT AND WITH CONTRAST TECHNIQUE: Multiplanar and multiecho pulse sequences of the lumbar spine were obtained without and with intravenous contrast. CONTRAST:  34m GADAVIST GADOBUTROL 1 MMOL/ML IV SOLN COMPARISON:  None. FINDINGS: Segmentation:  Standard Alignment:  Physiologic. Vertebrae: Nonspecific heterogeneous bone marrow signal. No abnormal contrast enhancement. Conus medullaris and cauda equina: Conus extends to the L1 level. Conus and cauda equina appear normal. Paraspinal and other soft tissues: Negative Disc levels: T12-L1: Normal. L1-L2: Ankylosis. Normal disc. No spinal canal stenosis. No neural foraminal  stenosis. L2-L3: Ankylosis. Normal disc. No spinal canal stenosis. No neural foraminal stenosis. L3-L4: Ankylosis. Normal disc. No spinal canal stenosis. No neural foraminal stenosis. L4-L5: Ankylosis. Small central disc protrusion. Left lateral recess narrowing without central spinal canal stenosis. No neural foraminal stenosis. L5-S1: Ankylosis. No disc herniation. No spinal canal stenosis. No neural foraminal stenosis. Visualized sacrum: Ankylosis of both sacroiliac joints. IMPRESSION: 1. No epidural abscess or discitis-osteomyelitis. 2. Ankylosis of both sacroiliac joints and all lumbar vertebral  levels, likely ankylosing spondylitis. 3. Mild L4-L5 left lateral recess stenosis secondary to small central disc protrusion, a potential source of L5 radiculopathy. 4. Diffusely heterogeneous bone marrow signal is nonspecific and may be seen in chronic anemia, obesity, long-term smoking or in marrow replacement processes such as multiple myeloma Electronically Signed   By: Ulyses Jarred M.D.   On: 11/06/2019 22:10   MR HIP RIGHT W WO CONTRAST  Result Date: 11/07/2019 CLINICAL DATA:  Right hip pain.  Bacteremia. EXAM: MRI OF THE RIGHT HIP WITHOUT AND WITH CONTRAST TECHNIQUE: Multiplanar, multisequence MR imaging was performed both before and after administration of intravenous contrast. CONTRAST:  60m GADAVIST GADOBUTROL 1 MMOL/ML IV SOLN COMPARISON:  Right femur x-rays from same day. FINDINGS: Bones: There is no evidence of acute fracture, dislocation or avascular necrosis. Multiple hemangiomas involving the sacrum and bilateral iliac bones. Ankylosis of the bilateral sacroiliac joints. Normal pubic symphysis. Articular cartilage and labrum Articular cartilage: Scattered partial and full-thickness cartilage loss in both hip joints with subchondral marrow edema and cystic change in the acetabula and bulky marginal acetabular osteophytes. Labrum: Degenerated right anterior superior labrum. Joint or bursal effusion Joint effusion: Trace right hip joint effusion with 7 mm intra-articular body (series 32, image 18). No synovial enhancement. Bursae: Small amount of fluid in the right greater than left greater trochanteric bursae. Muscles and tendons Muscles and tendons: Prominent edema in the right gluteus minimus muscle with enhancement. Lesser edema in the proximal vastus muscles without significant enhancement. No significant muscle atrophy. The visualized gluteus, hamstring and iliopsoas tendons are intact. Other findings Miscellaneous: Subcutaneous edema overlying the right hip, without enhancement. Multiple  enlarged right inguinal and external iliac lymph nodes, likely reactive. The visualized internal pelvic contents appear unremarkable. Trace free fluid in the pelvis is nonspecific. IMPRESSION: 1. No evidence of osteomyelitis. No findings suggestive of septic arthritis. 2. Prominent edema in the right gluteus minimus muscle, concerning for myositis in the absence of trauma. 3. Moderate bilateral hip osteoarthritis. Trace right hip joint effusion with 7 mm intra-articular body. 4. Mild right greater than left greater trochanteric bursitis. Electronically Signed   By: WTitus DubinM.D.   On: 11/07/2019 08:32   MR FOOT RIGHT WO CONTRAST  Result Date: 11/07/2019 CLINICAL DATA:  Right great toe and foot swelling with redness. EXAM: MRI OF THE RIGHT FOREFOOT WITHOUT CONTRAST TECHNIQUE: Multiplanar, multisequence MR imaging of the right forefoot was performed. No intravenous contrast was administered. COMPARISON:  Right foot x-rays from yesterday. FINDINGS: Bones/Joint/Cartilage Extensive marrow edema with corresponding decreased T1 marrow signal involving the entire first proximal phalanx and majority of the first metatarsal, sparing the base, as well as the medial and lateral hallux sesamoids. Bony destruction of the first metatarsal head and base of the first proximal phalanx. Small foci of low T1 and T2 signal within first proximal phalanx and first metatarsal head, concerning for gas. Large first MTP joint effusion. No fracture or dislocation. Ligaments The medial collateral ligament at the first MTP joint is not well  identified and likely destroyed. Remaining collateral ligaments are intact. Muscles and Tendons Flexor and extensor tendons are intact. No tenosynovitis. Increased T2 signal within in diffuse fatty atrophy of the intrinsic muscles of the forefoot, nonspecific, but likely related to diabetic muscle changes. Soft tissue Soft tissue ulcer along the medial first MTP joint with sinus tract extending to  the first metatarsal head. No fluid collection or hematoma. No soft tissue mass. IMPRESSION: 1. Soft tissue ulcer along the medial first MTP joint with sinus tract extending to the first metatarsal head. 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 2. No abscess. Electronically Signed   By: Titus Dubin M.D.   On: 11/07/2019 08:40   ECHOCARDIOGRAM COMPLETE  Result Date: 11/08/2019    ECHOCARDIOGRAM REPORT   Patient Name:   Gary Sutton Agudelo Date of Exam: 11/08/2019 Medical Rec #:  [id number]   Height:       74.0 in Accession #:    [id number]  Weight:       190.0 lb Date of Birth:  October 15, 1960   BSA:          2.127 m Patient Age:    38 years    BP:           141/75 mmHg Patient Gender: M           HR:           101 bpm. Exam Location:  [facility] Procedure: 2D Echo, Color Doppler and Cardiac Doppler Indications:     R78.81 Bacteremia  History:         Patient has no prior history of Echocardiogram examinations.                  Risk Factors:Diabetes.  Sonographer:     Charmayne Sheer RDCS (AE) Referring Phys:  [id number] Tsosie Billing Diagnosing Phys: Bartholome Bill MD  Sonographer Comments: No subcostal window. IMPRESSIONS  1. Left ventricular ejection fraction, by estimation, is 50 to 55%. The left ventricle has low normal function. The left ventricle has no regional wall motion abnormalities. Left ventricular diastolic parameters are consistent with Grade I diastolic dysfunction (impaired relaxation).  2. Right ventricular systolic function is normal. The right ventricular size is normal.  3. The mitral valve is grossly normal. Trivial mitral valve regurgitation.  4. The aortic valve is grossly normal. Aortic valve regurgitation is not visualized. FINDINGS  Left Ventricle: Left ventricular ejection fraction, by estimation, is 50 to 55%. The left ventricle has low normal function. The left ventricle has no regional  wall motion abnormalities. The left ventricular internal cavity size was normal in size. There is borderline left ventricular hypertrophy. Left ventricular diastolic parameters are consistent with Grade I diastolic dysfunction (impaired relaxation). Right Ventricle: The right ventricular size is normal. No increase in right ventricular wall thickness. Right ventricular systolic function is normal. Left Atrium: Left atrial size was normal in size. Right Atrium: Right atrial size was normal in size. Pericardium: There is no evidence of pericardial effusion. Mitral Valve: The mitral valve is grossly normal. Trivial mitral valve regurgitation. MV peak gradient, 3.8 mmHg. The mean mitral valve gradient is 2.0 mmHg. There is no evidence of mitral valve vegetation. Tricuspid Valve: The tricuspid valve is grossly normal. Tricuspid valve regurgitation is trivial. There is no evidence of tricuspid valve vegetation. Aortic Valve: The aortic valve is grossly normal. Aortic valve regurgitation is not visualized. Aortic valve mean  gradient measures 2.0 mmHg. Aortic valve peak gradient measures 3.6 mmHg. Aortic valve area, by VTI measures 3.81 cm. Pulmonic Valve: The pulmonic valve was not well visualized. Pulmonic valve regurgitation is trivial. Aorta: The aortic root is normal in size and structure. IAS/Shunts: The interatrial septum was not assessed.  LEFT VENTRICLE PLAX 2D LVIDd:         4.72 cm  Diastology LVIDs:         3.47 cm  LV e' medial:    5.11 cm/s LV PW:         1.33 cm  LV E/e' medial:  12.7 LV IVS:        0.92 cm  LV e' lateral:   8.59 cm/s LVOT diam:     2.50 cm  LV E/e' lateral: 7.6 LV SV:         63 LV SV Index:   30 LVOT Area:     4.91 cm  RIGHT VENTRICLE RV Basal diam:  2.98 cm LEFT ATRIUM             Index       RIGHT ATRIUM           Index LA diam:        2.90 cm 1.36 cm/m  RA Area:     14.90 cm LA Vol (A2C):   44.9 ml 21.11 ml/m RA Volume:   36.30 ml  17.07 ml/m LA Vol (A4C):   32.4 ml 15.24 ml/m LA  Biplane Vol: 38.9 ml 18.29 ml/m  AORTIC VALVE                   PULMONIC VALVE AV Area (Vmax):    3.99 cm    PV Vmax:       0.75 m/s AV Area (Vmean):   4.05 cm    PV Vmean:      55.400 cm/s AV Area (VTI):     3.81 cm    PV VTI:        0.138 m AV Vmax:           95.50 cm/s  PV Peak grad:  2.2 mmHg AV Vmean:          56.400 cm/s PV Mean grad:  1.0 mmHg AV VTI:            0.165 m AV Peak Grad:      3.6 mmHg AV Mean Grad:      2.0 mmHg LVOT Vmax:         77.60 cm/s LVOT Vmean:        46.500 cm/s LVOT VTI:          0.128 m LVOT/AV VTI ratio: 0.78  AORTA Ao Root diam: 3.90 cm MITRAL VALVE MV Area (PHT): 13.55 cm   SHUNTS MV Peak grad:  3.8 mmHg    Systemic VTI:  0.13 m MV Mean grad:  2.0 mmHg    Systemic Diam: 2.50 cm MV Vmax:       0.97 m/s MV Vmean:      62.7 cm/s MV Decel Time: 56 msec MV E velocity: 65.00 cm/s MV A velocity: 76.60 cm/s MV E/A ratio:  0.85 Bartholome Bill MD Electronically signed by Bartholome Bill MD Signature Date/Time: 11/08/2019/12:45:33 PM    Final      Scheduled Meds: . brimonidine  1 drop Both Eyes BID  . chlorhexidine  60 mL Topical Once  . cholecalciferol  1,000 Units Oral Daily  . enoxaparin (LOVENOX) injection  40 mg Subcutaneous Q24H  . insulin aspart  0-15 Units Subcutaneous TID PC,HS,0200  . insulin detemir  10 Units Subcutaneous BID  . povidone-iodine  2 application Topical Once  . timolol  1 drop Both Eyes BID   Continuous Infusions: . sodium chloride 10 mL/hr at 11/08/19 0205  .  ceFAZolin (ANCEF) IV 2 g (11/08/19 1322)  . metronidazole 500 mg (11/08/19 1756)     LOS: 3 days     Enzo Bi, MD [facility] If 7PM-7AM, please contact night-coverage 11/08/2019, 6:00 PM

## 2019-11-08 NOTE — Progress Notes (Signed)
Inpatient Diabetes Program Recommendations  AACE/ADA: New Consensus Statement on Inpatient Glycemic Control (2015)  Target Ranges:  Prepandial:   less than 140 mg/dL      Peak postprandial:   less than 180 mg/dL (1-2 hours)      Critically ill patients:  140 - 180 mg/dL   Lab Results  Component Value Date   GLUCAP 101 (H) 11/08/2019   HGBA1C 12.6 (H) 11/06/2019    Review of Glycemic Control Results for Gary Sutton, Gary Sutton (MRN [id number]) as of 11/08/2019 13:14  Ref. Range 11/07/2019 16:45 11/07/2019 21:23 11/08/2019 02:34 11/08/2019 08:12 11/08/2019 11:44  Glucose-Capillary Latest Ref Range: 70 - 99 mg/dL 053 (H) 976 (H) 734 (H) 153 (H) 101 (H)   Diabetes history: DM 2 Outpatient Diabetes medications: Metformin 500 mg bid Current orders for Inpatient glycemic control:  Novolog moderate tid with meals Levemir 10 units bid Inpatient Diabetes Program Recommendations:    Note A1C=12.6%. Patient needs insulin at d/c.    Thanks,  Beryl Meager, RN, BC-ADM Inpatient Diabetes Coordinator Pager [phone]   Addendum:  Spoke with patient regarding A1C of 12.6%.  Patient states that he did not know he had diabetes until now?  However he did mention that this blood sugars were elevated in the past and he was started on Metformin? He states that the MD started Metformin, however he never got it filled and never took it.  We discussed diet and he states he drinks mostly water.  He states that he plans to cut back on high carbohydrate diet and sugar.  We discussed normal blood sugar values of 80-130 mg/dL.  He does not have a meter at home for checking blood sugars and states that he struggles to see due to cataracts.  He lives with his son who he states can help him although he would not consent to me calling his son.  He states that he does not want to be on insulin at home and that his blood sugars are now better.  I tried to explain that this was b/c he is on insulin in the hospital, however he was  adamant about not taking insulin at home.  May be reasonable to take oral meds. for diabetes with very close follow-up since patient was not taking any medications for diabetes previously.  He is concerned about not being able to make it to the bathroom if he has diarrhea with Metformin?  We discussed taking medication with food. Tried to explain that controlling blood sugar will help his foot heal from infection as well. However he kept saying "I did not come in b/c of my foot".  Needs continued reinforcement of information pertaining to DM and likely will also need possibly home health if d/c'd home.    Thanks  Beryl Meager, RN, BC-ADM Inpatient Diabetes Coordinator Pager [phone] (8a-5p)

## 2019-11-08 NOTE — Progress Notes (Signed)
ID Tm 100, wbc 14.   Patient Vitals for the past 24 hrs:  BP Temp Temp src Pulse Resp SpO2  11/08/19 0815 (!) 141/75 100 F (37.8 C) Oral (!) 101 19 94 %  11/07/19 2337 (!) 148/78 99.1 F (37.3 C) Oral 99 20 93 %  11/07/19 1645 126/70 98.8 F (37.1 C) Oral 95 18 92 %    O/e awake and alert Chest b/l air entry [id number] abd soft Rt leg /groin buttock area painful  CBC Latest Ref Rng & Units 11/08/2019 11/07/2019 11/06/2019  WBC 4.0 - 10.5 K/uL 14.0(H) 14.4(H) 15.2(H)  Hemoglobin 13.0 - 17.0 g/dL 9.9(L) 9.2(L) 10.0(L)  Hematocrit 39 - 52 % 30.1(L) 27.4(L) 30.9(L)  Platelets 150 - 400 K/uL 556(H) 489(H) 552(H)    CMP Latest Ref Rng & Units 11/08/2019 11/07/2019 11/06/2019  Glucose 70 - 99 mg/dL 180(H) 310(H) 320(H)  BUN 8 - 23 mg/dL _0 Creatinine 0.61 - 1.24 mg/dL 0.91 0.96 0.90  Sodium 135 - 145 mmol/L 130(L) 130(L) 131(L)  Potassium 3.5 - 5.1 mmol/L 3.4(L) 3.5 3.7  Chloride 98 - 111 mmol/L 94(L) 95(L) 95(L)  CO2 22 - 32 mmol/L _1 Calcium 8.9 - 10.3 mg/dL 7.4(L) 7.6(L) 8.1(L)  Total Protein 6.5 - 8.1 g/dL - - 8.2(H)  Total Bilirubin 0.3 - 1.2 mg/dL - - 1.1  Alkaline Phos 38 - 126 U/L - - 86  AST 15 - 41 U/L - - 13(L)  ALT 0 - 44 U/L - - 11    Imaging MRI foot Soft tissue ulcer along the medial first MTP joint with sinus tract extending to the first metatarsal head. 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  MRI lumbar spine/hip No epidural abscess or discitis-osteomyelitis. 2. 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 3. Mild L4-L5 left lateral recess stenosis secondary to small central disc protrusion, a potential source of L5 radiculopathy. 4. Diffusely heterogeneous bone marrow signal is nonspecific and may be seen in chronic anemia, obesity, long-term smoking or in marrow replacement  processes such as multiple myeloma   MRI hip No evidence of osteomyelitis. No findings suggestive of septic arthritis. 2. Prominent edema in the right gluteus minimus muscle, concerning for myositis in the absence of trauma. 3. Moderate bilateral hip osteoarthritis. Trace right hip joint effusion with 7 mm intra-articular body. 4. Mild right greater than left greater trochanteric bursitis.  TTE IMPRESSIONS    1. Left ventricular ejection fraction, by estimation, is 50 to 55%. The  left ventricle has low normal function. The left ventricle has no regional  wall motion abnormalities. Left ventricular diastolic parameters are  consistent with Grade I diastolic  dysfunction (impaired relaxation).  2. Right ventricular systolic function is normal. The right ventricular  size is normal.  3. The mitral valve is grossly normal. Trivial mitral valve  regurgitation.  4. The aortic valve is grossly normal. Aortic valve regurgitation is not  visualized.   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 Trauma ? myositis

## 2019-11-09 DIAGNOSIS — M869 Osteomyelitis, unspecified: Secondary | ICD-10-CM | POA: Diagnosis not present

## 2019-11-09 LAB — BASIC METABOLIC PANEL
Anion gap: 10 (ref 5–15)
BUN: 10 mg/dL (ref 8–23)
CO2: 26 mmol/L (ref 22–32)
Calcium: 7.6 mg/dL — ABNORMAL LOW (ref 8.9–10.3)
Chloride: 95 mmol/L — ABNORMAL LOW (ref 98–111)
Creatinine, Ser: 0.89 mg/dL (ref 0.61–1.24)
GFR, Estimated: >60 mL/min (ref >60)
Glucose, Bld: 95 mg/dL (ref 70–99)
Potassium: 3.8 mmol/L (ref 3.5–5.1)
Sodium: 131 mmol/L — ABNORMAL LOW (ref 135–145)

## 2019-11-09 LAB — GLUCOSE, CAPILLARY
Glucose-Capillary: 102 mg/dL — ABNORMAL HIGH (ref 70–99)
Glucose-Capillary: 103 mg/dL — ABNORMAL HIGH (ref 70–99)
Glucose-Capillary: 108 mg/dL — ABNORMAL HIGH (ref 70–99)
Glucose-Capillary: 115 mg/dL — ABNORMAL HIGH (ref 70–99)
Glucose-Capillary: 122 mg/dL — ABNORMAL HIGH (ref 70–99)
Glucose-Capillary: 124 mg/dL — ABNORMAL HIGH (ref 70–99)

## 2019-11-09 LAB — CBC
HCT: 29.5 % — ABNORMAL LOW (ref 39.0–52.0)
Hemoglobin: 9.3 g/dL — ABNORMAL LOW (ref 13.0–17.0)
MCH: 23.3 pg — ABNORMAL LOW (ref 26.0–34.0)
MCHC: 31.5 g/dL (ref 30.0–36.0)
MCV: 73.8 fL — ABNORMAL LOW (ref 80.0–100.0)
Platelets: 548 10*3/uL — ABNORMAL HIGH (ref 150–400)
RBC: 4 MIL/uL — ABNORMAL LOW (ref 4.22–5.81)
RDW: 13.7 % (ref 11.5–15.5)
WBC: 12.3 10*3/uL — ABNORMAL HIGH (ref 4.0–10.5)
nRBC: 0 % (ref 0.0–0.2)

## 2019-11-09 LAB — MAGNESIUM: Magnesium: 2 mg/dL (ref 1.7–2.4)

## 2019-11-09 MED ORDER — POLYSACCHARIDE IRON COMPLEX 150 MG PO CAPS
150.0000 mg | ORAL_CAPSULE | Freq: Every day | ORAL | Status: DC
  Administered 2019-11-09 – 2019-11-20 (×11): 150 mg via ORAL
  Filled 2019-11-09 (×12): qty 1

## 2019-11-09 MED ORDER — CYANOCOBALAMIN 1000 MCG/ML IJ SOLN
1000.0000 ug | Freq: Once | INTRAMUSCULAR | Status: AC
  Administered 2019-11-09: 1000 ug via INTRAMUSCULAR
  Filled 2019-11-09: qty 1

## 2019-11-09 NOTE — Progress Notes (Signed)
   11/09/19 0823  Assess: MEWS Score  Temp 99.6 F (37.6 C)  BP (!) 165/91  Pulse Rate (!) 113  Resp 17  SpO2 94 %  O2 Device Room Air  Assess: MEWS Score  MEWS Temp 0  MEWS Systolic 0  MEWS Pulse 2  MEWS RR 0  MEWS LOC 0  MEWS Score 2  MEWS Score Color Yellow  Assess: if the MEWS score is Yellow or Red  Were vital signs taken at a resting state? Yes  Focused Assessment No change from prior assessment  Early Detection of Sepsis Score *See Row Information* Low  MEWS guidelines implemented *See Row Information* Yes  Treat  MEWS Interventions Administered prn meds/treatments  Pain Scale 0-10  Pain Score 6  Pain Type Acute pain  Pain Location Groin  Pain Orientation Right  Pain Descriptors / Indicators Discomfort  Pain Intervention(s) Medication (See eMAR)  Breathing 0  Negative Vocalization 0  Body Language 0  Take Vital Signs  Increase Vital Sign Frequency  Yellow: Q 2hr X 2 then Q 4hr X 2, if remains yellow, continue Q 4hrs  Escalate  MEWS: Escalate Yellow: discuss with charge nurse/RN and consider discussing with provider and RRT  Notify: Charge Nurse/RN  Name of Charge Nurse/RN Notified Lucy Antigua  Date Charge Nurse/RN Notified 11/09/19  Time Charge Nurse/RN Notified [id number]  Notify: Provider  Provider Name/Title Darlin Priestly, MD  Date Provider Notified 11/09/19  Time Provider Notified [phone]  Notification Reason Change in status  Response No new orders  Date of Provider Response 11/09/19  Time of Provider Response [phone]

## 2019-11-09 NOTE — Progress Notes (Signed)
   11/09/19 0823  Vitals  Temp 99.6 F (37.6 C)  BP (!) 165/91  Pulse Rate (!) 113  Resp 17  MEWS COLOR  MEWS Score Color Yellow  Oxygen Therapy  SpO2 94 %  O2 Device Room Air  Pain Assessment  Pain Scale 0-10  Pain Score 6  Pain Type Acute pain  Pain Location Groin  Pain Orientation Right  Pain Descriptors / Indicators Discomfort  Pain Intervention(s) Medication (See eMAR)  PAINAD (Pain Assessment in Advanced Dementia)  Breathing 0  Negative Vocalization 0  Body Language 0  MEWS Score  MEWS Temp 0  MEWS Systolic 0  MEWS Pulse 2  MEWS RR 0  MEWS LOC 0  MEWS Score 2  Provider Notification  Provider Name/Title Darlin Priestly, MD  Date Provider Notified 11/09/19  Time Provider Notified [phone]  Notification Reason Change in status  Response No new orders  Date of Provider Response 11/09/19  Time of Provider Response [phone]

## 2019-11-09 NOTE — Progress Notes (Signed)
PROGRESS NOTE    Gary Sutton  MOL:[id number] DOB: 1960/03/14 DOA: 11/05/2019 PCP: Patient, No Pcp Per    Assessment & Plan:   Active Problems:   Toe osteomyelitis (HCC)   Gary Sutton  is a 59 y.o. male with a known history of type 2 diabetes mellitus, glaucoma and degenerative disc disease, who presented to the emergency room with acute onset of right lower extremity pain involving his leg with associated right foot swelling with erythema at the big toe.  The patient stated that he had pain around his right hip flexors and has not had any trauma.  He admitted to chills but did not check his temperature.  His right big toe has been swelling over the last week.  No chest pain or dyspnea or cough or wheezing.  No dysuria, oliguria or hematuria or flank pain.   # Sepsis 2/2  # Right big toe diabetic ulcer and osteomyelitis involving the first MTP # Staph bacteremia --fever and tachycardia as well as leukocytosis on presentation. --started on cefepime and vancomycin and switched to cefazolin, and then Flagyl added, per ID. --Despite repeated urging from podiatry, ID and I, pt continued to refuse surgical I/D of his right foot osteomyelitis.   PLAN: --cont cefazolin and Flagyl. --son to convince pt to proceed with surgical I/D --f/u repeat blood cx until neg x3 days. --will discharge on Monday with home abx if pt continues to refuse surgery.  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  MRI lumbar spine, right hip showed the above findings. --CK not elevated. PLAN: --supportive care for now --Flexeril PRN and tramadol PRN  2.  Type 2 diabetes mellitus, poorly controlled -on home metformin. --A1c 12.6 --Diabetic educator involved.  Pt is refusing to start insulin after discharge. PLAN: --cont Levemir 10u BID while  inpatient --SSI --continue current insulin regimen --No need to order glucometer for home use if pt refuses insulin.  Will have pt follow up as outpatient.  3.  Glaucoma. -continue his Travatan and Timoptic ophthalmic gtts   # Anemia, microcytic, def in iron and vit B12 --anemia workup showed low vit B12 and low iron PLAN: --Vit B12 injection x1 today --start oral iron suppl.  # Suspect multiple myeloma --increased in urine protein, MRI spine showing possible marrow replacement processes. PLAN: --send SPEP   DVT prophylaxis: Lovenox SQ Code Status: Full code  Family Communication:  Status is: inpatient Dispo:   The patient is from: home Anticipated d/c is to: to be determined Anticipated d/c date is: 2 days Patient currently is not medically stable to d/c due to: bacteremia, osteomyelitis, need surgical resection, currently refusing.  Will discharge on Monday with home abx if still refusing surgery by then, after repeat blood cx neg x3 days.   Subjective and Interval History:  Similar pain in his thigh and hip.  No pain in his right foot.  Still has not agreed to surgery.   Objective: Vitals:   11/09/19 0823 11/09/19 1043 11/09/19 1244 11/09/19 1656  BP: (!) 165/91 132/69 121/81 133/75  Pulse: (!) 113 (!) 113 (!) 106 94  Resp: _0 Temp: 99.6 F (37.6 C) (!) 100.9 F (38.3 C) 99.2 F (37.3 C) 99.7 F (37.6 C)  TempSrc:  Oral Oral Oral  SpO2: 94% 94% 99% 98%  Weight:  Height:        Intake/Output Summary (Last 24 hours) at 11/09/2019 1726 Last data filed at 11/09/2019 1600 Gross per 24 hour  Intake 1252.34 ml  Output 2020 ml  Net -767.66 ml   Filed Weights   11/05/19 1841  Weight: 86.2 kg    Examination:   Constitutional: NAD, AAOx3 HEENT: conjunctivae and lids normal, EOMI CV: No cyanosis.   RESP: normal respiratory effort, on RA Extremities: right foot in wrap, less swollen SKIN: warm.  Severely dry and peeling skin in both lower  legs Neuro: II - XII grossly intact.     Data Reviewed: I have personally reviewed following labs and imaging studies  CBC: Recent Labs  Lab 11/05/19 1850 11/06/19 0450 11/07/19 0416 11/08/19 0422 11/09/19 0324  WBC 13.3* 15.2* 14.4* 14.0* 12.3*  NEUTROABS 11.5* 12.5*  --   --   --   HGB 10.7* 10.0* 9.2* 9.9* 9.3*  HCT 33.3* 30.9* 27.4* 30.1* 29.5*  MCV 74.5* 74.1* 72.5* 73.4* 73.8*  PLT 621* 552* 489* 556* 681*   Basic Metabolic Panel: Recent Labs  Lab 11/05/19 1850 11/06/19 0450 11/07/19 0416 11/08/19 0422 11/09/19 0324  NA 130* 131* 130* 130* 131*  K 3.9 3.7 3.5 3.4* 3.8  CL 93* 95* 95* 94* 95*  CO2 _0 GLUCOSE 368* 320* 310* 180* 95  BUN _1 CREATININE 0.89 0.90 0.96 0.91 0.89  CALCIUM 8.2* 8.1* 7.6* 7.4* 7.6*  MG  --   --  1.5* 1.9 2.0   GFR: Estimated Creatinine Clearance: 101.3 mL/min (by C-G formula based on SCr of 0.89 mg/dL). Liver Function Tests: Recent Labs  Lab 11/05/19 1850 11/06/19 0450  AST 13* 13*  ALT 13 11  ALKPHOS 105 86  BILITOT 0.9 1.1  PROT 9.1* 8.2*  ALBUMIN 2.7* 2.2*   No results for input(s): LIPASE, AMYLASE in the last 168 hours. No results for input(s): AMMONIA in the last 168 hours. Coagulation Profile: Recent Labs  Lab 11/05/19 1850 11/06/19 0450  INR 1.3* 1.4*   Cardiac Enzymes: Recent Labs  Lab 11/07/19 0416  CKTOTAL 40*   BNP (last 3 results) No results for input(s): PROBNP in the last 8760 hours. HbA1C: No results for input(s): HGBA1C in the last 72 hours. CBG: Recent Labs  Lab 11/09/19 0053 11/09/19 0525 11/09/19 0756 11/09/19 1142 11/09/19 1655  GLUCAP 108* 102* 124* 115* 122*   Lipid Profile: No results for input(s): CHOL, HDL, LDLCALC, TRIG, CHOLHDL, LDLDIRECT in the last 72 hours. Thyroid Function Tests: No results for input(s): TSH, T4TOTAL, FREET4, T3FREE, THYROIDAB in the last 72 hours. Anemia Panel: Recent Labs    11/07/19 0416  VITAMINB12 138*  FOLATE 6.4    TIBC 113*  IRON 10*   Sepsis Labs: Recent Labs  Lab 11/05/19 1850 11/06/19 0450  LATICACIDVEN 1.2 1.1    Recent Results (from the past 240 hour(s))  Culture, blood (Routine x 2)     Status: Abnormal   Collection Time: 11/05/19  6:50 PM   Specimen: BLOOD  Result Value Ref Range Status   Specimen Description   Final    BLOOD BLOOD LEFT FOREARM Performed at [facility], [address], [place], [place] [address]    Special Requests   Final    BOTTLES DRAWN AEROBIC AND ANAEROBIC Blood Culture adequate volume Performed at [facility], [address] [place], [place] [address]    Culture  Setup Time  Final    GRAM POSITIVE COCCI IN BOTH AEROBIC AND ANAEROBIC BOTTLES Organism ID to follow CRITICAL RESULT CALLED TO, READ BACK BY AND VERIFIED WITH: KAREN HAYES 11/06/19 [place] Performed at [facility], [address] [place], [place] [address]    Culture STAPHYLOCOCCUS AUREUS (A)  Final   Report Status 11/08/2019 FINAL  Final   Organism ID, Bacteria STAPHYLOCOCCUS AUREUS  Final      Susceptibility   Staphylococcus aureus - MIC*    CIPROFLOXACIN <=0.5 SENSITIVE Sensitive     ERYTHROMYCIN >=8 RESISTANT Resistant     GENTAMICIN <=0.5 SENSITIVE Sensitive     OXACILLIN <=0.25 SENSITIVE Sensitive     TETRACYCLINE <=1 SENSITIVE Sensitive     VANCOMYCIN <=0.5 SENSITIVE Sensitive     TRIMETH/SULFA <=10 SENSITIVE Sensitive     CLINDAMYCIN <=0.25 SENSITIVE Sensitive     RIFAMPIN <=0.5 SENSITIVE Sensitive     Inducible Clindamycin NEGATIVE Sensitive     * STAPHYLOCOCCUS AUREUS  Blood Culture ID Panel (Reflexed)     Status: Abnormal   Collection Time: 11/05/19  6:50 PM  Result Value Ref Range Status   Enterococcus faecalis NOT DETECTED NOT DETECTED Final   Enterococcus Faecium NOT DETECTED NOT DETECTED Final   Listeria monocytogenes NOT DETECTED NOT DETECTED Final   Staphylococcus species DETECTED (A) NOT DETECTED Final    Comment: CRITICAL  RESULT CALLED TO, READ BACK BY AND VERIFIED WITH: KAREN HAYES 11/06/19 0752 KLW    Staphylococcus aureus (BCID) DETECTED (A) NOT DETECTED Final    Comment: CRITICAL RESULT CALLED TO, READ BACK BY AND VERIFIED WITH: KAREN HAYES 11/06/19 0752 KLW    Staphylococcus epidermidis NOT DETECTED NOT DETECTED Final   Staphylococcus lugdunensis NOT DETECTED NOT DETECTED Final   Streptococcus species NOT DETECTED NOT DETECTED Final   Streptococcus agalactiae NOT DETECTED NOT DETECTED Final   Streptococcus pneumoniae NOT DETECTED NOT DETECTED Final   Streptococcus pyogenes NOT DETECTED NOT DETECTED Final   A.calcoaceticus-baumannii NOT DETECTED NOT DETECTED Final   Bacteroides fragilis NOT DETECTED NOT DETECTED Final   Enterobacterales NOT DETECTED NOT DETECTED Final   Enterobacter cloacae complex NOT DETECTED NOT DETECTED Final   Escherichia coli NOT DETECTED NOT DETECTED Final   Klebsiella aerogenes NOT DETECTED NOT DETECTED Final   Klebsiella oxytoca NOT DETECTED NOT DETECTED Final   Klebsiella pneumoniae NOT DETECTED NOT DETECTED Final   Proteus species NOT DETECTED NOT DETECTED Final   Salmonella species NOT DETECTED NOT DETECTED Final   Serratia marcescens NOT DETECTED NOT DETECTED Final   Haemophilus influenzae NOT DETECTED NOT DETECTED Final   Neisseria meningitidis NOT DETECTED NOT DETECTED Final   Pseudomonas aeruginosa NOT DETECTED NOT DETECTED Final   Stenotrophomonas maltophilia NOT DETECTED NOT DETECTED Final   Candida albicans NOT DETECTED NOT DETECTED Final   Candida auris NOT DETECTED NOT DETECTED Final   Candida glabrata NOT DETECTED NOT DETECTED Final   Candida krusei NOT DETECTED NOT DETECTED Final   Candida parapsilosis NOT DETECTED NOT DETECTED Final   Candida tropicalis NOT DETECTED NOT DETECTED Final   Cryptococcus neoformans/gattii NOT DETECTED NOT DETECTED Final   Meth resistant mecA/C and MREJ NOT DETECTED NOT DETECTED Final    Comment: Performed at [facility], [address] [place], [place] [address]  Culture, blood (Routine x 2)     Status: Abnormal   Collection Time: 11/05/19  8:47 PM   Specimen: BLOOD  Result Value Ref Range Status   Specimen Description   Final  BLOOD LEFT FA Performed at [facility], [address], [place], [place] [address]    Special Requests   Final    BOTTLES DRAWN AEROBIC AND ANAEROBIC Blood Culture adequate volume Performed at [facility], [address] [place], [place] [address]    Culture  Setup Time   Final    GRAM POSITIVE COCCI IN BOTH AEROBIC AND ANAEROBIC BOTTLES CRITICAL VALUE NOTED.  VALUE IS CONSISTENT WITH PREVIOUSLY REPORTED AND CALLED VALUE. Performed at [facility], [address] [place], [place] [address]    Culture (A)  Final    STAPHYLOCOCCUS AUREUS SUSCEPTIBILITIES PERFORMED ON PREVIOUS CULTURE WITHIN THE LAST 5 DAYS. Performed at [facility], [place] [address] [place], [place] [address]    Report Status 11/08/2019 FINAL  Final  Respiratory Panel by RT PCR (Flu A&B, Covid) - Nasopharyngeal Swab     Status: None   Collection Time: 11/05/19  9:27 PM   Specimen: Nasopharyngeal Swab  Result Value Ref Range Status   SARS Coronavirus 2 by RT PCR NEGATIVE NEGATIVE Final    Comment: (NOTE) SARS-CoV-2 target nucleic acids are NOT DETECTED.  The SARS-CoV-2 RNA is generally detectable in upper respiratoy specimens during the acute phase of infection. The lowest concentration of SARS-CoV-2 viral copies this assay can detect is 131 copies/mL. A negative result does not preclude SARS-Cov-2 infection and should not be used as the sole basis for treatment or other patient management decisions. A negative result may occur with  improper specimen collection/handling, submission of specimen other than nasopharyngeal swab, presence of viral mutation(s) within the areas targeted by this assay, and inadequate number of viral copies (<131 copies/mL). A  negative result must be combined with clinical observations, patient history, and epidemiological information. The expected result is Negative.  Fact Sheet for Patients:  PinkCheek.be  Fact Sheet for Healthcare Providers:  GravelBags.it  This test is no t yet approved or cleared by the [place] FDA and  has been authorized for detection and/or diagnosis of SARS-CoV-2 by FDA under an Emergency Use Authorization (EUA). This EUA will remain  in effect (meaning this test can be used) for the duration of the COVID-19 declaration under Section 564(b)(1) of the Act, 21 U.S.C. section 360bbb-3(b)(1), unless the authorization is terminated or revoked sooner.     Influenza A by PCR NEGATIVE NEGATIVE Final   Influenza B by PCR NEGATIVE NEGATIVE Final    Comment: (NOTE) The Xpert Xpress SARS-CoV-2/FLU/RSV assay is intended as an aid in  the diagnosis of influenza from Nasopharyngeal swab specimens and  should not be used as a sole basis for treatment. Nasal washings and  aspirates are unacceptable for Xpert Xpress SARS-CoV-2/FLU/RSV  testing.  Fact Sheet for Patients: PinkCheek.be  Fact Sheet for Healthcare Providers: GravelBags.it  This test is not yet approved or cleared by the [place] FDA and  has been authorized for detection and/or diagnosis of SARS-CoV-2 by  FDA under an Emergency Use Authorization (EUA). This EUA will remain  in effect (meaning this test can be used) for the duration of the  Covid-19 declaration under Section 564(b)(1) of the Act, 21  U.S.C. section 360bbb-3(b)(1), unless the authorization is  terminated or revoked. Performed at [facility], [place], [place] [address]   C Difficile Quick Screen w PCR reflex     Status: None   Collection Time: 11/06/19  9:48 AM   Specimen: STOOL  Result Value Ref Range Status  C Diff antigen NEGATIVE NEGATIVE Final   C Diff toxin NEGATIVE NEGATIVE Final   C Diff interpretation No C. difficile detected.  Final    Comment: Performed at [facility], [address] [place], [place] [address]  Aerobic/Anaerobic Culture (surgical/deep wound)     Status: None (Preliminary result)   Collection Time: 11/06/19  5:55 PM   Specimen: Toe  Result Value Ref Range Status   Specimen Description TOE LEFT GREAT TOE  Final   Special Requests NONE  Final   Gram Stain   Final    RARE WBC PRESENT,BOTH PMN AND MONONUCLEAR RARE GRAM POSITIVE COCCI    Culture   Final    FEW STAPHYLOCOCCUS AUREUS RARE STREPTOCOCCUS MITIS/ORALIS RARE KLEBSIELLA PNEUMONIAE NO ANAEROBES ISOLATED; CULTURE IN PROGRESS FOR 5 DAYS    Report Status PENDING  Incomplete   Organism ID, Bacteria STAPHYLOCOCCUS AUREUS  Final   Organism ID, Bacteria STREPTOCOCCUS MITIS/ORALIS  Final   Organism ID, Bacteria KLEBSIELLA PNEUMONIAE  Final      Susceptibility   Klebsiella pneumoniae - MIC*    AMPICILLIN >=32 RESISTANT Resistant     CEFAZOLIN <=4 SENSITIVE Sensitive     CEFEPIME <=0.12 SENSITIVE Sensitive     CEFTAZIDIME <=1 SENSITIVE Sensitive     CEFTRIAXONE <=0.25 SENSITIVE Sensitive     CIPROFLOXACIN <=0.25 SENSITIVE Sensitive     GENTAMICIN <=1 SENSITIVE Sensitive     IMIPENEM <=0.25 SENSITIVE Sensitive     TRIMETH/SULFA <=20 SENSITIVE Sensitive     AMPICILLIN/SULBACTAM 4 SENSITIVE Sensitive     PIP/TAZO <=4 SENSITIVE Sensitive     * RARE KLEBSIELLA PNEUMONIAE   Staphylococcus aureus - MIC*    CIPROFLOXACIN <=0.5 SENSITIVE Sensitive     ERYTHROMYCIN 4 INTERMEDIATE Intermediate     GENTAMICIN <=0.5 SENSITIVE Sensitive     OXACILLIN 0.5 SENSITIVE Sensitive     TETRACYCLINE <=1 SENSITIVE Sensitive     VANCOMYCIN <=0.5 SENSITIVE Sensitive     TRIMETH/SULFA <=10 SENSITIVE Sensitive     CLINDAMYCIN <=0.25 SENSITIVE Sensitive     RIFAMPIN <=0.5 SENSITIVE Sensitive     Inducible Clindamycin  Value in next row Sensitive      NEGATIVEPerformed at [facility], [address] [place], [place] [address]    * FEW STAPHYLOCOCCUS AUREUS   Streptococcus mitis/oralis - MIC*    TETRACYCLINE Value in next row Sensitive      NEGATIVEPerformed at [place] [address], [place], [place] [address]    VANCOMYCIN Value in next row Sensitive      NEGATIVEPerformed at [place] [address], [place], [place] [address]    CLINDAMYCIN Value in next row Sensitive      NEGATIVEPerformed at [place] [address] [place],  [address]    PENICILLIN Value in next row Sensitive      SENSITIVE0.06    CEFTRIAXONE Value in next row Sensitive      SENSITIVE0.12    * RARE STREPTOCOCCUS MITIS/ORALIS  Surgical pcr screen     Status: Abnormal   Collection Time: 11/07/19  3:40 PM   Specimen: Nasal Mucosa; Nasal Swab  Result Value Ref Range Status   MRSA, PCR NEGATIVE NEGATIVE Final   Staphylococcus aureus POSITIVE (A) NEGATIVE Final    Comment: (NOTE) The Xpert SA Assay (FDA approved for NASAL specimens in patients 67 years of age and older), is one component of a comprehensive surveillance program. It is not intended to diagnose infection  nor to guide or monitor treatment. Performed at [facility], [address] [place], [place] [address]   Culture, blood (Routine X 2) w Reflex to ID Panel     Status: None (Preliminary result)   Collection Time: 11/08/19 12:11 AM   Specimen: BLOOD  Result Value Ref Range Status   Specimen Description BLOOD RIGHT ANTECUBITAL  Final   Special Requests   Final    BOTTLES DRAWN AEROBIC AND ANAEROBIC Blood Culture results may not be optimal due to an excessive volume of blood received in culture bottles   Culture   Final    NO GROWTH 1 DAY Performed at [facility], [address], [place], [place] [address]    Report Status PENDING  Incomplete  Culture, blood (Routine X 2) w Reflex to ID Panel      Status: None (Preliminary result)   Collection Time: 11/08/19 12:17 AM   Specimen: BLOOD  Result Value Ref Range Status   Specimen Description BLOOD LEFT ANTECUBITAL  Final   Special Requests   Final    BOTTLES DRAWN AEROBIC AND ANAEROBIC Blood Culture results may not be optimal due to an excessive volume of blood received in culture bottles   Culture   Final    NO GROWTH 1 DAY Performed at [facility], [address], [place], [place] [address]    Report Status PENDING  Incomplete      Radiology Studies: ECHOCARDIOGRAM COMPLETE  Result Date: 11/08/2019    ECHOCARDIOGRAM REPORT   Patient Name:   BLU LORI [place] Date of Exam: 11/08/2019 Medical Rec #:  [id number]   Height:       74.0 in Accession #:    [id number]  Weight:       190.0 lb Date of Birth:  April 08, 1960   BSA:          2.127 m Patient Age:    14 years    BP:           141/75 mmHg Patient Gender: M           HR:           101 bpm. Exam Location:  [facility] Procedure: 2D Echo, Color Doppler and Cardiac Doppler Indications:     R78.81 Bacteremia  History:         Patient has no prior history of Echocardiogram examinations.                  Risk Factors:Diabetes.  Sonographer:     Charmayne Sheer RDCS (AE) Referring Phys:  [id number] Tsosie Billing Diagnosing Phys: Bartholome Bill MD  Sonographer Comments: No subcostal window. IMPRESSIONS  1. Left ventricular ejection fraction, by estimation, is 50 to 55%. The left ventricle has low normal function. The left ventricle has no regional wall motion abnormalities. Left ventricular diastolic parameters are consistent with Grade I diastolic dysfunction (impaired relaxation).  2. Right ventricular systolic function is normal. The right ventricular size is normal.  3. The mitral valve is grossly normal. Trivial mitral valve regurgitation.  4. The aortic valve is grossly normal. Aortic valve regurgitation is not visualized. FINDINGS  Left Ventricle: Left ventricular ejection fraction, by estimation, is  50 to 55%. The left ventricle has low normal function. The left ventricle has no regional wall motion abnormalities. The left ventricular internal cavity size was normal in size. There is borderline left ventricular hypertrophy. Left ventricular diastolic parameters are consistent with Grade I diastolic dysfunction (impaired relaxation). Right Ventricle:  The right ventricular size is normal. No increase in right ventricular wall thickness. Right ventricular systolic function is normal. Left Atrium: Left atrial size was normal in size. Right Atrium: Right atrial size was normal in size. Pericardium: There is no evidence of pericardial effusion. Mitral Valve: The mitral valve is grossly normal. Trivial mitral valve regurgitation. MV peak gradient, 3.8 mmHg. The mean mitral valve gradient is 2.0 mmHg. There is no evidence of mitral valve vegetation. Tricuspid Valve: The tricuspid valve is grossly normal. Tricuspid valve regurgitation is trivial. There is no evidence of tricuspid valve vegetation. Aortic Valve: The aortic valve is grossly normal. Aortic valve regurgitation is not visualized. Aortic valve mean gradient measures 2.0 mmHg. Aortic valve peak gradient measures 3.6 mmHg. Aortic valve area, by VTI measures 3.81 cm. Pulmonic Valve: The pulmonic valve was not well visualized. Pulmonic valve regurgitation is trivial. Aorta: The aortic root is normal in size and structure. IAS/Shunts: The interatrial septum was not assessed.  LEFT VENTRICLE PLAX 2D LVIDd:         4.72 cm  Diastology LVIDs:         3.47 cm  LV e' medial:    5.11 cm/s LV PW:         1.33 cm  LV E/e' medial:  12.7 LV IVS:        0.92 cm  LV e' lateral:   8.59 cm/s LVOT diam:     2.50 cm  LV E/e' lateral: 7.6 LV SV:         63 LV SV Index:   30 LVOT Area:     4.91 cm  RIGHT VENTRICLE RV Basal diam:  2.98 cm LEFT ATRIUM             Index       RIGHT ATRIUM           Index LA diam:        2.90 cm 1.36 cm/m  RA Area:     14.90 cm LA Vol (A2C):   44.9  ml 21.11 ml/m RA Volume:   36.30 ml  17.07 ml/m LA Vol (A4C):   32.4 ml 15.24 ml/m LA Biplane Vol: 38.9 ml 18.29 ml/m  AORTIC VALVE                   PULMONIC VALVE AV Area (Vmax):    3.99 cm    PV Vmax:       0.75 m/s AV Area (Vmean):   4.05 cm    PV Vmean:      55.400 cm/s AV Area (VTI):     3.81 cm    PV VTI:        0.138 m AV Vmax:           95.50 cm/s  PV Peak grad:  2.2 mmHg AV Vmean:          56.400 cm/s PV Mean grad:  1.0 mmHg AV VTI:            0.165 m AV Peak Grad:      3.6 mmHg AV Mean Grad:      2.0 mmHg LVOT Vmax:         77.60 cm/s LVOT Vmean:        46.500 cm/s LVOT VTI:          0.128 m LVOT/AV VTI ratio: 0.78  AORTA Ao Root diam: 3.90 cm MITRAL VALVE MV Area (PHT): 13.55 cm   SHUNTS MV Peak grad:  3.8 mmHg    Systemic VTI:  0.13 m MV Mean grad:  2.0 mmHg    Systemic Diam: 2.50 cm MV Vmax:       0.97 m/s MV Vmean:      62.7 cm/s MV Decel Time: 56 msec MV E velocity: 65.00 cm/s MV A velocity: 76.60 cm/s MV E/A ratio:  0.85 Bartholome Bill MD Electronically signed by Bartholome Bill MD Signature Date/Time: 11/08/2019/12:45:33 PM    Final      Scheduled Meds:  brimonidine  1 drop Both Eyes BID   chlorhexidine  60 mL Topical Once   cholecalciferol  1,000 Units Oral Daily   enoxaparin (LOVENOX) injection  40 mg Subcutaneous Q24H   insulin aspart  0-15 Units Subcutaneous TID PC,HS,0200   insulin detemir  10 Units Subcutaneous BID   iron polysaccharides  150 mg Oral Daily   povidone-iodine  2 application Topical Once   timolol  1 drop Both Eyes BID   Continuous Infusions:  sodium chloride 500 mL (11/09/19 1350)    ceFAZolin (ANCEF) IV 2 g (11/09/19 1354)   metronidazole 500 mg (11/09/19 0955)     LOS: 4 days     Enzo Bi, MD [facility] If 7PM-7AM, please contact night-coverage 11/09/2019, 5:26 PM

## 2019-11-09 NOTE — Progress Notes (Signed)
   11/09/19 1244  Document  Patient Outcome Stabilized after interventions  Progress note created (see row info) Yes

## 2019-11-09 NOTE — Progress Notes (Deleted)
   11/09/19 0757  Assess: MEWS Score  Temp 99.5 F (37.5 C)  BP (!) 155/81  Pulse Rate (!) 111  Resp 18  SpO2 93 %  O2 Device Room Air  Assess: MEWS Score  MEWS Temp 0  MEWS Systolic 0  MEWS Pulse 2  MEWS RR 0  MEWS LOC 0  MEWS Score 2  MEWS Score Color Yellow  Assess: if the MEWS score is Yellow or Red  Were vital signs taken at a resting state? Yes  Focused Assessment No change from prior assessment  Early Detection of Sepsis Score *See Row Information* Low  MEWS guidelines implemented *See Row Information* Yes  Treat  MEWS Interventions Administered prn meds/treatments  Pain Scale 0-10  Pain Score 6  Pain Type Acute pain  Pain Location Groin  Pain Orientation Right  Pain Descriptors / Indicators Discomfort  Pain Intervention(s) Medication (See eMAR)  Breathing 0  Negative Vocalization 0  Body Language 0  Take Vital Signs  Increase Vital Sign Frequency  Yellow: Q 2hr X 2 then Q 4hr X 2, if remains yellow, continue Q 4hrs  Escalate  MEWS: Escalate Yellow: discuss with charge nurse/RN and consider discussing with provider and RRT  Notify: Charge Nurse/RN  Name of Charge Nurse/RN Notified Lucy Antigua RN  Date Charge Nurse/RN Notified 11/09/19  Time Charge Nurse/RN Notified 0757  Notify: Provider  Provider Name/Title Darlin Priestly MD  Date Provider Notified 11/09/19  Time Provider Notified [phone]  Notification Type  (Secure chat)  Notification Reason Change in status  Response No new orders  Date of Provider Response 11/09/19  Time of Provider Response [phone]

## 2019-11-10 DIAGNOSIS — M869 Osteomyelitis, unspecified: Secondary | ICD-10-CM | POA: Diagnosis not present

## 2019-11-10 LAB — CBC
HCT: 28.4 % — ABNORMAL LOW (ref 39.0–52.0)
Hemoglobin: 9.2 g/dL — ABNORMAL LOW (ref 13.0–17.0)
MCH: 24.1 pg — ABNORMAL LOW (ref 26.0–34.0)
MCHC: 32.4 g/dL (ref 30.0–36.0)
MCV: 74.3 fL — ABNORMAL LOW (ref 80.0–100.0)
Platelets: 534 10*3/uL — ABNORMAL HIGH (ref 150–400)
RBC: 3.82 MIL/uL — ABNORMAL LOW (ref 4.22–5.81)
RDW: 14.1 % (ref 11.5–15.5)
WBC: 13.4 10*3/uL — ABNORMAL HIGH (ref 4.0–10.5)
nRBC: 0 % (ref 0.0–0.2)

## 2019-11-10 LAB — GLUCOSE, CAPILLARY
Glucose-Capillary: 116 mg/dL — ABNORMAL HIGH (ref 70–99)
Glucose-Capillary: 128 mg/dL — ABNORMAL HIGH (ref 70–99)
Glucose-Capillary: 88 mg/dL (ref 70–99)
Glucose-Capillary: 102 mg/dL — ABNORMAL HIGH (ref 70–99)

## 2019-11-10 LAB — BASIC METABOLIC PANEL
Anion gap: 4 — ABNORMAL LOW (ref 5–15)
BUN: 10 mg/dL (ref 8–23)
CO2: 32 mmol/L (ref 22–32)
Calcium: 7.5 mg/dL — ABNORMAL LOW (ref 8.9–10.3)
Chloride: 95 mmol/L — ABNORMAL LOW (ref 98–111)
Creatinine, Ser: 0.97 mg/dL (ref 0.61–1.24)
GFR, Estimated: >60 mL/min (ref >60)
Glucose, Bld: 121 mg/dL — ABNORMAL HIGH (ref 70–99)
Potassium: 4.7 mmol/L (ref 3.5–5.1)
Sodium: 131 mmol/L — ABNORMAL LOW (ref 135–145)

## 2019-11-10 LAB — MAGNESIUM: Magnesium: 2 mg/dL (ref 1.7–2.4)

## 2019-11-10 NOTE — Progress Notes (Signed)
PROGRESS NOTE    Gary Sutton  YOO:[id number] DOB: 08-09-1960 DOA: 11/05/2019 PCP: Patient, No Pcp Per    Assessment & Plan:   Active Problems:   Toe osteomyelitis (HCC)   Gary Sutton  is a 59 y.o. male with a known history of type 2 diabetes mellitus, glaucoma and degenerative disc disease, who presented to the emergency room with acute onset of right lower extremity pain involving his leg with associated right foot swelling with erythema at the big toe.  The patient stated that he had pain around his right hip flexors and has not had any trauma.  He admitted to chills but did not check his temperature.  His right big toe has been swelling over the last week.  No chest pain or dyspnea or cough or wheezing.  No dysuria, oliguria or hematuria or flank pain.   # Sepsis 2/2  # Right big toe diabetic ulcer and osteomyelitis involving the first MTP # Staph bacteremia --fever and tachycardia as well as leukocytosis on presentation. --started on cefepime and vancomycin and switched to cefazolin, and then Flagyl added, per ID. --Despite repeated urging from podiatry, ID and I, pt continued to refuse surgical I/D of his right foot osteomyelitis.   PLAN: --cont cefazolin and Flagyl. --son to convince pt to proceed with surgical I/D --f/u repeat blood cx until neg x3 days. --will plan to discharge on Monday with home abx if pt continues to refuse surgery.  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  MRI lumbar spine, right hip showed the above findings. --CK not elevated. PLAN: --supportive care for now --Flexeril PRN and tramadol PRN  2.  Type 2 diabetes mellitus, poorly controlled --A1c 12.6 --Diabetic educator involved.  Pt is refusing to start insulin after discharge.  Pt reportedly also refused to take metformin at  home. PLAN: --cont Levemir 10u BID while inpatient --SSI --continue current insulin regimen --No need to order glucometer for home use because pt refuses insulin.  Will have pt follow up as outpatient.  3.  Glaucoma. -continue his Travatan and Timoptic ophthalmic gtts   # Anemia, microcytic, def in iron and vit B12 --anemia workup showed low vit B12 and low iron PLAN: --Vit B12 injection x1 today --continue iron suppl  # Suspect multiple myeloma --increased in urine protein, MRI spine showing possible marrow replacement processes. PLAN: --f/u SPEP   DVT prophylaxis: Lovenox SQ Code Status: Full code  Family Communication:  Status is: inpatient Dispo:   The patient is from: home Anticipated d/c is to: to be determined Anticipated d/c date is: 2 days Patient currently is not medically stable to d/c due to: bacteremia, osteomyelitis, need surgical resection, currently refusing.  Will discharge with home abx if still refusing surgery by then, after repeat blood cx neg x3 days.   Subjective and Interval History:  100.9 recorded yesterday.  Pt said he didn't feel he had a temperature.  Still undecided on surgery.   Objective: Vitals:   11/09/19 1244 11/09/19 1656 11/09/19 2017 11/10/19 0738  BP: 121/81 133/75 (!) 147/85 127/84  Pulse: (!) 106 94 96 82  Resp: _0 Temp: 99.2 F (37.3 C) 99.7 F (37.6 C) 98.7 F (37.1 C) 97.9 F (36.6 C)  TempSrc: Oral Oral Oral   SpO2: 99% 98% 98% 100%  Weight:  Height:        Intake/Output Summary (Last 24 hours) at 11/10/2019 1514 Last data filed at 11/10/2019 0907 Gross per 24 hour  Intake 465.58 ml  Output 1250 ml  Net -784.42 ml   Filed Weights   11/05/19 1841  Weight: 86.2 kg    Examination:   Constitutional: NAD, AAOx3 HEENT: conjunctivae and lids normal, EOMI CV: No cyanosis.   RESP: normal respiratory effort, on RA Extremities: right foot in gauze SKIN: warm, dry and intact Neuro: II - XII  grossly intact.     Data Reviewed: I have personally reviewed following labs and imaging studies  CBC: Recent Labs  Lab 11/05/19 1850 11/05/19 1850 11/06/19 0450 11/07/19 0416 11/08/19 0422 11/09/19 0324 11/10/19 0411  WBC 13.3*   < > 15.2* 14.4* 14.0* 12.3* 13.4*  NEUTROABS 11.5*  --  12.5*  --   --   --   --   HGB 10.7*   < > 10.0* 9.2* 9.9* 9.3* 9.2*  HCT 33.3*   < > 30.9* 27.4* 30.1* 29.5* 28.4*  MCV 74.5*   < > 74.1* 72.5* 73.4* 73.8* 74.3*  PLT 621*   < > 552* 489* 556* 548* 534*   < > = values in this interval not displayed.   Basic Metabolic Panel: Recent Labs  Lab 11/06/19 0450 11/07/19 0416 11/08/19 0422 11/09/19 0324 11/10/19 0411  NA 131* 130* 130* 131* 131*  K 3.7 3.5 3.4* 3.8 4.7  CL 95* 95* 94* 95* 95*  CO2 _0 32  GLUCOSE 320* 310* 180* 95 121*  BUN _1 CREATININE 0.90 0.96 0.91 0.89 0.97  CALCIUM 8.1* 7.6* 7.4* 7.6* 7.5*  MG  --  1.5* 1.9 2.0 2.0   GFR: Estimated Creatinine Clearance: 93 mL/min (by C-G formula based on SCr of 0.97 mg/dL). Liver Function Tests: Recent Labs  Lab 11/05/19 1850 11/06/19 0450  AST 13* 13*  ALT 13 11  ALKPHOS 105 86  BILITOT 0.9 1.1  PROT 9.1* 8.2*  ALBUMIN 2.7* 2.2*   No results for input(s): LIPASE, AMYLASE in the last 168 hours. No results for input(s): AMMONIA in the last 168 hours. Coagulation Profile: Recent Labs  Lab 11/05/19 1850 11/06/19 0450  INR 1.3* 1.4*   Cardiac Enzymes: Recent Labs  Lab 11/07/19 0416  CKTOTAL 40*   BNP (last 3 results) No results for input(s): PROBNP in the last 8760 hours. HbA1C: No results for input(s): HGBA1C in the last 72 hours. CBG: Recent Labs  Lab 11/09/19 1142 11/09/19 1655 11/09/19 2053 11/10/19 0738 11/10/19 1117  GLUCAP 115* 122* 103* 116* 128*   Lipid Profile: No results for input(s): CHOL, HDL, LDLCALC, TRIG, CHOLHDL, LDLDIRECT in the last 72 hours. Thyroid Function Tests: No results for input(s): TSH, T4TOTAL, FREET4,  T3FREE, THYROIDAB in the last 72 hours. Anemia Panel: No results for input(s): VITAMINB12, FOLATE, FERRITIN, TIBC, IRON, RETICCTPCT in the last 72 hours. Sepsis Labs: Recent Labs  Lab 11/05/19 1850 11/06/19 0450  LATICACIDVEN 1.2 1.1    Recent Results (from the past 240 hour(s))  Culture, blood (Routine x 2)     Status: Abnormal   Collection Time: 11/05/19  6:50 PM   Specimen: BLOOD  Result Value Ref Range Status   Specimen Description   Final    BLOOD BLOOD LEFT FOREARM Performed at [facility], [address], [place], [place] [address]    Special Requests   Final    BOTTLES DRAWN  AEROBIC AND ANAEROBIC Blood Culture adequate volume Performed at [facility], [address] [place], [place] [address]    Culture  Setup Time   Final    GRAM POSITIVE COCCI IN BOTH AEROBIC AND ANAEROBIC BOTTLES Organism ID to follow CRITICAL RESULT CALLED TO, READ BACK BY AND VERIFIED WITH: KAREN HAYES 11/06/19 0752 KLW Performed at [facility], [address] [place], [place] [address]    Culture STAPHYLOCOCCUS AUREUS (A)  Final   Report Status 11/08/2019 FINAL  Final   Organism ID, Bacteria STAPHYLOCOCCUS AUREUS  Final      Susceptibility   Staphylococcus aureus - MIC*    CIPROFLOXACIN <=0.5 SENSITIVE Sensitive     ERYTHROMYCIN >=8 RESISTANT Resistant     GENTAMICIN <=0.5 SENSITIVE Sensitive     OXACILLIN <=0.25 SENSITIVE Sensitive     TETRACYCLINE <=1 SENSITIVE Sensitive     VANCOMYCIN <=0.5 SENSITIVE Sensitive     TRIMETH/SULFA <=10 SENSITIVE Sensitive     CLINDAMYCIN <=0.25 SENSITIVE Sensitive     RIFAMPIN <=0.5 SENSITIVE Sensitive     Inducible Clindamycin NEGATIVE Sensitive     * STAPHYLOCOCCUS AUREUS  Blood Culture ID Panel (Reflexed)     Status: Abnormal   Collection Time: 11/05/19  6:50 PM  Result Value Ref Range Status   Enterococcus faecalis NOT DETECTED NOT DETECTED Final   Enterococcus Faecium NOT DETECTED NOT DETECTED Final   Listeria  monocytogenes NOT DETECTED NOT DETECTED Final   Staphylococcus species DETECTED (A) NOT DETECTED Final    Comment: CRITICAL RESULT CALLED TO, READ BACK BY AND VERIFIED WITH: KAREN HAYES 11/06/19 0752 KLW    Staphylococcus aureus (BCID) DETECTED (A) NOT DETECTED Final    Comment: CRITICAL RESULT CALLED TO, READ BACK BY AND VERIFIED WITH: KAREN HAYES 11/06/19 0752 KLW    Staphylococcus epidermidis NOT DETECTED NOT DETECTED Final   Staphylococcus lugdunensis NOT DETECTED NOT DETECTED Final   Streptococcus species NOT DETECTED NOT DETECTED Final   Streptococcus agalactiae NOT DETECTED NOT DETECTED Final   Streptococcus pneumoniae NOT DETECTED NOT DETECTED Final   Streptococcus pyogenes NOT DETECTED NOT DETECTED Final   A.calcoaceticus-baumannii NOT DETECTED NOT DETECTED Final   Bacteroides fragilis NOT DETECTED NOT DETECTED Final   Enterobacterales NOT DETECTED NOT DETECTED Final   Enterobacter cloacae complex NOT DETECTED NOT DETECTED Final   Escherichia coli NOT DETECTED NOT DETECTED Final   Klebsiella aerogenes NOT DETECTED NOT DETECTED Final   Klebsiella oxytoca NOT DETECTED NOT DETECTED Final   Klebsiella pneumoniae NOT DETECTED NOT DETECTED Final   Proteus species NOT DETECTED NOT DETECTED Final   Salmonella species NOT DETECTED NOT DETECTED Final   Serratia marcescens NOT DETECTED NOT DETECTED Final   Haemophilus influenzae NOT DETECTED NOT DETECTED Final   Neisseria meningitidis NOT DETECTED NOT DETECTED Final   Pseudomonas aeruginosa NOT DETECTED NOT DETECTED Final   Stenotrophomonas maltophilia NOT DETECTED NOT DETECTED Final   Candida albicans NOT DETECTED NOT DETECTED Final   Candida auris NOT DETECTED NOT DETECTED Final   Candida glabrata NOT DETECTED NOT DETECTED Final   Candida krusei NOT DETECTED NOT DETECTED Final   Candida parapsilosis NOT DETECTED NOT DETECTED Final   Candida tropicalis NOT DETECTED NOT DETECTED Final   Cryptococcus neoformans/gattii NOT DETECTED  NOT DETECTED Final   Meth resistant mecA/C and MREJ NOT DETECTED NOT DETECTED Final    Comment: Performed at [facility], [address] [place], [place] [address]  Culture, blood (Routine x 2)     Status:  Abnormal   Collection Time: 11/05/19  8:47 PM   Specimen: BLOOD  Result Value Ref Range Status   Specimen Description   Final    BLOOD LEFT FA Performed at [facility], [address] [place], [place] [address]    Special Requests   Final    BOTTLES DRAWN AEROBIC AND ANAEROBIC Blood Culture adequate volume Performed at [facility], [address] [place], [place] [address]    Culture  Setup Time   Final    GRAM POSITIVE COCCI IN BOTH AEROBIC AND ANAEROBIC BOTTLES CRITICAL VALUE NOTED.  VALUE IS CONSISTENT WITH PREVIOUSLY REPORTED AND CALLED VALUE. Performed at [facility], [address] [place], [place] [address]    Culture (A)  Final    STAPHYLOCOCCUS AUREUS SUSCEPTIBILITIES PERFORMED ON PREVIOUS CULTURE WITHIN THE LAST 5 DAYS. Performed at [facility], [place] [address], [place], [place] [address]    Report Status 11/08/2019 FINAL  Final  Respiratory Panel by RT PCR (Flu A&B, Covid) - Nasopharyngeal Swab     Status: None   Collection Time: 11/05/19  9:27 PM   Specimen: Nasopharyngeal Swab  Result Value Ref Range Status   SARS Coronavirus 2 by RT PCR NEGATIVE NEGATIVE Final    Comment: (NOTE) SARS-CoV-2 target nucleic acids are NOT DETECTED.  The SARS-CoV-2 RNA is generally detectable in upper respiratoy specimens during the acute phase of infection. The lowest concentration of SARS-CoV-2 viral copies this assay can detect is 131 copies/mL. A negative result does not preclude SARS-Cov-2 infection and should not be used as the sole basis for treatment or other patient management decisions. A negative result may occur with  improper specimen collection/handling, submission of specimen other than nasopharyngeal swab,  presence of viral mutation(s) within the areas targeted by this assay, and inadequate number of viral copies (<131 copies/mL). A negative result must be combined with clinical observations, patient history, and epidemiological information. The expected result is Negative.  Fact Sheet for Patients:  PinkCheek.be  Fact Sheet for Healthcare Providers:  GravelBags.it  This test is no t yet approved or cleared by the [place] FDA and  has been authorized for detection and/or diagnosis of SARS-CoV-2 by FDA under an Emergency Use Authorization (EUA). This EUA will remain  in effect (meaning this test can be used) for the duration of the COVID-19 declaration under Section 564(b)(1) of the Act, 21 U.S.C. section 360bbb-3(b)(1), unless the authorization is terminated or revoked sooner.     Influenza A by PCR NEGATIVE NEGATIVE Final   Influenza B by PCR NEGATIVE NEGATIVE Final    Comment: (NOTE) The Xpert Xpress SARS-CoV-2/FLU/RSV assay is intended as an aid in  the diagnosis of influenza from Nasopharyngeal swab specimens and  should not be used as a sole basis for treatment. Nasal washings and  aspirates are unacceptable for Xpert Xpress SARS-CoV-2/FLU/RSV  testing.  Fact Sheet for Patients: PinkCheek.be  Fact Sheet for Healthcare Providers: GravelBags.it  This test is not yet approved or cleared by the [place] FDA and  has been authorized for detection and/or diagnosis of SARS-CoV-2 by  FDA under an Emergency Use Authorization (EUA). This EUA will remain  in effect (meaning this test can be used) for the duration of the  Covid-19 declaration under Section 564(b)(1) of the Act, 21  U.S.C. section 360bbb-3(b)(1), unless the authorization is  terminated or revoked. Performed at [facility], [address], [place], [place] [address]   C Difficile  Quick  Screen w PCR reflex     Status: None   Collection Time: 11/06/19  9:48 AM   Specimen: STOOL  Result Value Ref Range Status   C Diff antigen NEGATIVE NEGATIVE Final   C Diff toxin NEGATIVE NEGATIVE Final   C Diff interpretation No C. difficile detected.  Final    Comment: Performed at [facility], [address] [place], [place] [address]  Aerobic/Anaerobic Culture (surgical/deep wound)     Status: None (Preliminary result)   Collection Time: 11/06/19  5:55 PM   Specimen: Toe  Result Value Ref Range Status   Specimen Description TOE LEFT GREAT TOE  Final   Special Requests NONE  Final   Gram Stain   Final    RARE WBC PRESENT,BOTH PMN AND MONONUCLEAR RARE GRAM POSITIVE COCCI    Culture   Final    FEW STAPHYLOCOCCUS AUREUS RARE STREPTOCOCCUS MITIS/ORALIS RARE KLEBSIELLA PNEUMONIAE NO ANAEROBES ISOLATED; CULTURE IN PROGRESS FOR 5 DAYS    Report Status PENDING  Incomplete   Organism ID, Bacteria STAPHYLOCOCCUS AUREUS  Final   Organism ID, Bacteria STREPTOCOCCUS MITIS/ORALIS  Final   Organism ID, Bacteria KLEBSIELLA PNEUMONIAE  Final      Susceptibility   Klebsiella pneumoniae - MIC*    AMPICILLIN >=32 RESISTANT Resistant     CEFAZOLIN <=4 SENSITIVE Sensitive     CEFEPIME <=0.12 SENSITIVE Sensitive     CEFTAZIDIME <=1 SENSITIVE Sensitive     CEFTRIAXONE <=0.25 SENSITIVE Sensitive     CIPROFLOXACIN <=0.25 SENSITIVE Sensitive     GENTAMICIN <=1 SENSITIVE Sensitive     IMIPENEM <=0.25 SENSITIVE Sensitive     TRIMETH/SULFA <=20 SENSITIVE Sensitive     AMPICILLIN/SULBACTAM 4 SENSITIVE Sensitive     PIP/TAZO <=4 SENSITIVE Sensitive     * RARE KLEBSIELLA PNEUMONIAE   Staphylococcus aureus - MIC*    CIPROFLOXACIN <=0.5 SENSITIVE Sensitive     ERYTHROMYCIN 4 INTERMEDIATE Intermediate     GENTAMICIN <=0.5 SENSITIVE Sensitive     OXACILLIN 0.5 SENSITIVE Sensitive     TETRACYCLINE <=1 SENSITIVE Sensitive     VANCOMYCIN <=0.5 SENSITIVE Sensitive     TRIMETH/SULFA <=10  SENSITIVE Sensitive     CLINDAMYCIN <=0.25 SENSITIVE Sensitive     RIFAMPIN <=0.5 SENSITIVE Sensitive     Inducible Clindamycin Value in next row Sensitive      NEGATIVEPerformed at [facility], [address], [place], [place] [address]    * FEW STAPHYLOCOCCUS AUREUS   Streptococcus mitis/oralis - MIC*    TETRACYCLINE Value in next row Sensitive      NEGATIVEPerformed at [facility] [address], [place], [place] [address]    VANCOMYCIN Value in next row Sensitive      NEGATIVEPerformed at [facility] [address] [place], [place] [address]    CLINDAMYCIN Value in next row Sensitive      NEGATIVEPerformed at [facility] [address], [place], [place] [address]    PENICILLIN Value in next row Sensitive      SENSITIVE0.06    CEFTRIAXONE Value in next row Sensitive      SENSITIVE0.12    * RARE STREPTOCOCCUS MITIS/ORALIS  Surgical pcr screen     Status: Abnormal   Collection Time: 11/07/19  3:40 PM   Specimen: Nasal Mucosa; Nasal Swab  Result Value Ref Range Status   MRSA, PCR NEGATIVE NEGATIVE Final   Staphylococcus aureus POSITIVE (A) NEGATIVE Final    Comment: (NOTE) The Xpert  SA Assay (FDA approved for NASAL specimens in patients 61 years of age and older), is one component of a comprehensive surveillance program. It is not intended to diagnose infection nor to guide or monitor treatment. Performed at [facility], [address] [place], [place] [address]   Culture, blood (Routine X 2) w Reflex to ID Panel     Status: None (Preliminary result)   Collection Time: 11/08/19 12:11 AM   Specimen: BLOOD  Result Value Ref Range Status   Specimen Description BLOOD RIGHT ANTECUBITAL  Final   Special Requests   Final    BOTTLES DRAWN AEROBIC AND ANAEROBIC Blood Culture results may not be optimal due to an excessive volume of blood received in culture bottles   Culture   Final    NO GROWTH 2 DAYS Performed at [facility], [address] [place], [place] [address]    Report Status PENDING  Incomplete  Culture, blood (Routine X 2) w Reflex to ID Panel     Status: None (Preliminary result)   Collection Time: 11/08/19 12:17 AM   Specimen: BLOOD  Result Value Ref Range Status   Specimen Description BLOOD LEFT ANTECUBITAL  Final   Special Requests   Final    BOTTLES DRAWN AEROBIC AND ANAEROBIC Blood Culture results may not be optimal due to an excessive volume of blood received in culture bottles   Culture   Final    NO GROWTH 2 DAYS Performed at [facility], [address], [place],  [address]    Report Status PENDING  Incomplete      Radiology Studies: No results found.   Scheduled Meds: . brimonidine  1 drop Both Eyes BID  . chlorhexidine  60 mL Topical Once  . cholecalciferol  1,000 Units Oral Daily  . enoxaparin (LOVENOX) injection  40 mg Subcutaneous Q24H  . insulin aspart  0-15 Units Subcutaneous TID PC,HS,0200  . insulin detemir  10 Units Subcutaneous BID  . iron polysaccharides  150 mg Oral Daily  . povidone-iodine  2 application Topical Once  . timolol  1 drop Both Eyes BID   Continuous Infusions: . sodium chloride 500 mL (11/09/19 1350)  .  ceFAZolin (ANCEF) IV 2 g (11/10/19 1403)  . metronidazole 500 mg (11/10/19 0907)     LOS: 5 days     Enzo Bi, MD [facility] If 7PM-7AM, please contact night-coverage 11/10/2019, 3:14 PM

## 2019-11-11 ENCOUNTER — Inpatient Hospital Stay: Payer: Self-pay

## 2019-11-11 DIAGNOSIS — D649 Anemia, unspecified: Secondary | ICD-10-CM | POA: Diagnosis not present

## 2019-11-11 DIAGNOSIS — B9561 Methicillin susceptible Staphylococcus aureus infection as the cause of diseases classified elsewhere: Secondary | ICD-10-CM | POA: Diagnosis not present

## 2019-11-11 DIAGNOSIS — M869 Osteomyelitis, unspecified: Secondary | ICD-10-CM | POA: Diagnosis not present

## 2019-11-11 DIAGNOSIS — R7881 Bacteremia: Secondary | ICD-10-CM | POA: Diagnosis not present

## 2019-11-11 LAB — CBC
HCT: 30.3 % — ABNORMAL LOW (ref 39.0–52.0)
Hemoglobin: 9.5 g/dL — ABNORMAL LOW (ref 13.0–17.0)
MCH: 23.5 pg — ABNORMAL LOW (ref 26.0–34.0)
MCHC: 31.4 g/dL (ref 30.0–36.0)
MCV: 75 fL — ABNORMAL LOW (ref 80.0–100.0)
Platelets: 597 10*3/uL — ABNORMAL HIGH (ref 150–400)
RBC: 4.04 MIL/uL — ABNORMAL LOW (ref 4.22–5.81)
RDW: 13.8 % (ref 11.5–15.5)
WBC: 10.3 10*3/uL (ref 4.0–10.5)
nRBC: 0 % (ref 0.0–0.2)

## 2019-11-11 LAB — BASIC METABOLIC PANEL
Anion gap: 7 (ref 5–15)
BUN: 12 mg/dL (ref 8–23)
CO2: 30 mmol/L (ref 22–32)
Calcium: 7.9 mg/dL — ABNORMAL LOW (ref 8.9–10.3)
Chloride: 94 mmol/L — ABNORMAL LOW (ref 98–111)
Creatinine, Ser: 1.01 mg/dL (ref 0.61–1.24)
GFR, Estimated: >60 mL/min (ref >60)
Glucose, Bld: 148 mg/dL — ABNORMAL HIGH (ref 70–99)
Potassium: 4.7 mmol/L (ref 3.5–5.1)
Sodium: 131 mmol/L — ABNORMAL LOW (ref 135–145)

## 2019-11-11 LAB — GLUCOSE, CAPILLARY
Glucose-Capillary: 162 mg/dL — ABNORMAL HIGH (ref 70–99)
Glucose-Capillary: 147 mg/dL — ABNORMAL HIGH (ref 70–99)
Glucose-Capillary: 176 mg/dL — ABNORMAL HIGH (ref 70–99)
Glucose-Capillary: 128 mg/dL — ABNORMAL HIGH (ref 70–99)

## 2019-11-11 LAB — AEROBIC/ANAEROBIC CULTURE W GRAM STAIN (SURGICAL/DEEP WOUND)

## 2019-11-11 LAB — MAGNESIUM: Magnesium: 2.2 mg/dL (ref 1.7–2.4)

## 2019-11-11 MED ORDER — SODIUM CHLORIDE 0.9% FLUSH
10.0000 mL | Freq: Two times a day (BID) | INTRAVENOUS | Status: DC
  Administered 2019-11-11 – 2019-11-18 (×8): 10 mL

## 2019-11-11 MED ORDER — CHLORHEXIDINE GLUCONATE CLOTH 2 % EX PADS
6.0000 | MEDICATED_PAD | Freq: Every day | CUTANEOUS | Status: DC
  Administered 2019-11-11 – 2019-11-20 (×8): 6 via TOPICAL

## 2019-11-11 MED ORDER — SODIUM CHLORIDE 0.9% FLUSH: 10.0000 mL | INTRAVENOUS | Status: DC | PRN

## 2019-11-11 NOTE — Progress Notes (Signed)
PROGRESS NOTE    Gary Sutton  XJO:[id number] DOB: [birth year] DOA: 11/05/2019 PCP: Patient, No Pcp Per    Assessment & Plan:   Active Problems:   Toe osteomyelitis (HCC)   [name]  is a 59 y.o. male with a known history of type 2 diabetes mellitus, glaucoma and degenerative disc disease, who presented to the emergency room with acute onset of right lower extremity pain involving his leg with associated right foot swelling with erythema at the big toe.  The patient stated that he had pain around his right hip flexors and has not had any trauma.  He admitted to chills but did not check his temperature.  His right big toe has been swelling over the last week.  No chest pain or dyspnea or cough or wheezing.  No dysuria, oliguria or hematuria or flank pain.   # Sepsis 2/2  # Right big toe diabetic ulcer and osteomyelitis involving the first MTP # Staph bacteremia --fever and tachycardia as well as leukocytosis on presentation. --started on cefepime and vancomycin and switched to cefazolin, and then Flagyl added, per ID. --Despite repeated urging from podiatry, ID and I, pt had been refusing surgical I/D of his right foot osteomyelitis.   PLAN: --cont cefazolin and Flagyl. --PICC line today --Pt changed his mind later today and agreed to surgery.  Dr. Raynelle Bring.  Pt made NPO MN just in case he can be taken to OR tomorrow.  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  MRI lumbar spine, right hip showed the above findings. --CK not elevated. PLAN: --supportive care for now --Flexeril PRN and tramadol PRN  2.  Type 2 diabetes mellitus, poorly controlled -on home metformin, but reportedly didn't take. --A1c 12.6 --Diabetic educator involved.  Pt is refusing to start insulin after discharge. PLAN: --cont Levemir  10u BID while inpatient --SSI --cont current regimen --No need to order glucometer for home use because pt refuses insulin for home use.  Will have pt follow up as outpatient.  3.  Glaucoma. -continue his Travatan and Timoptic ophthalmic gtts   # Anemia, microcytic, def in iron and vit B12 --anemia workup showed low vit B12 and low iron PLAN: --Vit B12 injection x1 today --cont oral iron suppl  # Suspect multiple myeloma --increased in urine protein, MRI spine showing possible marrow replacement processes. PLAN: --f/u SPEP   DVT prophylaxis: Lovenox SQ Code Status: Full code  Family Communication:  Status is: inpatient Dispo:   The patient is from: home Anticipated d/c is to: SNF rehab Anticipated d/c date is: >3 days Patient currently is not medically stable to d/c due to: bacteremia, osteomyelitis, need surgical resection, has been refusing surgery until today.   Subjective and Interval History:  PICC line placed, and ID and pharm engaged in abx plan for discharge.  PT ordered to see if pt qualifies for SNF.  Pt then told PT he changed his mind and is now willing to go through with surgery.    Dr. Raynelle Bring.     Objective: Vitals:   11/10/19 1530 11/10/19 1600 11/11/19 0009 11/11/19 0727  BP: 108/65  127/72 138/80  Pulse: 89  89 94  Resp: _0 Temp: 97.9 F (36.6 C)  98.7 F (37.1 C) 98.3 F (36.8 C)  TempSrc:   Oral  Oral  SpO2: 98% 97% 95% 97%  Weight:      Height:        Intake/Output Summary (Last 24 hours) at 11/11/2019 1644 Last data filed at 11/11/2019 1040 Gross per 24 hour  Intake 929.49 ml  Output 1800 ml  Net -870.51 ml   Filed Weights   11/05/19 1841  Weight: 86.2 kg    Examination:   Constitutional: NAD, AAOx3 HEENT: visually impaired CV: No cyanosis.   RESP: normal respiratory effort, on RA Extremities: right foot in gauze SKIN: warm, dry and intact Neuro: II - XII grossly intact.     Data Reviewed: I have  personally reviewed following labs and imaging studies  CBC: Recent Labs  Lab 11/05/19 1850 11/05/19 1850 11/06/19 0450 11/06/19 0450 11/07/19 0416 11/08/19 0422 11/09/19 0324 11/10/19 0411 11/11/19 0150  WBC 13.3*   < > 15.2*   < > 14.4* 14.0* 12.3* 13.4* 10.3  NEUTROABS 11.5*  --  12.5*  --   --   --   --   --   --   HGB 10.7*   < > 10.0*   < > 9.2* 9.9* 9.3* 9.2* 9.5*  HCT 33.3*   < > 30.9*   < > 27.4* 30.1* 29.5* 28.4* 30.3*  MCV 74.5*   < > 74.1*   < > 72.5* 73.4* 73.8* 74.3* 75.0*  PLT 621*   < > 552*   < > 489* 556* 548* 534* 597*   < > = values in this interval not displayed.   Basic Metabolic Panel: Recent Labs  Lab 11/07/19 0416 11/08/19 0422 11/09/19 0324 11/10/19 0411 11/11/19 0150  NA 130* 130* 131* 131* 131*  K 3.5 3.4* 3.8 4.7 4.7  CL 95* 94* 95* 95* 94*  CO2 _0 32 30  GLUCOSE 310* 180* 95 121* 148*  BUN _1 CREATININE 0.96 0.91 0.89 0.97 1.01  CALCIUM 7.6* 7.4* 7.6* 7.5* 7.9*  MG 1.5* 1.9 2.0 2.0 2.2   GFR: Estimated Creatinine Clearance: 89.3 mL/min (by C-G formula based on SCr of 1.01 mg/dL). Liver Function Tests: Recent Labs  Lab 11/05/19 1850 11/06/19 0450  AST 13* 13*  ALT 13 11  ALKPHOS 105 86  BILITOT 0.9 1.1  PROT 9.1* 8.2*  ALBUMIN 2.7* 2.2*   No results for input(s): LIPASE, AMYLASE in the last 168 hours. No results for input(s): AMMONIA in the last 168 hours. Coagulation Profile: Recent Labs  Lab 11/05/19 1850 11/06/19 0450  INR 1.3* 1.4*   Cardiac Enzymes: Recent Labs  Lab 11/07/19 0416  CKTOTAL 40*   BNP (last 3 results) No results for input(s): PROBNP in the last 8760 hours. HbA1C: No results for input(s): HGBA1C in the last 72 hours. CBG: Recent Labs  Lab 11/10/19 1117 11/10/19 1547 11/10/19 2126 11/11/19 0729 11/11/19 1131  GLUCAP 128* 88 102* 162* 147*   Lipid Profile: No results for input(s): CHOL, HDL, LDLCALC, TRIG, CHOLHDL, LDLDIRECT in the last 72 hours. Thyroid Function  Tests: No results for input(s): TSH, T4TOTAL, FREET4, T3FREE, THYROIDAB in the last 72 hours. Anemia Panel: No results for input(s): VITAMINB12, FOLATE, FERRITIN, TIBC, IRON, RETICCTPCT in the last 72 hours. Sepsis Labs: Recent Labs  Lab 11/05/19 1850 11/06/19 0450  LATICACIDVEN 1.2 1.1    Recent Results (from the past 240 hour(s))  Culture, blood (Routine x 2)     Status: Abnormal   Collection Time: 11/05/19  6:50 PM   Specimen: BLOOD  Result Value Ref Range Status   Specimen Description   Final    BLOOD BLOOD LEFT FOREARM Performed at [facility], [address] [place], [place] [address]    Special Requests   Final    BOTTLES DRAWN AEROBIC AND ANAEROBIC Blood Culture adequate volume Performed at [facility], [address] [place], [place] [address]    Culture  Setup Time   Final    GRAM POSITIVE COCCI IN BOTH AEROBIC AND ANAEROBIC BOTTLES Organism ID to follow CRITICAL RESULT CALLED TO, READ BACK BY AND VERIFIED WITH: KAREN HAYES 11/06/19 0752 KLW Performed at [facility], [address] [place], [place] [address]    Culture STAPHYLOCOCCUS AUREUS (A)  Final   Report Status 11/08/2019 FINAL  Final   Organism ID, Bacteria STAPHYLOCOCCUS AUREUS  Final      Susceptibility   Staphylococcus aureus - MIC*    CIPROFLOXACIN <=0.5 SENSITIVE Sensitive     ERYTHROMYCIN >=8 RESISTANT Resistant     GENTAMICIN <=0.5 SENSITIVE Sensitive     OXACILLIN <=0.25 SENSITIVE Sensitive     TETRACYCLINE <=1 SENSITIVE Sensitive     VANCOMYCIN <=0.5 SENSITIVE Sensitive     TRIMETH/SULFA <=10 SENSITIVE Sensitive     CLINDAMYCIN <=0.25 SENSITIVE Sensitive     RIFAMPIN <=0.5 SENSITIVE Sensitive     Inducible Clindamycin NEGATIVE Sensitive     * STAPHYLOCOCCUS AUREUS  Blood Culture ID Panel (Reflexed)     Status: Abnormal   Collection Time: 11/05/19  6:50 PM  Result Value Ref Range Status   Enterococcus faecalis NOT DETECTED NOT DETECTED Final    Enterococcus Faecium NOT DETECTED NOT DETECTED Final   Listeria monocytogenes NOT DETECTED NOT DETECTED Final   Staphylococcus species DETECTED (A) NOT DETECTED Final    Comment: CRITICAL RESULT CALLED TO, READ BACK BY AND VERIFIED WITH: KAREN HAYES 11/06/19 0752 KLW    Staphylococcus aureus (BCID) DETECTED (A) NOT DETECTED Final    Comment: CRITICAL RESULT CALLED TO, READ BACK BY AND VERIFIED WITH: KAREN HAYES 11/06/19 0752 KLW    Staphylococcus epidermidis NOT DETECTED NOT DETECTED Final   Staphylococcus lugdunensis NOT DETECTED NOT DETECTED Final   Streptococcus species NOT DETECTED NOT DETECTED Final   Streptococcus agalactiae NOT DETECTED NOT DETECTED Final   Streptococcus pneumoniae NOT DETECTED NOT DETECTED Final   Streptococcus pyogenes NOT DETECTED NOT DETECTED Final   A.calcoaceticus-baumannii NOT DETECTED NOT DETECTED Final   Bacteroides fragilis NOT DETECTED NOT DETECTED Final   Enterobacterales NOT DETECTED NOT DETECTED Final   Enterobacter cloacae complex NOT DETECTED NOT DETECTED Final   Escherichia coli NOT DETECTED NOT DETECTED Final   Klebsiella aerogenes NOT DETECTED NOT DETECTED Final   Klebsiella oxytoca NOT DETECTED NOT DETECTED Final   Klebsiella pneumoniae NOT DETECTED NOT DETECTED Final   Proteus species NOT DETECTED NOT DETECTED Final   Salmonella species NOT DETECTED NOT DETECTED Final   Serratia marcescens NOT DETECTED NOT DETECTED Final   Haemophilus influenzae NOT DETECTED NOT DETECTED Final   Neisseria meningitidis NOT DETECTED NOT DETECTED Final   Pseudomonas aeruginosa NOT DETECTED NOT DETECTED Final   Stenotrophomonas maltophilia NOT DETECTED NOT DETECTED Final   Candida albicans NOT DETECTED NOT DETECTED Final   Candida auris NOT DETECTED NOT DETECTED Final   Candida glabrata NOT DETECTED NOT DETECTED Final   Candida krusei NOT DETECTED NOT DETECTED Final   Candida parapsilosis NOT DETECTED NOT DETECTED Final   Candida tropicalis NOT DETECTED NOT  DETECTED Final   Cryptococcus neoformans/gattii  NOT DETECTED NOT DETECTED Final   Meth resistant mecA/C and MREJ NOT DETECTED NOT DETECTED Final    Comment: Performed at [facility], [address] [place], [place] [address]  Culture, blood (Routine x 2)     Status: Abnormal   Collection Time: 11/05/19  8:47 PM   Specimen: BLOOD  Result Value Ref Range Status   Specimen Description   Final    BLOOD LEFT FA Performed at [facility], [address], [place], [place] [address]    Special Requests   Final    BOTTLES DRAWN AEROBIC AND ANAEROBIC Blood Culture adequate volume Performed at [facility], [address] [place], [place] [address]    Culture  Setup Time   Final    GRAM POSITIVE COCCI IN BOTH AEROBIC AND ANAEROBIC BOTTLES CRITICAL VALUE NOTED.  VALUE IS CONSISTENT WITH PREVIOUSLY REPORTED AND CALLED VALUE. Performed at [facility], [address] [place], [place] [address]    Culture (A)  Final    STAPHYLOCOCCUS AUREUS SUSCEPTIBILITIES PERFORMED ON PREVIOUS CULTURE WITHIN THE LAST 5 DAYS. Performed at [facility], [place] [address] [place], [place] [address]    Report Status 11/08/2019 FINAL  Final  Respiratory Panel by RT PCR (Flu A&B, Covid) - Nasopharyngeal Swab     Status: None   Collection Time: 11/05/19  9:27 PM   Specimen: Nasopharyngeal Swab  Result Value Ref Range Status   SARS Coronavirus 2 by RT PCR NEGATIVE NEGATIVE Final    Comment: (NOTE) SARS-CoV-2 target nucleic acids are NOT DETECTED.  The SARS-CoV-2 RNA is generally detectable in upper respiratoy specimens during the acute phase of infection. The lowest concentration of SARS-CoV-2 viral copies this assay can detect is 131 copies/mL. A negative result does not preclude SARS-Cov-2 infection and should not be used as the sole basis for treatment or other patient management decisions. A negative result may occur with  improper specimen collection/handling,  submission of specimen other than nasopharyngeal swab, presence of viral mutation(s) within the areas targeted by this assay, and inadequate number of viral copies (<131 copies/mL). A negative result must be combined with clinical observations, patient history, and epidemiological information. The expected result is Negative.  Fact Sheet for Patients:  PinkCheek.be  Fact Sheet for Healthcare Providers:  GravelBags.it  This test is no t yet approved or cleared by the [place] FDA and  has been authorized for detection and/or diagnosis of SARS-CoV-2 by FDA under an Emergency Use Authorization (EUA). This EUA will remain  in effect (meaning this test can be used) for the duration of the COVID-19 declaration under Section 564(b)(1) of the Act, 21 U.S.C. section 360bbb-3(b)(1), unless the authorization is terminated or revoked sooner.     Influenza A by PCR NEGATIVE NEGATIVE Final   Influenza B by PCR NEGATIVE NEGATIVE Final    Comment: (NOTE) The Xpert Xpress SARS-CoV-2/FLU/RSV assay is intended as an aid in  the diagnosis of influenza from Nasopharyngeal swab specimens and  should not be used as a sole basis for treatment. Nasal washings and  aspirates are unacceptable for Xpert Xpress SARS-CoV-2/FLU/RSV  testing.  Fact Sheet for Patients: PinkCheek.be  Fact Sheet for Healthcare Providers: GravelBags.it  This test is not yet approved or cleared by the [place] FDA and  has been authorized for detection and/or diagnosis of SARS-CoV-2 by  FDA under an Emergency Use Authorization (EUA). This EUA will remain  in effect (meaning this test can be used) for  the duration of the  Covid-19 declaration under Section 564(b)(1) of the Act, 21  U.S.C. section 360bbb-3(b)(1), unless the authorization is  terminated or revoked. Performed at [facility], [place], [place] [address]   C Difficile Quick Screen w PCR reflex     Status: None   Collection Time: 11/06/19  9:48 AM   Specimen: STOOL  Result Value Ref Range Status   C Diff antigen NEGATIVE NEGATIVE Final   C Diff toxin NEGATIVE NEGATIVE Final   C Diff interpretation No C. difficile detected.  Final    Comment: Performed at [facility], [address] [place], [place] [address]  Aerobic/Anaerobic Culture (surgical/deep wound)     Status: None   Collection Time: 11/06/19  5:55 PM   Specimen: Toe  Result Value Ref Range Status   Specimen Description   Final    TOE LEFT GREAT TOE Performed at [facility], [address], [place], [place] [address]    Special Requests   Final    NONE Performed at [facility], [place], [place] [address]    Gram Stain   Final    RARE WBC PRESENT,BOTH PMN AND MONONUCLEAR RARE GRAM POSITIVE COCCI    Culture   Final    FEW STAPHYLOCOCCUS AUREUS RARE STREPTOCOCCUS MITIS/ORALIS RARE KLEBSIELLA PNEUMONIAE NO ANAEROBES ISOLATED Performed at [facility], [place] [address], [place], [place] [address]    Report Status 11/11/2019 FINAL  Final   Organism ID, Bacteria STAPHYLOCOCCUS AUREUS  Final   Organism ID, Bacteria STREPTOCOCCUS MITIS/ORALIS  Final   Organism ID, Bacteria KLEBSIELLA PNEUMONIAE  Final      Susceptibility   Klebsiella pneumoniae - MIC*    AMPICILLIN >=32 RESISTANT Resistant     CEFAZOLIN <=4 SENSITIVE Sensitive     CEFEPIME <=0.12 SENSITIVE Sensitive     CEFTAZIDIME <=1 SENSITIVE Sensitive     CEFTRIAXONE <=0.25 SENSITIVE Sensitive     CIPROFLOXACIN <=0.25 SENSITIVE Sensitive     GENTAMICIN <=1 SENSITIVE Sensitive     IMIPENEM <=0.25 SENSITIVE Sensitive     TRIMETH/SULFA <=20 SENSITIVE Sensitive     AMPICILLIN/SULBACTAM 4 SENSITIVE Sensitive     PIP/TAZO <=4 SENSITIVE Sensitive     * RARE KLEBSIELLA PNEUMONIAE   Staphylococcus aureus - MIC*    CIPROFLOXACIN  <=0.5 SENSITIVE Sensitive     ERYTHROMYCIN 4 INTERMEDIATE Intermediate     GENTAMICIN <=0.5 SENSITIVE Sensitive     OXACILLIN 0.5 SENSITIVE Sensitive     TETRACYCLINE <=1 SENSITIVE Sensitive     VANCOMYCIN <=0.5 SENSITIVE Sensitive     TRIMETH/SULFA <=10 SENSITIVE Sensitive     CLINDAMYCIN <=0.25 SENSITIVE Sensitive     RIFAMPIN <=0.5 SENSITIVE Sensitive     Inducible Clindamycin NEGATIVE Sensitive     * FEW STAPHYLOCOCCUS AUREUS   Streptococcus mitis/oralis - MIC*    TETRACYCLINE <=0.25 SENSITIVE Sensitive     VANCOMYCIN 0.25 SENSITIVE Sensitive     CLINDAMYCIN <=0.25 SENSITIVE Sensitive     PENICILLIN Value in next row Sensitive      SENSITIVE0.06    CEFTRIAXONE Value in next row Sensitive      SENSITIVE0.12    * RARE STREPTOCOCCUS MITIS/ORALIS  Surgical pcr screen     Status: Abnormal   Collection Time: 11/07/19  3:40 PM   Specimen: Nasal Mucosa; Nasal Swab  Result Value Ref Range Status   MRSA, PCR NEGATIVE NEGATIVE Final   Staphylococcus aureus POSITIVE (A) NEGATIVE Final  Comment: (NOTE) The Xpert SA Assay (FDA approved for NASAL specimens in patients 78 years of age and older), is one component of a comprehensive surveillance program. It is not intended to diagnose infection nor to guide or monitor treatment. Performed at [facility], [address] [place], [place] [address]   Culture, blood (Routine X 2) w Reflex to ID Panel     Status: None (Preliminary result)   Collection Time: 11/08/19 12:11 AM   Specimen: BLOOD  Result Value Ref Range Status   Specimen Description BLOOD RIGHT ANTECUBITAL  Final   Special Requests   Final    BOTTLES DRAWN AEROBIC AND ANAEROBIC Blood Culture results may not be optimal due to an excessive volume of blood received in culture bottles   Culture   Final    NO GROWTH 3 DAYS Performed at [facility], [address], [place], [place] [address]    Report Status PENDING  Incomplete  Culture, blood (Routine X 2)  w Reflex to ID Panel     Status: None (Preliminary result)   Collection Time: 11/08/19 12:17 AM   Specimen: BLOOD  Result Value Ref Range Status   Specimen Description BLOOD LEFT ANTECUBITAL  Final   Special Requests   Final    BOTTLES DRAWN AEROBIC AND ANAEROBIC Blood Culture results may not be optimal due to an excessive volume of blood received in culture bottles   Culture   Final    NO GROWTH 3 DAYS Performed at [facility], [address] [place], [place] [address]    Report Status PENDING  Incomplete      Radiology Studies: [place] EKG SITE RITE  Result Date: 11/11/2019 If Site Rite image not attached, placement could not be confirmed due to current cardiac rhythm.    Scheduled Meds: . brimonidine  1 drop Both Eyes BID  . chlorhexidine  60 mL Topical Once  . Chlorhexidine Gluconate Cloth  6 each Topical Daily  . cholecalciferol  1,000 Units Oral Daily  . enoxaparin (LOVENOX) injection  40 mg Subcutaneous Q24H  . insulin aspart  0-15 Units Subcutaneous TID PC,HS,0200  . insulin detemir  10 Units Subcutaneous BID  . iron polysaccharides  150 mg Oral Daily  . povidone-iodine  2 application Topical Once  . sodium chloride flush  10-40 mL Intracatheter Q12H  . timolol  1 drop Both Eyes BID   Continuous Infusions: . sodium chloride 250 mL (11/11/19 1415)  .  ceFAZolin (ANCEF) IV 2 g (11/11/19 1416)  . metronidazole 500 mg (11/11/19 1036)     LOS: 6 days     Enzo Bi, MD [facility] If 7PM-7AM, please contact night-coverage 11/11/2019, 4:44 PM

## 2019-11-11 NOTE — Progress Notes (Signed)
ID Pt has been refusing surgery to the rt foot He says he is feeling okay  Patient Vitals for the past 24 hrs:  BP Temp Temp src Pulse Resp SpO2  11/11/19 0727 138/80 98.3 F (36.8 C) Oral 94 18 97 %  11/11/19 0009 127/72 98.7 F (37.1 C) Oral 89 18 95 %   O/e awake and alert Blind left eye Partial eye sight rt Rt PICC Chest b/l air entry Hs1s2 Abd soft  CBC Latest Ref Rng & Units 11/11/2019 11/10/2019 11/09/2019  WBC 4.0 - 10.5 K/uL 10.3 13.4(H) 12.3(H)  Hemoglobin 13.0 - 17.0 g/dL 1.6(X) 0.9(U) 0.4(V)  Hematocrit 39 - 52 % 30.3(L) 28.4(L) 29.5(L)  Platelets 150 - 400 K/uL 597(H) 534(H) 548(H)    CMP Latest Ref Rng & Units 11/11/2019 11/10/2019 11/09/2019  Glucose 70 - 99 mg/dL 409(W) 119(J) 95  BUN 8 - 23 mg/dL 12 10 10   Creatinine 0.61 - 1.24 mg/dL 4.78 2.95  Sodium 135 - 145 mmol/L 131(L) 131(L) 131(L)  Potassium 3.5 - 5.1 mmol/L 4.7 4.7 3.8  Chloride 98 - 111 mmol/L 94(L) 95(L) 95(L)  CO2 22 - 32 mmol/L 30 32 26  Calcium 8.9 - 10.3 mg/dL 7.9(L) 7.5(L) 7.6(L)  Total Protein 6.5 - 8.1 g/dL - - -  Total Bilirubin 0.3 - 1.2 mg/dL - - -  Alkaline Phos 38 - 126 U/L - - -  AST 15 - 41 U/L - - -  ALT 0 - 44 U/L - - -     Impression/recommendation RTgreat toe infection with osteo MTP Pt refusing amputation Spoke to him in great detail about how the antibiotics dont work on pus, devitalized bone and he needs surgery- He said he will give an answer in a day  Staph aureus bacteremia- on cefazolin repeat blood culture neg Pt cannot do IV at home as 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 Son works 10-7.  Discussed the management with care team     Rt hip, groin pain and numbness-  MRI of the hip and lumbar spine- showed no osteo/ discitis Edema of the rt gluteal minus muscle ? Trauma ? myositis   Anemia with increase in protein- rule out Multiplemyeloma especially with MRI findings of marrow replacement vertebrae  DM- pt was not on any  meds as he says he does not know that he has it. ?  Degenerative disc disease  Discussed the management with care team

## 2019-11-11 NOTE — Care Management Important Message (Signed)
Important Message  Patient Details  Name: Gary Sutton MRN: [id number] Date of Birth: 1960-10-03   Medicare Important Message Given:  Yes     Johnell Comings 11/11/2019, 11:33 AM

## 2019-11-11 NOTE — Progress Notes (Signed)
Inpatient Diabetes Program Recommendations  AACE/ADA: New Consensus Statement on Inpatient Glycemic Control   Target Ranges:  Prepandial:   less than 140 mg/dL      Peak postprandial:   less than 180 mg/dL (1-2 hours)      Critically ill patients:  140 - 180 mg/dL   Results for Gary Sutton, Gary Sutton (MRN [id number]) as of 11/11/2019 10:37  Ref. Range 11/10/2019 07:38 11/10/2019 11:17 11/10/2019 15:47 11/10/2019 21:26 11/11/2019 07:29  Glucose-Capillary Latest Ref Range: 70 - 99 mg/dL 409 (H)     Levemir 10 units 128 (H) 88 102 (H) 162 (H)  Novolog 2 units  Levemir 10  units   Review of Glycemic Control  Diabetes history: DM2 Outpatient Diabetes medications: Metformin 500 mg BID (not taking) Current orders for Inpatient glycemic control: Levemir 10 units BID, Novolog 0-15 units AC&HS  Inpatient Diabetes Program Recommendations:    Insulin: Noted Levemir 10 units was only given once on 11/10/19 (HS dose was NOT GIVEN per [facility]).   NOTE: Patient was seen by J. Lodema Hong, RN Inpatient Diabetes Coordinator on 11/08/19 and patient stated he had been prescribed Metformin but he never filled the prescription. Patient also noted he was not willing to take insulin at home for DM control and he noted concern about diarrhea with Metformin.  Patient likely needs outpatient DM medication to improve control. MD notes to have patient follow up with PCP regarding DM as he is refusing to take insulin outpatient. If patient would be willing to take oral DM medication, could consider discharging on Tradjenta 5 mg daily.  Thanks, Orlando Penner, RN, MSN, CDE Diabetes Coordinator Inpatient Diabetes Program [phone] (Team Pager from 8am to 5pm)

## 2019-11-11 NOTE — Progress Notes (Signed)
Peripherally Inserted Central Catheter Placement  The IV Nurse has discussed with the patient and/or persons authorized to consent for the patient, the purpose of this procedure and the potential benefits and risks involved with this procedure.  The benefits include less needle sticks, lab draws from the catheter, and the patient may be discharged home with the catheter. Risks include, but not limited to, infection, bleeding, blood clot (thrombus formation), and puncture of an artery; nerve damage and irregular heartbeat and possibility to perform a PICC exchange if needed/ordered by physician.  Alternatives to this procedure were also discussed.  Bard Power PICC patient education guide, fact sheet on infection prevention and patient information card has been provided to patient /or left at bedside.    PICC Placement Documentation  PICC Single Lumen 11/11/19 PICC Right Brachial 39 cm 0 cm (Active)  Indication for Insertion or Continuance of Line Prolonged intravenous therapies 11/11/19 1200  Exposed Catheter (cm) 0 cm 11/11/19 1200  Site Assessment Clean;Dry;Intact 11/11/19 1200  Line Status Flushed;Saline locked;Blood return noted 11/11/19 1200  Dressing Type Transparent;Securing device 11/11/19 1200  Dressing Status Clean;Dry;Intact 11/11/19 1200  Antimicrobial disc in place? Yes 11/11/19 1200  Line Care Connections checked and tightened 11/11/19 1200  Dressing Intervention New dressing 11/11/19 1200  Dressing Change Due 11/18/19 11/11/19 1200       Franne Grip Renee 11/11/2019, 12:41 PM

## 2019-11-11 NOTE — Evaluation (Signed)
Physical Therapy Evaluation Patient Details Name: Gary Sutton MRN: [id number] DOB: Mar 21, 1960 Today's Date: 11/11/2019   History of Present Illness  Gary Sutton is a 59 year old male with PMH of type 2 diabetes mellitus, glaucoma, and degenerative disc disease. He presented to the ED with acute onset of RLE pain involving his leg and associated right foot swelling with erythema at the big toe. He is admitted for toe osteomyelitis and is pending surgery. Per MD conversation with Dr. Fran Lowes and Dr. Ether Griffins, pt is NWB to his R foot and could use the heel for transfer if needed.    Clinical Impression  Pt presents supine in bed on arrival to room and is agreeable to PT evaluation. He is Max+1 for supine to sit as he needed assistance with trunk and LEs to sit EOB. Tactile cues utilized for proper hand placement throughout. For sit to stand transfer, pt attempted transfer from lowest bed setting but pt was unsuccessful. After raising bed, pt successfully performs transfer with modA+1. VCs utilized throughout for proper technique of transfer and weight bearing precautions. Pt performed 2 hops on LLE and BUE support on RW and stand for 5 minutes as nursing changes bed linens. He is limited due to fatigue and pain. Pt needed constant reminders throughout session to not put any weight on R toes and keep just his RLE heel on the ground. Returning to bed, pt is modA+1 as he only needed help with LEs. Doctors notified on pt's decision to have the surgery. Pt currently presents with deficits in strength, mobility, functional activity tolerance, and balance. Pt would benefit from skilled PT during acute stay to address aforementioned deficits and STR at discharge to optimize return to PLOF and maximize functional mobility.      Follow Up Recommendations SNF    Equipment Recommendations  Rolling walker with 5" wheels;3in1 (PT)    Recommendations for Other Services       Precautions / Restrictions  Precautions Precaution Comments: Patient has glaucoma and is unable to see clearly Restrictions Weight Bearing Restrictions: Yes RLE Weight Bearing: Non weight bearing      Mobility  Bed Mobility Overal bed mobility: Needs Assistance Bed Mobility: Supine to Sit;Sit to Supine     Supine to sit: HOB elevated;Max assist Sit to supine: Mod assist   General bed mobility comments: Tactile cues utilized for proper hand placement. Pt needed assistance with trunk and LEs to sit EOB. Returning to bed, pt only needed help with LEs  Transfers Overall transfer level: Needs assistance Equipment used: Rolling walker (2 wheeled) Transfers: Sit to/from Stand Sit to Stand: From elevated surface;Mod assist         General transfer comment: Attempted transfer from lowest bed setting but pt was unsuccessful. Pt able to perform sit<>stand transfer from elevated surface with modA+1. VCs utilized throughout for proper technique of transfer and weight bearing precautions.  Ambulation/Gait Ambulation/Gait assistance: Mod assist Gait Distance (Feet): 2 Feet Assistive device: Rolling walker (2 wheeled) Gait Pattern/deviations: Step-to pattern;Decreased step length - right;Decreased step length - left     General Gait Details: Pt demonstrated 2 hops on LLE and BUE support on RW. Pt needed constant reminders to not put any weight on R toes.   Stairs            Wheelchair Mobility    Modified Rankin (Stroke Patients Only)       Balance Overall balance assessment: Needs assistance Sitting-balance support: Feet supported Sitting balance-Leahy Scale: Good  Sitting balance - Comments: Pt demonstrates good sitting balance with feet supported and no LOB within BOS   Standing balance support: Bilateral upper extremity supported Standing balance-Leahy Scale: Fair Standing balance comment: Pt able to stand for 5 minutes with BUE support on RW as nursing changes bed linens. He needed constant  reminders to keep just his RLE heel on the ground.                              Pertinent Vitals/Pain Pain Assessment: 0-10 Pain Score: 5  (5-6/10 not moving, 8/10 with movement) Pain Location: R upper thigh Pain Descriptors / Indicators: Aching;Sore Pain Intervention(s): Monitored during session    Home Living Family/patient expects to be discharged to:: Private residence Living Arrangements: Children Available Help at Discharge: Family;Available PRN/intermittently (Son works during the day) Type of Home: Apartment Home Access: Level entry     Home Layout: One level Home Equipment: None      Prior Function Level of Independence: Independent         Comments: Pt reports 0 falls in last 6 months. He is independent with I/ADLs (cooking, cleaning, bathing)     Hand Dominance   Dominant Hand: Right    Extremity/Trunk Assessment   Upper Extremity Assessment Upper Extremity Assessment: Generalized weakness (Generally 4+/5 UE strength)    Lower Extremity Assessment Lower Extremity Assessment: RLE deficits/detail;LLE deficits/detail RLE Deficits / Details: Pt has pain when moving RLE, however able to achieve full ROM RLE: Unable to fully assess due to pain RLE Sensation: WNL LLE Deficits / Details: Generally 4 to 4+/5 strength LLE Sensation: WNL       Communication   Communication: No difficulties  Cognition Arousal/Alertness: Awake/alert Behavior During Therapy: WFL for tasks assessed/performed Overall Cognitive Status: Within Functional Limits for tasks assessed                                 General Comments: Patient is A&Ox4      General Comments      Exercises General Exercises - Lower Extremity Ankle Circles/Pumps: AROM;Right;5 reps;Seated Long Arc Quad: AROM;5 reps;Seated;Both Hip Flexion/Marching: AROM;Both;5 reps;Seated Other Exercises Other Exercises: Pt performed 4 tricep dips scoots to be positioned up higher in bed    Assessment/Plan    PT Assessment Patient needs continued PT services  PT Problem List Decreased strength;Decreased range of motion;Decreased activity tolerance;Decreased balance;Decreased mobility;Decreased coordination;Decreased knowledge of precautions;Pain;Decreased skin integrity       PT Treatment Interventions DME instruction;Gait training;Functional mobility training;Therapeutic activities;Therapeutic exercise;Balance training;Neuromuscular re-education    PT Goals (Current goals can be found in the Care Plan section)  Acute Rehab PT Goals Patient Stated Goal: "to go home" PT Goal Formulation: With patient Time For Goal Achievement: 11/25/19 Potential to Achieve Goals: Fair Additional Goals Additional Goal #1: Pt will be modified independent with bed mobility and transfers in order to return to PLOF with minimal difficulty.    Frequency Min 2X/week   Barriers to discharge Decreased caregiver support Lives with son but he has to work and not around 24/7    Co-evaluation               AM-PAC PT "6 Clicks" Mobility  Outcome Measure Help needed turning from your back to your side while in a flat bed without using bedrails?: A Little Help needed moving from lying on your back to sitting  on the side of a flat bed without using bedrails?: A Little Help needed moving to and from a bed to a chair (including a wheelchair)?: A Lot Help needed standing up from a chair using your arms (e.g., wheelchair or bedside chair)?: A Lot Help needed to walk in hospital room?: A Lot Help needed climbing 3-5 steps with a railing? : A Lot 6 Click Score: 14    End of Session Equipment Utilized During Treatment: Gait belt Activity Tolerance: Patient limited by pain;Patient tolerated treatment well;Patient limited by fatigue Patient left: in bed;with call bell/phone within reach;with bed alarm set Nurse Communication: Mobility status PT Visit Diagnosis: Unsteadiness on feet  (R26.81);Other abnormalities of gait and mobility (R26.89);Muscle weakness (generalized) (M62.81);Difficulty in walking, not elsewhere classified (R26.2);Pain Pain - Right/Left: Right Pain - part of body: Leg    Time: 1330-1408 PT Time Calculation (min) (ACUTE ONLY): 38 min   Charges:   PT Evaluation $PT Eval Moderate Complexity: 1 Mod PT Treatments $Therapeutic Exercise: 23-37 mins        Katherine Basset, SPT Baker Pierini 11/11/2019, 4:51 PM

## 2019-11-11 NOTE — TOC Initial Note (Signed)
Transition of Care [facility]) - Initial/Assessment Note    Patient Details  Name: Gary Sutton MRN: [id number] Date of Birth: April 21, 1960  Transition of [facility]) CM/SW Contact:    Liliana Cline, LCSW Phone Number: 11/11/2019, 10:47 AM  Clinical Narrative:                Received TOC Consult for patient needing a PCP and diabetic teaching. Asked MD to put in consult for Diabetes Coordinator for teaching. Per Diabetes Coordinator and MD, patient refuses all diabetic interventions so consult is cancelled. CSW spoke with patient about PCP. Patient says he has been calling "all around town" for a PCP and is trying to find "the right one." Patient says he has already been provided a booklet with PCP options and plans to call and make himself a PCP appointment when discharged.          Patient Goals and CMS Choice        Expected Discharge Plan and Services                                                Prior Living Arrangements/Services                       Activities of Daily Living      Permission Sought/Granted                  Emotional Assessment              Admission diagnosis:  Toe osteomyelitis (HCC) [M86.9] Acute osteomyelitis (HCC) [M86.10] Osteomyelitis, unspecified site, unspecified type [facility]) [M86.9] Patient Active Problem List   Diagnosis Date Noted  . Toe osteomyelitis (HCC) 11/05/2019   PCP:  Patient, No Pcp Per Pharmacy:   [id number] [place], [place] - [address] [place] [address] Phone: [phone] Fax: [phone]  [facility] [address]), [place] - [address] [place]) [place] [address] Phone: [phone] Fax: [phone]     Social Determinants of Health (SDOH) Interventions    Readmission Risk Interventions No flowsheet data found.

## 2019-11-12 ENCOUNTER — Inpatient Hospital Stay: Payer: Medicare Other | Admitting: Anesthesiology

## 2019-11-12 DIAGNOSIS — M869 Osteomyelitis, unspecified: Secondary | ICD-10-CM | POA: Diagnosis not present

## 2019-11-12 DIAGNOSIS — B9561 Methicillin susceptible Staphylococcus aureus infection as the cause of diseases classified elsewhere: Secondary | ICD-10-CM | POA: Diagnosis not present

## 2019-11-12 DIAGNOSIS — R7881 Bacteremia: Secondary | ICD-10-CM | POA: Diagnosis not present

## 2019-11-12 DIAGNOSIS — D649 Anemia, unspecified: Secondary | ICD-10-CM | POA: Diagnosis not present

## 2019-11-12 LAB — GLUCOSE, CAPILLARY
Glucose-Capillary: 120 mg/dL — ABNORMAL HIGH (ref 70–99)
Glucose-Capillary: 200 mg/dL — ABNORMAL HIGH (ref 70–99)
Glucose-Capillary: 232 mg/dL — ABNORMAL HIGH (ref 70–99)
Glucose-Capillary: 135 mg/dL — ABNORMAL HIGH (ref 70–99)
Glucose-Capillary: 176 mg/dL — ABNORMAL HIGH (ref 70–99)

## 2019-11-12 LAB — MAGNESIUM: Magnesium: 2 mg/dL (ref 1.7–2.4)

## 2019-11-12 LAB — BASIC METABOLIC PANEL
Anion gap: 10 (ref 5–15)
BUN: 11 mg/dL (ref 8–23)
CO2: 27 mmol/L (ref 22–32)
Calcium: 8.3 mg/dL — ABNORMAL LOW (ref 8.9–10.3)
Chloride: 93 mmol/L — ABNORMAL LOW (ref 98–111)
Creatinine, Ser: 1 mg/dL (ref 0.61–1.24)
GFR, Estimated: >60 mL/min (ref >60)
Glucose, Bld: 145 mg/dL — ABNORMAL HIGH (ref 70–99)
Potassium: 4.5 mmol/L (ref 3.5–5.1)
Sodium: 130 mmol/L — ABNORMAL LOW (ref 135–145)

## 2019-11-12 LAB — PROTEIN ELECTROPHORESIS, SERUM
A/G Ratio: 0.4 — ABNORMAL LOW (ref 0.7–1.7)
Albumin ELP: 1.7 g/dL — ABNORMAL LOW (ref 2.9–4.4)
Alpha-1-Globulin: 0.4 g/dL (ref 0.0–0.4)
Alpha-2-Globulin: 1.1 g/dL — ABNORMAL HIGH (ref 0.4–1.0)
Beta Globulin: 1 g/dL (ref 0.7–1.3)
Gamma Globulin: 2.2 g/dL — ABNORMAL HIGH (ref 0.4–1.8)
Globulin, Total: 4.7 g/dL — ABNORMAL HIGH (ref 2.2–3.9)
Total Protein ELP: 6.4 g/dL (ref 6.0–8.5)

## 2019-11-12 LAB — CBC
HCT: 31.2 % — ABNORMAL LOW (ref 39.0–52.0)
Hemoglobin: 9.8 g/dL — ABNORMAL LOW (ref 13.0–17.0)
MCH: 23.3 pg — ABNORMAL LOW (ref 26.0–34.0)
MCHC: 31.4 g/dL (ref 30.0–36.0)
MCV: 74.3 fL — ABNORMAL LOW (ref 80.0–100.0)
Platelets: 641 10*3/uL — ABNORMAL HIGH (ref 150–400)
RBC: 4.2 MIL/uL — ABNORMAL LOW (ref 4.22–5.81)
RDW: 13.9 % (ref 11.5–15.5)
WBC: 10.3 10*3/uL (ref 4.0–10.5)
nRBC: 0 % (ref 0.0–0.2)

## 2019-11-12 MED ORDER — CHLORHEXIDINE GLUCONATE 4 % EX LIQD
60.0000 mL | Freq: Once | CUTANEOUS | Status: AC
  Administered 2019-11-13: 4 via TOPICAL

## 2019-11-12 MED ORDER — POVIDONE-IODINE 10 % EX SWAB
2.0000 "application " | Freq: Once | CUTANEOUS | Status: AC
  Administered 2019-11-13: 2 via TOPICAL

## 2019-11-12 MED ORDER — VITAMIN B-12 1000 MCG PO TABS
1000.0000 ug | ORAL_TABLET | Freq: Every day | ORAL | Status: DC
  Administered 2019-11-14 – 2019-11-20 (×7): 1000 ug via ORAL
  Filled 2019-11-12 (×7): qty 1

## 2019-11-12 NOTE — Anesthesia Preprocedure Evaluation (Addendum)
Anesthesia Evaluation  Patient identified by MRN, date of birth, ID band Patient awake    Reviewed: Allergy & Precautions, NPO status , Patient's Chart, lab work & pertinent test results  Airway Mallampati: III       Dental   Pulmonary neg pulmonary ROS,    Pulmonary exam normal        Cardiovascular negative cardio ROS Normal cardiovascular exam     Neuro/Psych negative neurological ROS  negative psych ROS   GI/Hepatic negative GI ROS, Neg liver ROS,   Endo/Other  diabetes  Renal/GU negative Renal ROS  negative genitourinary   Musculoskeletal negative musculoskeletal ROS (+)   Abdominal Normal abdominal exam  (+)   Peds negative pediatric ROS (+)  Hematology negative hematology ROS (+)   Anesthesia Other Findings Past Medical History: No date: Diabetes mellitus without complication (HCC) No date: Glaucoma No date: H/O degenerative disc disease   Reproductive/Obstetrics                            Anesthesia Physical Anesthesia Plan  ASA: II  Anesthesia Plan: General   Post-op Pain Management:    Induction: Intravenous  PONV Risk Score and Plan:   Airway Management Planned: LMA  Additional Equipment:   Intra-op Plan:   Post-operative Plan:   Informed Consent: I have reviewed the patients History and Physical, chart, labs and discussed the procedure including the risks, benefits and alternatives for the proposed anesthesia with the patient or authorized representative who has indicated his/her understanding and acceptance.     Dental advisory given  Plan Discussed with:   Anesthesia Plan Comments:         Anesthesia Quick Evaluation

## 2019-11-12 NOTE — Progress Notes (Signed)
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 Chest b/l air entry [id number] abd soft CNS non focal Rt foot - great toe wound  Labs CBC Latest Ref Rng & Units 11/12/2019 11/11/2019 11/10/2019  WBC 4.0 - 10.5 K/uL 10.3 10.3 13.4(H)  Hemoglobin 13.0 - 17.0 g/dL 1.6(X) 0.9(U) 0.4(V)  Hematocrit 39 - 52 % 31.2(L) 30.3(L) 28.4(L)  Platelets 150 - 400 K/uL 641(H) 597(H) 534(H)    CMP Latest Ref Rng & Units 11/12/2019 11/11/2019 11/10/2019  Glucose 70 - 99 mg/dL 409(W) 119(J) 478(G)  BUN 8 - 23 mg/dL 11 12 10   Creatinine 0.61 - 1.24 mg/dL 9.56 2.13  Sodium 135 - 145 mmol/L 130(L) 131(L) 131(L)  Potassium 3.5 - 5.1 mmol/L 4.5 4.7 4.7  Chloride 98 - 111 mmol/L 93(L) 94(L) 95(L)  CO2 22 - 32 mmol/L 27 30 32  Calcium 8.9 - 10.3 mg/dL 8.3(L) 7.9(L) 7.5(L)  Total Protein 6.5 - 8.1 g/dL - - -  Total Bilirubin 0.3 - 1.2 mg/dL - - -  Alkaline Phos 38 - 126 U/L - - -  AST 15 - 41 U/L - - -  ALT 0 - 44 U/L - - -    Micro 11/05/19 4/4 MSSA 11/08/19 BC Ng 10/13 - wound culture- MSSA, kleb, strep mitis  Impression/recommendation  RTgreat toe infection with osteo MTP Agreed for amputation of the toe  Staph aureus bacteremia-on cefazolin- secondary to the rt fott infection repeat blood culture neg 2 d echo okay Pt cannot do IV at home as 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 Son works 10-7.   Rt hip, groin pain and numbness- MRI of the hip and lumbar spine- showed no osteo/ discitis Edema of the rt gluteal minus muscle ? Trauma ? Myositis improving   Anemia with increase in protein-SPEP N  DM-?  Degenerative disc  disease  Discussed the management with patient and care team

## 2019-11-12 NOTE — Progress Notes (Signed)
Daily Progress Note   Subjective  - 4 Days Post-Op  F/u right foot infection.  Pt would like to proceed with surgery.  Objective Vitals:   11/11/19 0009 11/11/19 0727 11/11/19 2338 11/12/19 0804  BP: 127/72 138/80 125/74 140/81  Pulse: 89 94 89 94  Resp: 18 18 18 16   Temp: 98.7 F (37.1 C) 98.3 F (36.8 C) 98.4 F (36.9 C) 98 F (36.7 C)  TempSrc: Oral Oral Oral Oral  SpO2: 95% 97% 95% 96%  Weight:      Height:        Physical Exam: Dressing with purulent drainage to great toe joint.  Laboratory CBC    Component Value Date/Time   WBC 10.3 11/12/2019 0305   HGB 9.8 (L) 11/12/2019 0305   HCT 31.2 (L) 11/12/2019 0305   PLT 641 (H) 11/12/2019 0305    BMET    Component Value Date/Time   NA 130 (L) 11/12/2019 0305   K 4.5 11/12/2019 0305   CL 93 (L) 11/12/2019 0305   CO2 27 11/12/2019 0305   GLUCOSE 145 (H) 11/12/2019 0305   BUN 11 11/12/2019 0305   CREATININE 1.00 11/12/2019 0305   CALCIUM 8.3 (L) 11/12/2019 0305   GFRNONAA >60 11/12/2019 0305   GFRAA >60 01/19/2019 0139    Assessment/Planning: Osteomyelitis right great toe joint. DM with neuropathy.    Will plan for 1st ray amputation.  D/w pt today.  R/B/A/C and consent given verbally.  Plan for tomorrow pm  Orders placed.  01/21/2019 A  11/12/2019, 1:37 PM

## 2019-11-12 NOTE — Progress Notes (Signed)
PROGRESS NOTE    DAMAURI Sutton  XID:[id number] DOB: 04-01-1960 DOA: 11/05/2019 PCP: Patient, No Pcp Per    Assessment & Plan:   Active Problems:   Toe osteomyelitis (HCC)   Gary Sutton  is a 59 y.o. male with a known history of type 2 diabetes mellitus, glaucoma and degenerative disc disease, who presented to the emergency room with acute onset of right lower extremity pain involving his leg with associated right foot swelling with erythema at the big toe.  The patient stated that he had pain around his right hip flexors and has not had any trauma.  He admitted to chills but did not check his temperature.  His right big toe has been swelling over the last week.  No chest pain or dyspnea or cough or wheezing.  No dysuria, oliguria or hematuria or flank pain.   # Sepsis 2/2  # Right big toe diabetic ulcer and osteomyelitis involving the first MTP # Staph bacteremia --fever and tachycardia as well as leukocytosis on presentation. --started on cefepime and vancomycin and switched to cefazolin, and then Flagyl added, per ID. --Despite repeated urging from podiatry, ID and I, pt had been refusing surgical I/D of his right foot osteomyelitis, but finally agreed to it on 10/18.  PLAN: --cont cefazolin and Flagyl. 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  MRI lumbar spine, right hip showed the above findings. --CK not elevated. PLAN: --supportive care for now --Flexeril PRN and tramadol PRN  2.  Type 2 diabetes mellitus, poorly controlled -on home metformin, but reportedly didn't take. --A1c 12.6 --Diabetic educator involved.  Pt is refusing to start insulin after discharge. PLAN: --cont Levemir 10u BID while inpatient --SSI --cont current regimen --No need to order glucometer  for home use because pt refuses insulin for home use.  Will have pt follow up as outpatient.  3.  Glaucoma. -continue his Travatan and Timoptic ophthalmic gtts   # Anemia, microcytic, def in iron and vit B12 --anemia workup showed low vit B12 and low iron --Vit B12 injection x1  PLAN: --cont oral vit B12 suppl --cont oral iron suppl  # Suspect multiple myeloma --increased in urine protein, MRI spine showing possible marrow replacement processes. --SPEP neg for MM.  "pattern appears to be compatible with a chronic inflammatory response."   DVT prophylaxis: Lovenox SQ Code Status: Full code  Family Communication:  Status is: inpatient Dispo:   The patient is from: home Anticipated d/c is to: SNF rehab Anticipated d/c date is: >3 days Patient currently is not medically stable to d/c due to: bacteremia, osteomyelitis, need surgical resection, has been refusing surgery until 10/18.     Subjective and Interval History:  Pt reported hip and thigh pain improved with Flexeril.  No other complaints.   Objective: Vitals:   11/11/19 0727 11/11/19 2338 11/12/19 0804 11/12/19 1500  BP: 138/80 125/74 140/81 139/76  Pulse: 94 89 94 96  Resp: '18 18 16 18  ' Temp: 98.3 F (36.8 C) 98.4 F (36.9 C) 98 F (36.7 C) 98.6 F (37 C)  TempSrc: Oral Oral Oral Oral  SpO2: 97% 95% 96% 99%  Weight:      Height:        Intake/Output Summary (Last 24 hours) at 11/12/2019 1721 Last data filed at 11/12/2019  1646 Gross per 24 hour  Intake 120 ml  Output 2675 ml  Net -2555 ml   Filed Weights   11/05/19 1841  Weight: 86.2 kg    Examination:   Constitutional: NAD, AAOx3 HEENT: visually impaired CV: No cyanosis.   RESP: normal respiratory effort, on RA Extremities: right foot wrapped, swelling improved in BLE SKIN: warm, dry and peeling skin over BLE Neuro: II - XII grossly intact.     Data Reviewed: I have personally reviewed following labs and imaging studies  CBC: Recent Labs   Lab 11/05/19 1850 11/05/19 1850 11/06/19 0450 11/07/19 0416 11/08/19 0422 11/09/19 0324 11/10/19 0411 11/11/19 0150 11/12/19 0305  WBC 13.3*   < > 15.2*   < > 14.0* 12.3* 13.4* 10.3 10.3  NEUTROABS 11.5*  --  12.5*  --   --   --   --   --   --   HGB 10.7*   < > 10.0*   < > 9.9* 9.3* 9.2* 9.5* 9.8*  HCT 33.3*   < > 30.9*   < > 30.1* 29.5* 28.4* 30.3* 31.2*  MCV 74.5*   < > 74.1*   < > 73.4* 73.8* 74.3* 75.0* 74.3*  PLT 621*   < > 552*   < > 556* 548* 534* 597* 641*   < > = values in this interval not displayed.   Basic Metabolic Panel: Recent Labs  Lab 11/08/19 0422 11/09/19 0324 11/10/19 0411 11/11/19 0150 11/12/19 0305  NA 130* 131* 131* 131* 130*  K 3.4* 3.8 4.7 4.7 4.5  CL 94* 95* 95* 94* 93*  CO2 25 26 32 30 27  GLUCOSE 180* 95 121* 148* 145*  BUN '12 10 10 12 11  ' CREATININE 0.91 0.89 0.97 1.01 1.00  CALCIUM 7.4* 7.6* 7.5* 7.9* 8.3*  MG 1.9 2.0 2.0 2.2 2.0   GFR: Estimated Creatinine Clearance: 90.2 mL/min (by C-G formula based on SCr of 1 mg/dL). Liver Function Tests: Recent Labs  Lab 11/05/19 1850 11/06/19 0450  AST 13* 13*  ALT 13 11  ALKPHOS 105 86  BILITOT 0.9 1.1  PROT 9.1* 8.2*  ALBUMIN 2.7* 2.2*   No results for input(s): LIPASE, AMYLASE in the last 168 hours. No results for input(s): AMMONIA in the last 168 hours. Coagulation Profile: Recent Labs  Lab 11/05/19 1850 11/06/19 0450  INR 1.3* 1.4*   Cardiac Enzymes: Recent Labs  Lab 11/07/19 0416  CKTOTAL 40*   BNP (last 3 results) No results for input(s): PROBNP in the last 8760 hours. HbA1C: No results for input(s): HGBA1C in the last 72 hours. CBG: Recent Labs  Lab 11/11/19 2146 11/12/19 0205 11/12/19 0805 11/12/19 1155 11/12/19 1641  GLUCAP 128* 120* 200* 232* 135*   Lipid Profile: No results for input(s): CHOL, HDL, LDLCALC, TRIG, CHOLHDL, LDLDIRECT in the last 72 hours. Thyroid Function Tests: No results for input(s): TSH, T4TOTAL, FREET4, T3FREE, THYROIDAB in the last  72 hours. Anemia Panel: No results for input(s): VITAMINB12, FOLATE, FERRITIN, TIBC, IRON, RETICCTPCT in the last 72 hours. Sepsis Labs: Recent Labs  Lab 11/05/19 1850 11/06/19 0450  LATICACIDVEN 1.2 1.1    Recent Results (from the past 240 hour(s))  Culture, blood (Routine x 2)     Status: Abnormal   Collection Time: 11/05/19  6:50 PM   Specimen: BLOOD  Result Value Ref Range Status   Specimen Description   Final    BLOOD BLOOD LEFT FOREARM Performed at [facility], [address] [place],  [place] [address]    Special Requests   Final    BOTTLES DRAWN AEROBIC AND ANAEROBIC Blood Culture adequate volume Performed at [facility], [address] [place], [place] [address]    Culture  Setup Time   Final    GRAM POSITIVE COCCI IN BOTH AEROBIC AND ANAEROBIC BOTTLES Organism ID to follow CRITICAL RESULT CALLED TO, READ BACK BY AND VERIFIED WITH: KAREN HAYES 11/06/19 0752 KLW Performed at [facility], [address] [place], [place] [address]    Culture STAPHYLOCOCCUS AUREUS (A)  Final   Report Status 11/08/2019 FINAL  Final   Organism ID, Bacteria STAPHYLOCOCCUS AUREUS  Final      Susceptibility   Staphylococcus aureus - MIC*    CIPROFLOXACIN <=0.5 SENSITIVE Sensitive     ERYTHROMYCIN >=8 RESISTANT Resistant     GENTAMICIN <=0.5 SENSITIVE Sensitive     OXACILLIN <=0.25 SENSITIVE Sensitive     TETRACYCLINE <=1 SENSITIVE Sensitive     VANCOMYCIN <=0.5 SENSITIVE Sensitive     TRIMETH/SULFA <=10 SENSITIVE Sensitive     CLINDAMYCIN <=0.25 SENSITIVE Sensitive     RIFAMPIN <=0.5 SENSITIVE Sensitive     Inducible Clindamycin NEGATIVE Sensitive     * STAPHYLOCOCCUS AUREUS  Blood Culture ID Panel (Reflexed)     Status: Abnormal   Collection Time: 11/05/19  6:50 PM  Result Value Ref Range Status   Enterococcus faecalis NOT DETECTED NOT DETECTED Final   Enterococcus Faecium NOT DETECTED NOT DETECTED Final   Listeria monocytogenes NOT DETECTED NOT  DETECTED Final   Staphylococcus species DETECTED (A) NOT DETECTED Final    Comment: CRITICAL RESULT CALLED TO, READ BACK BY AND VERIFIED WITH: KAREN HAYES 11/06/19 0752 KLW    Staphylococcus aureus (BCID) DETECTED (A) NOT DETECTED Final    Comment: CRITICAL RESULT CALLED TO, READ BACK BY AND VERIFIED WITH: KAREN HAYES 11/06/19 0752 KLW    Staphylococcus epidermidis NOT DETECTED NOT DETECTED Final   Staphylococcus lugdunensis NOT DETECTED NOT DETECTED Final   Streptococcus species NOT DETECTED NOT DETECTED Final   Streptococcus agalactiae NOT DETECTED NOT DETECTED Final   Streptococcus pneumoniae NOT DETECTED NOT DETECTED Final   Streptococcus pyogenes NOT DETECTED NOT DETECTED Final   A.calcoaceticus-baumannii NOT DETECTED NOT DETECTED Final   Bacteroides fragilis NOT DETECTED NOT DETECTED Final   Enterobacterales NOT DETECTED NOT DETECTED Final   Enterobacter cloacae complex NOT DETECTED NOT DETECTED Final   Escherichia coli NOT DETECTED NOT DETECTED Final   Klebsiella aerogenes NOT DETECTED NOT DETECTED Final   Klebsiella oxytoca NOT DETECTED NOT DETECTED Final   Klebsiella pneumoniae NOT DETECTED NOT DETECTED Final   Proteus species NOT DETECTED NOT DETECTED Final   Salmonella species NOT DETECTED NOT DETECTED Final   Serratia marcescens NOT DETECTED NOT DETECTED Final   Haemophilus influenzae NOT DETECTED NOT DETECTED Final   Neisseria meningitidis NOT DETECTED NOT DETECTED Final   Pseudomonas aeruginosa NOT DETECTED NOT DETECTED Final   Stenotrophomonas maltophilia NOT DETECTED NOT DETECTED Final   Candida albicans NOT DETECTED NOT DETECTED Final   Candida auris NOT DETECTED NOT DETECTED Final   Candida glabrata NOT DETECTED NOT DETECTED Final   Candida krusei NOT DETECTED NOT DETECTED Final   Candida parapsilosis NOT DETECTED NOT DETECTED Final   Candida tropicalis NOT DETECTED NOT DETECTED Final   Cryptococcus neoformans/gattii NOT DETECTED NOT DETECTED Final   Meth  resistant mecA/C and MREJ NOT DETECTED NOT DETECTED Final    Comment: Performed at [facility], [address] [place], [place] [address]  Culture, blood (Routine x 2)     Status: Abnormal   Collection Time: 11/05/19  8:47 PM   Specimen: BLOOD  Result Value Ref Range Status   Specimen Description   Final    BLOOD LEFT FA Performed at [facility], [address] [place], [place] [address]    Special Requests   Final    BOTTLES DRAWN AEROBIC AND ANAEROBIC Blood Culture adequate volume Performed at [facility], [address] [place], [place] [address]    Culture  Setup Time   Final    GRAM POSITIVE COCCI IN BOTH AEROBIC AND ANAEROBIC BOTTLES CRITICAL VALUE NOTED.  VALUE IS CONSISTENT WITH PREVIOUSLY REPORTED AND CALLED VALUE. Performed at [facility], [address] [place],  [address]    Culture (A)  Final    STAPHYLOCOCCUS AUREUS SUSCEPTIBILITIES PERFORMED ON PREVIOUS CULTURE WITHIN THE LAST 5 DAYS. Performed at [facility], [place] [address] [place],  [address]    Report Status 11/08/2019 FINAL  Final  Respiratory Panel by RT PCR (Flu A&B, Covid) - Nasopharyngeal Swab     Status: None   Collection Time: 11/05/19  9:27 PM   Specimen: Nasopharyngeal Swab  Result Value Ref Range Status   SARS Coronavirus 2 by RT PCR NEGATIVE NEGATIVE Final    Comment: (NOTE) SARS-CoV-2 target nucleic acids are NOT DETECTED.  The SARS-CoV-2 RNA is generally detectable in upper respiratoy specimens during the acute phase of infection. The lowest concentration of SARS-CoV-2 viral copies this assay can detect is 131 copies/mL. A negative result does not preclude SARS-Cov-2 infection and should not be used as the sole basis for treatment or other patient management decisions. A negative result may occur with  improper specimen collection/handling, submission of specimen other than nasopharyngeal swab, presence of viral mutation(s)  within the areas targeted by this assay, and inadequate number of viral copies (<131 copies/mL). A negative result must be combined with clinical observations, patient history, and epidemiological information. The expected result is Negative.  Fact Sheet for Patients:  PinkCheek.be  Fact Sheet for Healthcare Providers:  GravelBags.it  This test is no t yet approved or cleared by the [place] FDA and  has been authorized for detection and/or diagnosis of SARS-CoV-2 by FDA under an Emergency Use Authorization (EUA). This EUA will remain  in effect (meaning this test can be used) for the duration of the COVID-19 declaration under Section 564(b)(1) of the Act, 21 U.S.C. section 360bbb-3(b)(1), unless the authorization is terminated or revoked sooner.     Influenza A by PCR NEGATIVE NEGATIVE Final   Influenza B by PCR NEGATIVE NEGATIVE Final    Comment: (NOTE) The Xpert Xpress SARS-CoV-2/FLU/RSV assay is intended as an aid in  the diagnosis of influenza from Nasopharyngeal swab specimens and  should not be used as a sole basis for treatment. Nasal washings and  aspirates are unacceptable for Xpert Xpress SARS-CoV-2/FLU/RSV  testing.  Fact Sheet for Patients: PinkCheek.be  Fact Sheet for Healthcare Providers: GravelBags.it  This test is not yet approved or cleared by the [place] FDA and  has been authorized for detection and/or diagnosis of SARS-CoV-2 by  FDA under an Emergency Use Authorization (EUA). This EUA will remain  in effect (meaning this test can be used) for the duration of the  Covid-19 declaration under Section 564(b)(1) of the Act, 21  U.S.C. section 360bbb-3(b)(1), unless the authorization is  terminated or revoked. Performed at [facility], [place], [place] [address]   C Difficile Quick Screen w PCR reflex      Status: None   Collection Time: 11/06/19  9:48 AM   Specimen: STOOL  Result Value Ref Range Status   C Diff antigen NEGATIVE NEGATIVE Final   C Diff toxin NEGATIVE NEGATIVE Final   C Diff interpretation No C. difficile detected.  Final    Comment: Performed at [facility], [address] [place], [place] [address]  Aerobic/Anaerobic Culture (surgical/deep wound)     Status: None   Collection Time: 11/06/19  5:55 PM   Specimen: Toe  Result Value Ref Range Status   Specimen Description   Final    TOE LEFT GREAT TOE Performed at [facility], [address] [place], [place] [address]    Special Requests   Final    NONE Performed at [facility], [place], [place] [address]    Gram Stain   Final    RARE WBC PRESENT,BOTH PMN AND MONONUCLEAR RARE GRAM POSITIVE COCCI    Culture   Final    FEW STAPHYLOCOCCUS AUREUS RARE STREPTOCOCCUS MITIS/ORALIS RARE KLEBSIELLA PNEUMONIAE NO ANAEROBES ISOLATED Performed at [facility], [place] [address] [place],  [address]    Report Status 11/11/2019 FINAL  Final   Organism ID, Bacteria STAPHYLOCOCCUS AUREUS  Final   Organism ID, Bacteria STREPTOCOCCUS MITIS/ORALIS  Final   Organism ID, Bacteria KLEBSIELLA PNEUMONIAE  Final      Susceptibility   Klebsiella pneumoniae - MIC*    AMPICILLIN >=32 RESISTANT Resistant     CEFAZOLIN <=4 SENSITIVE Sensitive     CEFEPIME <=0.12 SENSITIVE Sensitive     CEFTAZIDIME <=1 SENSITIVE Sensitive     CEFTRIAXONE <=0.25 SENSITIVE Sensitive     CIPROFLOXACIN <=0.25 SENSITIVE Sensitive     GENTAMICIN <=1 SENSITIVE Sensitive     IMIPENEM <=0.25 SENSITIVE Sensitive     TRIMETH/SULFA <=20 SENSITIVE Sensitive     AMPICILLIN/SULBACTAM 4 SENSITIVE Sensitive     PIP/TAZO <=4 SENSITIVE Sensitive     * RARE KLEBSIELLA PNEUMONIAE   Staphylococcus aureus - MIC*    CIPROFLOXACIN <=0.5 SENSITIVE Sensitive     ERYTHROMYCIN 4 INTERMEDIATE Intermediate     GENTAMICIN  <=0.5 SENSITIVE Sensitive     OXACILLIN 0.5 SENSITIVE Sensitive     TETRACYCLINE <=1 SENSITIVE Sensitive     VANCOMYCIN <=0.5 SENSITIVE Sensitive     TRIMETH/SULFA <=10 SENSITIVE Sensitive     CLINDAMYCIN <=0.25 SENSITIVE Sensitive     RIFAMPIN <=0.5 SENSITIVE Sensitive     Inducible Clindamycin NEGATIVE Sensitive     * FEW STAPHYLOCOCCUS AUREUS   Streptococcus mitis/oralis - MIC*    TETRACYCLINE <=0.25 SENSITIVE Sensitive     VANCOMYCIN 0.25 SENSITIVE Sensitive     CLINDAMYCIN <=0.25 SENSITIVE Sensitive     PENICILLIN Value in next row Sensitive      SENSITIVE0.06    CEFTRIAXONE Value in next row Sensitive      SENSITIVE0.12    * RARE STREPTOCOCCUS MITIS/ORALIS  Surgical pcr screen     Status: Abnormal   Collection Time: 11/07/19  3:40 PM   Specimen: Nasal Mucosa; Nasal Swab  Result Value Ref Range Status   MRSA, PCR NEGATIVE NEGATIVE Final   Staphylococcus aureus POSITIVE (A) NEGATIVE Final    Comment: (NOTE) The Xpert SA Assay (FDA approved for NASAL specimens in patients 28 years of age and older), is one component of a comprehensive surveillance  program. It is not intended to diagnose infection nor to guide or monitor treatment. Performed at [facility], [address] [place], [place] [address]   Culture, blood (Routine X 2) w Reflex to ID Panel     Status: None (Preliminary result)   Collection Time: 11/08/19 12:11 AM   Specimen: BLOOD  Result Value Ref Range Status   Specimen Description BLOOD RIGHT ANTECUBITAL  Final   Special Requests   Final    BOTTLES DRAWN AEROBIC AND ANAEROBIC Blood Culture results may not be optimal due to an excessive volume of blood received in culture bottles   Culture   Final    NO GROWTH 4 DAYS Performed at [facility], [address] [place], [place] [address]    Report Status PENDING  Incomplete  Culture, blood (Routine X 2) w Reflex to ID Panel     Status: None (Preliminary result)   Collection Time: 11/08/19  12:17 AM   Specimen: BLOOD  Result Value Ref Range Status   Specimen Description BLOOD LEFT ANTECUBITAL  Final   Special Requests   Final    BOTTLES DRAWN AEROBIC AND ANAEROBIC Blood Culture results may not be optimal due to an excessive volume of blood received in culture bottles   Culture   Final    NO GROWTH 4 DAYS Performed at [facility], [address], [place], [place] [address]    Report Status PENDING  Incomplete      Radiology Studies: [place] EKG SITE RITE  Result Date: 11/11/2019 If Site Rite image not attached, placement could not be confirmed due to current cardiac rhythm.    Scheduled Meds:  brimonidine  1 drop Both Eyes BID   chlorhexidine  60 mL Topical Once   Chlorhexidine Gluconate Cloth  6 each Topical Daily   cholecalciferol  1,000 Units Oral Daily   enoxaparin (LOVENOX) injection  40 mg Subcutaneous Q24H   insulin aspart  0-15 Units Subcutaneous TID PC,HS,0200   insulin detemir  10 Units Subcutaneous BID   iron polysaccharides  150 mg Oral Daily   povidone-iodine  2 application Topical Once   sodium chloride flush  10-40 mL Intracatheter Q12H   timolol  1 drop Both Eyes BID   Continuous Infusions:  sodium chloride 250 mL (11/11/19 1415)    ceFAZolin (ANCEF) IV 2 g (11/12/19 0558)   metronidazole 500 mg (11/12/19 1129)     LOS: 7 days     Enzo Bi, MD [facility] If 7PM-7AM, please contact night-coverage 11/12/2019, 5:21 PM

## 2019-11-13 ENCOUNTER — Encounter: Payer: Self-pay | Admitting: Family Medicine

## 2019-11-13 ENCOUNTER — Encounter
Admission: EM | Disposition: A | Discharge status: Skilled Nursing Facility | Payer: Self-pay | Source: Home / Self Care | Attending: Hospitalist

## 2019-11-13 DIAGNOSIS — M869 Osteomyelitis, unspecified: Secondary | ICD-10-CM | POA: Diagnosis not present

## 2019-11-13 DIAGNOSIS — R7881 Bacteremia: Secondary | ICD-10-CM | POA: Diagnosis not present

## 2019-11-13 DIAGNOSIS — E119 Type 2 diabetes mellitus without complications: Secondary | ICD-10-CM | POA: Diagnosis not present

## 2019-11-13 DIAGNOSIS — B9561 Methicillin susceptible Staphylococcus aureus infection as the cause of diseases classified elsewhere: Secondary | ICD-10-CM | POA: Diagnosis not present

## 2019-11-13 LAB — BASIC METABOLIC PANEL
Anion gap: 6 (ref 5–15)
BUN: 16 mg/dL (ref 8–23)
CO2: 31 mmol/L (ref 22–32)
Calcium: 8.1 mg/dL — ABNORMAL LOW (ref 8.9–10.3)
Chloride: 94 mmol/L — ABNORMAL LOW (ref 98–111)
Creatinine, Ser: 1.02 mg/dL (ref 0.61–1.24)
GFR, Estimated: >60 mL/min (ref >60)
Glucose, Bld: 135 mg/dL — ABNORMAL HIGH (ref 70–99)
Potassium: 4.4 mmol/L (ref 3.5–5.1)
Sodium: 131 mmol/L — ABNORMAL LOW (ref 135–145)

## 2019-11-13 LAB — GLUCOSE, CAPILLARY
Glucose-Capillary: 171 mg/dL — ABNORMAL HIGH (ref 70–99)
Glucose-Capillary: 147 mg/dL — ABNORMAL HIGH (ref 70–99)
Glucose-Capillary: 136 mg/dL — ABNORMAL HIGH (ref 70–99)
Glucose-Capillary: 126 mg/dL — ABNORMAL HIGH (ref 70–99)

## 2019-11-13 LAB — CULTURE, BLOOD (ROUTINE X 2)
Culture: NO GROWTH
Culture: NO GROWTH

## 2019-11-13 LAB — CBC
HCT: 29.7 % — ABNORMAL LOW (ref 39.0–52.0)
Hemoglobin: 9.3 g/dL — ABNORMAL LOW (ref 13.0–17.0)
MCH: 23.5 pg — ABNORMAL LOW (ref 26.0–34.0)
MCHC: 31.3 g/dL (ref 30.0–36.0)
MCV: 75.2 fL — ABNORMAL LOW (ref 80.0–100.0)
Platelets: 588 10*3/uL — ABNORMAL HIGH (ref 150–400)
RBC: 3.95 MIL/uL — ABNORMAL LOW (ref 4.22–5.81)
RDW: 13.8 % (ref 11.5–15.5)
WBC: 9.6 10*3/uL (ref 4.0–10.5)
nRBC: 0 % (ref 0.0–0.2)

## 2019-11-13 LAB — MAGNESIUM: Magnesium: 2 mg/dL (ref 1.7–2.4)

## 2019-11-13 SURGERY — AMPUTATION, FOOT, RAY
Anesthesia: General | Laterality: Right

## 2019-11-13 MED ORDER — MUPIROCIN 2 % EX OINT
1.0000 "application " | TOPICAL_OINTMENT | Freq: Two times a day (BID) | CUTANEOUS | Status: AC
  Administered 2019-11-13 – 2019-11-17 (×10): 1 via NASAL
  Filled 2019-11-13: qty 22

## 2019-11-13 MED ORDER — CHLORHEXIDINE GLUCONATE CLOTH 2 % EX PADS
6.0000 | MEDICATED_PAD | Freq: Every day | CUTANEOUS | Status: AC
  Administered 2019-11-14 – 2019-11-17 (×4): 6 via TOPICAL

## 2019-11-13 MED ORDER — PROPOFOL 500 MG/50ML IV EMUL
INTRAVENOUS | Status: AC
  Filled 2019-11-13: qty 50

## 2019-11-13 MED ORDER — INSULIN ASPART 100 UNIT/ML ~~LOC~~ SOLN
0.0000 [IU] | Freq: Three times a day (TID) | SUBCUTANEOUS | Status: DC
  Administered 2019-11-13: 1 [IU] via SUBCUTANEOUS
  Administered 2019-11-14 (×3): 2 [IU] via SUBCUTANEOUS
  Administered 2019-11-15: 3 [IU] via SUBCUTANEOUS
  Administered 2019-11-15 (×2): 2 [IU] via SUBCUTANEOUS
  Administered 2019-11-16 – 2019-11-17 (×5): 3 [IU] via SUBCUTANEOUS
  Administered 2019-11-18: 8 [IU] via SUBCUTANEOUS
  Administered 2019-11-18: 3 [IU] via SUBCUTANEOUS
  Administered 2019-11-18: 8 [IU] via SUBCUTANEOUS
  Administered 2019-11-19 (×2): 2 [IU] via SUBCUTANEOUS
  Administered 2019-11-19: 3 [IU] via SUBCUTANEOUS
  Administered 2019-11-20: 2 [IU] via SUBCUTANEOUS
  Administered 2019-11-20: 8 [IU] via SUBCUTANEOUS
  Filled 2019-11-13 (×20): qty 1

## 2019-11-13 MED ORDER — INSULIN DETEMIR 100 UNIT/ML ~~LOC~~ SOLN
13.0000 [IU] | Freq: Two times a day (BID) | SUBCUTANEOUS | Status: DC
  Administered 2019-11-13 – 2019-11-16 (×7): 13 [IU] via SUBCUTANEOUS
  Filled 2019-11-13 (×8): qty 0.13

## 2019-11-13 MED ORDER — FENTANYL CITRATE (PF) 100 MCG/2ML IJ SOLN
INTRAMUSCULAR | Status: AC
  Filled 2019-11-13: qty 2

## 2019-11-13 MED ORDER — CYCLOBENZAPRINE HCL 10 MG PO TABS
10.0000 mg | ORAL_TABLET | Freq: Three times a day (TID) | ORAL | Status: DC
  Administered 2019-11-13 – 2019-11-20 (×21): 10 mg via ORAL
  Filled 2019-11-13 (×21): qty 1

## 2019-11-13 MED ORDER — MIDAZOLAM HCL 2 MG/2ML IJ SOLN
INTRAMUSCULAR | Status: AC
  Filled 2019-11-13: qty 2

## 2019-11-13 SURGICAL SUPPLY — 43 items
BLADE OSC/SAGITTAL MD 5.5X18 (BLADE) ×2 IMPLANT
BLADE SURG MINI STRL (BLADE) ×2 IMPLANT
BNDG CONFORM 2 STRL LF (GAUZE/BANDAGES/DRESSINGS) ×2 IMPLANT
BNDG CONFORM 3 STRL LF (GAUZE/BANDAGES/DRESSINGS) ×4 IMPLANT
BNDG ELASTIC 4X5.8 VLCR NS LF (GAUZE/BANDAGES/DRESSINGS) ×2 IMPLANT
BNDG ESMARK 4X12 TAN STRL LF (GAUZE/BANDAGES/DRESSINGS) ×2 IMPLANT
BNDG GAUZE 4.5X4.1 6PLY STRL (MISCELLANEOUS) ×2 IMPLANT
CANISTER SUCT 1200ML W/VALVE (MISCELLANEOUS) ×2 IMPLANT
COVER WAND RF STERILE (DRAPES) ×2 IMPLANT
CUFF TOURN SGL QUICK 12 (TOURNIQUET CUFF) IMPLANT
CUFF TOURN SGL QUICK 18X4 (TOURNIQUET CUFF) IMPLANT
DRAPE FLUOR MINI C-ARM 54X84 (DRAPES) ×2 IMPLANT
DRAPE XRAY CASSETTE 23X24 (DRAPES) ×2 IMPLANT
DURAPREP 26ML APPLICATOR (WOUND CARE) ×2 IMPLANT
ELECT REM PT RETURN 9FT ADLT (ELECTROSURGICAL) ×2
ELECTRODE REM PT RTRN 9FT ADLT (ELECTROSURGICAL) ×1 IMPLANT
GAUZE PACKING IODOFORM 1/2 (PACKING) ×2 IMPLANT
GAUZE SPONGE 4X4 12PLY STRL (GAUZE/BANDAGES/DRESSINGS) ×2 IMPLANT
GAUZE XEROFORM 1X8 LF (GAUZE/BANDAGES/DRESSINGS) ×2 IMPLANT
GLOVE BIO SURGEON STRL SZ7.5 (GLOVE) ×2 IMPLANT
GLOVE INDICATOR 8.0 STRL GRN (GLOVE) ×2 IMPLANT
GOWN STRL REUS W/ TWL XL LVL3 (GOWN DISPOSABLE) ×2 IMPLANT
GOWN STRL REUS W/TWL XL LVL3 (GOWN DISPOSABLE) ×2
KIT TURNOVER KIT A (KITS) ×2 IMPLANT
LABEL OR SOLS (LABEL) ×2 IMPLANT
NEEDLE FILTER BLUNT 18X 1/2SAF (NEEDLE) ×1
NEEDLE FILTER BLUNT 18X1 1/2 (NEEDLE) ×1 IMPLANT
NEEDLE HYPO 25X1 1.5 SAFETY (NEEDLE) ×2 IMPLANT
NS IRRIG 500ML POUR BTL (IV SOLUTION) ×2 IMPLANT
PACK EXTREMITY (MISCELLANEOUS) ×2 IMPLANT
PAD ABD DERMACEA PRESS 5X9 (GAUZE/BANDAGES/DRESSINGS) ×4 IMPLANT
PULSAVAC PLUS IRRIG FAN TIP (DISPOSABLE) ×2
SHIELD FULL FACE ANTIFOG 7M (MISCELLANEOUS) ×2 IMPLANT
SOL .9 NS 3000ML IRR  AL (IV SOLUTION) ×1
SOL .9 NS 3000ML IRR UROMATIC (IV SOLUTION) ×1 IMPLANT
STOCKINETTE M/LG 89821 (MISCELLANEOUS) ×2 IMPLANT
STRAP SAFETY 5IN WIDE (MISCELLANEOUS) ×2 IMPLANT
SUT ETHILON 3-0 FS-10 30 BLK (SUTURE) ×2
SUT ETHILON 5-0 FS-2 18 BLK (SUTURE) ×2 IMPLANT
SUT VIC AB 4-0 FS2 27 (SUTURE) ×2 IMPLANT
SUTURE EHLN 3-0 FS-10 30 BLK (SUTURE) ×1 IMPLANT
SYR 10ML LL (SYRINGE) ×6 IMPLANT
TIP FAN IRRIG PULSAVAC PLUS (DISPOSABLE) ×1 IMPLANT

## 2019-11-13 NOTE — Progress Notes (Signed)
PT Cancellation Note  Patient Details Name: Gary Sutton MRN: [id number] DOB: Jun 14, 1960   Cancelled Treatment:    Reason Eval/Treat Not Completed: Other (comment). Pt pending podiatry Sx this date. Due to change in status, will cancel current PT orders. Will need new orders to resume care with updated WBing status. Please re-order when medically stable.   Nadezhda Pollitt 11/13/2019, 11:29 AM  Elizabeth Palau, PT, DPT [phone]

## 2019-11-13 NOTE — TOC Progression Note (Addendum)
Transition of Care [facility]) - Progression Note    Patient Details  Name: Gary Sutton MRN: [id number] Date of Birth: 11/10/1960  Transition of Care [facility]) CM/SW Contact  Liliana Cline, LCSW Phone Number: 11/13/2019, 4:00 PM  Clinical Narrative:   Informed by MD that patient refused surgery today and was asked to go ahead and pursue SNF. Spoke to patient who says he does not want to go to a SNF. Asked what his discharge plan is. Patient said he lives with his son and plans to discharge back home. Patient said his daughter or son provides transportation when they are available, otherwise he uses Pharmacist, community. He reported he has PCP resources as previously discussed. No DME or HH or SNF history. Patient denied any needs. Says he doesn't believe he needs surgery or SNF. Updated MD.   4:15-  Attempted call to son Cristal Deer to see if he is willing for patient to discharge home as patient will need IV antibiotics at home. Left voicemail for son requesting a return call.          Expected Discharge Plan and Services                                                 Social Determinants of Health (SDOH) Interventions    Readmission Risk Interventions No flowsheet data found.

## 2019-11-13 NOTE — Treatment Plan (Signed)
Diagnosis: Staphylococcus bacteremia with rt great toe infection/osteomyelitis Baseline Creatinine 1    No Known Allergies  OPAT Orders Discharge antibiotics: Cefazolin 2 grams IV every 8hours until 12/18/19   [facility] Care Per Protocol:  Labs weekly on Mondays while on IV antibiotics: _X_ CBC with differential __ BMP __X CMP _X_ CRP _X_ ESR   _X_ Please pull PIC at completion of IV antibiotics _  Fax weekly labs to [place] [phone]  Clinic Follow Up Appt: 12/10/19 at 9.45 Am   Call [phone] to make appt

## 2019-11-13 NOTE — Plan of Care (Signed)
At time of arrival of transport pt declined/refused surgery - stating "he wanted more research done on this bacteria in his foot - notified surgeon and hospitalist.  Diet reinstated and pt denies any new concerns

## 2019-11-13 NOTE — Progress Notes (Signed)
Surgery was planned for today.  Orally went to get patient and patient refused surgery.  I specifically spoke to the patient who states he does not believe that now is the time to have surgery.  Once again reinforced he had an infection with osteomyelitis with obvious erosive changes and MRI that shows severe infection in his foot.  Strongly suggested patient continue with surgery as he is n.p.o. but he is still refusing surgery.  This is the second refusal for surgery and cancellation that has been performed.  At this point will sign off once again.

## 2019-11-13 NOTE — Progress Notes (Signed)
PROGRESS NOTE    Gary Sutton  NWG:[id number] DOB: 10/28/1960 DOA: 11/05/2019 PCP: Patient, No Pcp Per    Assessment & Plan:   Active Problems:   Toe osteomyelitis (HCC)   Gary Sutton  is a 59 y.o. male with a known history of type 2 diabetes mellitus, glaucoma and degenerative disc disease, who presented to the emergency room with acute onset of right lower extremity pain involving his leg with associated right foot swelling with erythema at the big toe.  The patient stated that he had pain around his right hip flexors and has not had any trauma.  He admitted to chills but did not check his temperature.  His right big toe has been swelling over the last week.  No chest pain or dyspnea or cough or wheezing.  No dysuria, oliguria or hematuria or flank pain.   # Sepsis 2/2  # Right big toe diabetic ulcer and osteomyelitis involving the first MTP # Staph bacteremia --fever and tachycardia as well as leukocytosis on presentation. --started on cefepime and vancomycin and switched to cefazolin, and then Flagyl added, per ID. --Despite repeated urging from podiatry, ID and I, pt had been refusing surgical I/D of his right foot osteomyelitis, but finally agreed to it on 10/18.  PLAN: --cont cefazolin and Flagyl. --OR for surgical resection of right first ray today, pt refused at the last minute when transport came. --Discussed with podiatry, ID and TOC, pt will be discharged with abx as soon as that can be arranged.  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  MRI lumbar spine, right hip showed the above findings. --CK not elevated. PLAN: --supportive care for now --Flexeril PRN and tramadol PRN  2.  Type 2 diabetes mellitus, poorly controlled -on home metformin, but reportedly didn't take. --A1c 12.6 --Diabetic  educator involved.  Pt is refusing to start insulin after discharge. PLAN: --cont Levemir 10u BID while inpatient --SSI --cont current regimen --No need to order glucometer for home use because pt refuses insulin for home use.  Will have pt follow up as outpatient.  3.  Glaucoma. -continue his Travatan and Timoptic ophthalmic gtts   # Anemia, microcytic, def in iron and vit B12 --anemia workup showed low vit B12 and low iron --Vit B12 injection x1  PLAN: --cont oral vit B12 suppl --cont oral iron suppl  # Suspect multiple myeloma --increased in urine protein, MRI spine showing possible marrow replacement processes. --SPEP neg for MM.  "pattern appears to be compatible with a chronic inflammatory response."   DVT prophylaxis: Lovenox SQ Code Status: Full code  Family Communication:  Status is: inpatient Dispo:   The patient is from: home Anticipated d/c is to: home (pt now refusing SNF) Anticipated d/c date is: 1-2 days Patient currently is not medically stable to d/c due to: Need to set up home abx.  TOC to talk to son to see whether son is willing to take pt home.     Subjective and Interval History:  Pt was scheduled for surgical amputation of first right ray, but at last minute when transport came, pt again refused surgery.  Group discussion initiated, to plan for discharging pt with IV abx.  Pt also now refused SNF and wants to go home with son.  TOC to discuss with son.   Objective: Vitals:  11/12/19 1500 11/12/19 2322 11/13/19 0829 11/13/19 1550  BP: 139/76 111/72 133/79 113/75  Pulse: 96 88 90 99  Resp: _0 Temp: 98.6 F (37 C) 98.5 F (36.9 C) 98 F (36.7 C) 97.9 F (36.6 C)  TempSrc: Oral Oral Oral   SpO2: 99% 97% 95% 97%  Weight:      Height:        Intake/Output Summary (Last 24 hours) at 11/13/2019 1959 Last data filed at 11/13/2019 1732 Gross per 24 hour  Intake --  Output 2200 ml  Net -2200 ml   Filed Weights   11/05/19 1841   Weight: 86.2 kg    Examination:   Constitutional: NAD, AAOx3 HEENT: conjunctivae and lids normal, EOMI CV: No cyanosis.   RESP: normal respiratory effort, on RA Extremities: right foot wrapped.  Edema improved in RLE SKIN: warm, dry and intact Neuro: II - XII grossly intact.     Data Reviewed: I have personally reviewed following labs and imaging studies  CBC: Recent Labs  Lab 11/09/19 0324 11/10/19 0411 11/11/19 0150 11/12/19 0305 11/13/19 0337  WBC 12.3* 13.4* 10.3 10.3 9.6  HGB 9.3* 9.2* 9.5* 9.8* 9.3*  HCT 29.5* 28.4* 30.3* 31.2* 29.7*  MCV 73.8* 74.3* 75.0* 74.3* 75.2*  PLT 548* 534* 597* 641* 811*   Basic Metabolic Panel: Recent Labs  Lab 11/09/19 0324 11/10/19 0411 11/11/19 0150 11/12/19 0305 11/13/19 0337  NA 131* 131* 131* 130* 131*  K 3.8 4.7 4.7 4.5 4.4  CL 95* 95* 94* 93* 94*  CO2 26 32 _1 GLUCOSE 95 121* 148* 145* 135*  BUN _2 CREATININE 0.89 0.97 1.01 1.00 1.02  CALCIUM 7.6* 7.5* 7.9* 8.3* 8.1*  MG 2.0 2.0 2.2 2.0 2.0   GFR: Estimated Creatinine Clearance: 88.4 mL/min (by C-G formula based on SCr of 1.02 mg/dL). Liver Function Tests: No results for input(s): AST, ALT, ALKPHOS, BILITOT, PROT, ALBUMIN in the last 168 hours. No results for input(s): LIPASE, AMYLASE in the last 168 hours. No results for input(s): AMMONIA in the last 168 hours. Coagulation Profile: No results for input(s): INR, PROTIME in the last 168 hours. Cardiac Enzymes: Recent Labs  Lab 11/07/19 0416  CKTOTAL 40*   BNP (last 3 results) No results for input(s): PROBNP in the last 8760 hours. HbA1C: No results for input(s): HGBA1C in the last 72 hours. CBG: Recent Labs  Lab 11/12/19 2130 11/13/19 0201 11/13/19 0812 11/13/19 1203 11/13/19 1623  GLUCAP 176* 171* 147* 136* 126*   Lipid Profile: No results for input(s): CHOL, HDL, LDLCALC, TRIG, CHOLHDL, LDLDIRECT in the last 72 hours. Thyroid Function Tests: No results for input(s): TSH,  T4TOTAL, FREET4, T3FREE, THYROIDAB in the last 72 hours. Anemia Panel: No results for input(s): VITAMINB12, FOLATE, FERRITIN, TIBC, IRON, RETICCTPCT in the last 72 hours. Sepsis Labs: No results for input(s): PROCALCITON, LATICACIDVEN in the last 168 hours.  Recent Results (from the past 240 hour(s))  Culture, blood (Routine x 2)     Status: Abnormal   Collection Time: 11/05/19  6:50 PM   Specimen: BLOOD  Result Value Ref Range Status   Specimen Description   Final    BLOOD BLOOD LEFT FOREARM Performed at [facility], [address], [place], [place] [address]    Special Requests   Final    BOTTLES DRAWN AEROBIC AND ANAEROBIC Blood Culture adequate volume Performed at [facility], [address], [place], [place] [address]  Culture  Setup Time   Final    GRAM POSITIVE COCCI IN BOTH AEROBIC AND ANAEROBIC BOTTLES Organism ID to follow CRITICAL RESULT CALLED TO, READ BACK BY AND VERIFIED WITH: KAREN HAYES 11/06/19 0752 KLW Performed at [facility], [address] [place], [place] [address]    Culture STAPHYLOCOCCUS AUREUS (A)  Final   Report Status 11/08/2019 FINAL  Final   Organism ID, Bacteria STAPHYLOCOCCUS AUREUS  Final      Susceptibility   Staphylococcus aureus - MIC*    CIPROFLOXACIN <=0.5 SENSITIVE Sensitive     ERYTHROMYCIN >=8 RESISTANT Resistant     GENTAMICIN <=0.5 SENSITIVE Sensitive     OXACILLIN <=0.25 SENSITIVE Sensitive     TETRACYCLINE <=1 SENSITIVE Sensitive     VANCOMYCIN <=0.5 SENSITIVE Sensitive     TRIMETH/SULFA <=10 SENSITIVE Sensitive     CLINDAMYCIN <=0.25 SENSITIVE Sensitive     RIFAMPIN <=0.5 SENSITIVE Sensitive     Inducible Clindamycin NEGATIVE Sensitive     * STAPHYLOCOCCUS AUREUS  Blood Culture ID Panel (Reflexed)     Status: Abnormal   Collection Time: 11/05/19  6:50 PM  Result Value Ref Range Status   Enterococcus faecalis NOT DETECTED NOT DETECTED Final   Enterococcus Faecium NOT DETECTED NOT DETECTED  Final   Listeria monocytogenes NOT DETECTED NOT DETECTED Final   Staphylococcus species DETECTED (A) NOT DETECTED Final    Comment: CRITICAL RESULT CALLED TO, READ BACK BY AND VERIFIED WITH: KAREN HAYES 11/06/19 0752 KLW    Staphylococcus aureus (BCID) DETECTED (A) NOT DETECTED Final    Comment: CRITICAL RESULT CALLED TO, READ BACK BY AND VERIFIED WITH: KAREN HAYES 11/06/19 0752 KLW    Staphylococcus epidermidis NOT DETECTED NOT DETECTED Final   Staphylococcus lugdunensis NOT DETECTED NOT DETECTED Final   Streptococcus species NOT DETECTED NOT DETECTED Final   Streptococcus agalactiae NOT DETECTED NOT DETECTED Final   Streptococcus pneumoniae NOT DETECTED NOT DETECTED Final   Streptococcus pyogenes NOT DETECTED NOT DETECTED Final   A.calcoaceticus-baumannii NOT DETECTED NOT DETECTED Final   Bacteroides fragilis NOT DETECTED NOT DETECTED Final   Enterobacterales NOT DETECTED NOT DETECTED Final   Enterobacter cloacae complex NOT DETECTED NOT DETECTED Final   Escherichia coli NOT DETECTED NOT DETECTED Final   Klebsiella aerogenes NOT DETECTED NOT DETECTED Final   Klebsiella oxytoca NOT DETECTED NOT DETECTED Final   Klebsiella pneumoniae NOT DETECTED NOT DETECTED Final   Proteus species NOT DETECTED NOT DETECTED Final   Salmonella species NOT DETECTED NOT DETECTED Final   Serratia marcescens NOT DETECTED NOT DETECTED Final   Haemophilus influenzae NOT DETECTED NOT DETECTED Final   Neisseria meningitidis NOT DETECTED NOT DETECTED Final   Pseudomonas aeruginosa NOT DETECTED NOT DETECTED Final   Stenotrophomonas maltophilia NOT DETECTED NOT DETECTED Final   Candida albicans NOT DETECTED NOT DETECTED Final   Candida auris NOT DETECTED NOT DETECTED Final   Candida glabrata NOT DETECTED NOT DETECTED Final   Candida krusei NOT DETECTED NOT DETECTED Final   Candida parapsilosis NOT DETECTED NOT DETECTED Final   Candida tropicalis NOT DETECTED NOT DETECTED Final   Cryptococcus  neoformans/gattii NOT DETECTED NOT DETECTED Final   Meth resistant mecA/C and MREJ NOT DETECTED NOT DETECTED Final    Comment: Performed at [facility], [address] [place], [place] [address]  Culture, blood (Routine x 2)     Status: Abnormal   Collection Time: 11/05/19  8:47 PM   Specimen: BLOOD  Result Value Ref Range Status   Specimen  Description   Final    BLOOD LEFT FA Performed at [facility], [address], [place], [place] [address]    Special Requests   Final    BOTTLES DRAWN AEROBIC AND ANAEROBIC Blood Culture adequate volume Performed at [facility], [address] [place], [place] [address]    Culture  Setup Time   Final    GRAM POSITIVE COCCI IN BOTH AEROBIC AND ANAEROBIC BOTTLES CRITICAL VALUE NOTED.  VALUE IS CONSISTENT WITH PREVIOUSLY REPORTED AND CALLED VALUE. Performed at [facility], [address] [place], [place] [address]    Culture (A)  Final    STAPHYLOCOCCUS AUREUS SUSCEPTIBILITIES PERFORMED ON PREVIOUS CULTURE WITHIN THE LAST 5 DAYS. Performed at [facility], [place] [address] [place], [place] [address]    Report Status 11/08/2019 FINAL  Final  Respiratory Panel by RT PCR (Flu A&B, Covid) - Nasopharyngeal Swab     Status: None   Collection Time: 11/05/19  9:27 PM   Specimen: Nasopharyngeal Swab  Result Value Ref Range Status   SARS Coronavirus 2 by RT PCR NEGATIVE NEGATIVE Final    Comment: (NOTE) SARS-CoV-2 target nucleic acids are NOT DETECTED.  The SARS-CoV-2 RNA is generally detectable in upper respiratoy specimens during the acute phase of infection. The lowest concentration of SARS-CoV-2 viral copies this assay can detect is 131 copies/mL. A negative result does not preclude SARS-Cov-2 infection and should not be used as the sole basis for treatment or other patient management decisions. A negative result may occur with  improper specimen collection/handling, submission of specimen  other than nasopharyngeal swab, presence of viral mutation(s) within the areas targeted by this assay, and inadequate number of viral copies (<131 copies/mL). A negative result must be combined with clinical observations, patient history, and epidemiological information. The expected result is Negative.  Fact Sheet for Patients:  PinkCheek.be  Fact Sheet for Healthcare Providers:  GravelBags.it  This test is no t yet approved or cleared by the [place] FDA and  has been authorized for detection and/or diagnosis of SARS-CoV-2 by FDA under an Emergency Use Authorization (EUA). This EUA will remain  in effect (meaning this test can be used) for the duration of the COVID-19 declaration under Section 564(b)(1) of the Act, 21 U.S.C. section 360bbb-3(b)(1), unless the authorization is terminated or revoked sooner.     Influenza A by PCR NEGATIVE NEGATIVE Final   Influenza B by PCR NEGATIVE NEGATIVE Final    Comment: (NOTE) The Xpert Xpress SARS-CoV-2/FLU/RSV assay is intended as an aid in  the diagnosis of influenza from Nasopharyngeal swab specimens and  should not be used as a sole basis for treatment. Nasal washings and  aspirates are unacceptable for Xpert Xpress SARS-CoV-2/FLU/RSV  testing.  Fact Sheet for Patients: PinkCheek.be  Fact Sheet for Healthcare Providers: GravelBags.it  This test is not yet approved or cleared by the [place] FDA and  has been authorized for detection and/or diagnosis of SARS-CoV-2 by  FDA under an Emergency Use Authorization (EUA). This EUA will remain  in effect (meaning this test can be used) for the duration of the  Covid-19 declaration under Section 564(b)(1) of the Act, 21  U.S.C. section 360bbb-3(b)(1), unless the authorization is  terminated or revoked. Performed at [facility], [address]  [place], [place] [address]   C Difficile Quick Screen w PCR reflex     Status: None   Collection Time: 11/06/19  9:48 AM   Specimen: STOOL  Result Value Ref Range Status   C Diff antigen NEGATIVE NEGATIVE Final   C Diff toxin NEGATIVE NEGATIVE Final   C Diff interpretation No C. difficile detected.  Final    Comment: Performed at [facility], [address] [place], [place] [address]  Aerobic/Anaerobic Culture (surgical/deep wound)     Status: None   Collection Time: 11/06/19  5:55 PM   Specimen: Toe  Result Value Ref Range Status   Specimen Description   Final    TOE LEFT GREAT TOE Performed at [facility], [address], [place], [place] [address]    Special Requests   Final    NONE Performed at [facility], [place], [place] [address]    Gram Stain   Final    RARE WBC PRESENT,BOTH PMN AND MONONUCLEAR RARE GRAM POSITIVE COCCI    Culture   Final    FEW STAPHYLOCOCCUS AUREUS RARE STREPTOCOCCUS MITIS/ORALIS RARE KLEBSIELLA PNEUMONIAE NO ANAEROBES ISOLATED Performed at [facility], [place] [address] [place], [place] [address]    Report Status 11/11/2019 FINAL  Final   Organism ID, Bacteria STAPHYLOCOCCUS AUREUS  Final   Organism ID, Bacteria STREPTOCOCCUS MITIS/ORALIS  Final   Organism ID, Bacteria KLEBSIELLA PNEUMONIAE  Final      Susceptibility   Klebsiella pneumoniae - MIC*    AMPICILLIN >=32 RESISTANT Resistant     CEFAZOLIN <=4 SENSITIVE Sensitive     CEFEPIME <=0.12 SENSITIVE Sensitive     CEFTAZIDIME <=1 SENSITIVE Sensitive     CEFTRIAXONE <=0.25 SENSITIVE Sensitive     CIPROFLOXACIN <=0.25 SENSITIVE Sensitive     GENTAMICIN <=1 SENSITIVE Sensitive     IMIPENEM <=0.25 SENSITIVE Sensitive     TRIMETH/SULFA <=20 SENSITIVE Sensitive     AMPICILLIN/SULBACTAM 4 SENSITIVE Sensitive     PIP/TAZO <=4 SENSITIVE Sensitive     * RARE KLEBSIELLA PNEUMONIAE   Staphylococcus aureus - MIC*    CIPROFLOXACIN <=0.5 SENSITIVE  Sensitive     ERYTHROMYCIN 4 INTERMEDIATE Intermediate     GENTAMICIN <=0.5 SENSITIVE Sensitive     OXACILLIN 0.5 SENSITIVE Sensitive     TETRACYCLINE <=1 SENSITIVE Sensitive     VANCOMYCIN <=0.5 SENSITIVE Sensitive     TRIMETH/SULFA <=10 SENSITIVE Sensitive     CLINDAMYCIN <=0.25 SENSITIVE Sensitive     RIFAMPIN <=0.5 SENSITIVE Sensitive     Inducible Clindamycin NEGATIVE Sensitive     * FEW STAPHYLOCOCCUS AUREUS   Streptococcus mitis/oralis - MIC*    TETRACYCLINE <=0.25 SENSITIVE Sensitive     VANCOMYCIN 0.25 SENSITIVE Sensitive     CLINDAMYCIN <=0.25 SENSITIVE Sensitive     PENICILLIN Value in next row Sensitive      SENSITIVE0.06    CEFTRIAXONE Value in next row Sensitive      SENSITIVE0.12    * RARE STREPTOCOCCUS MITIS/ORALIS  Surgical pcr screen     Status: Abnormal   Collection Time: 11/07/19  3:40 PM   Specimen: Nasal Mucosa; Nasal Swab  Result Value Ref Range Status   MRSA, PCR NEGATIVE NEGATIVE Final   Staphylococcus aureus POSITIVE (A) NEGATIVE Final    Comment: (NOTE) The Xpert SA Assay (FDA approved for NASAL specimens in patients 11 years of age and older), is one component of a comprehensive surveillance program. It is not intended to diagnose infection nor to guide or monitor treatment. Performed at [facility], [address] [place], [place] [address]   Culture, blood (Routine X 2) w Reflex to ID Panel  Status: None   Collection Time: 11/08/19 12:11 AM   Specimen: BLOOD  Result Value Ref Range Status   Specimen Description BLOOD RIGHT ANTECUBITAL  Final   Special Requests   Final    BOTTLES DRAWN AEROBIC AND ANAEROBIC Blood Culture results may not be optimal due to an excessive volume of blood received in culture bottles   Culture   Final    NO GROWTH 5 DAYS Performed at [facility], [address] [place], [place] [address]    Report Status 11/13/2019 FINAL  Final  Culture, blood (Routine X 2) w Reflex to ID Panel     Status:  None   Collection Time: 11/08/19 12:17 AM   Specimen: BLOOD  Result Value Ref Range Status   Specimen Description BLOOD LEFT ANTECUBITAL  Final   Special Requests   Final    BOTTLES DRAWN AEROBIC AND ANAEROBIC Blood Culture results may not be optimal due to an excessive volume of blood received in culture bottles   Culture   Final    NO GROWTH 5 DAYS Performed at [facility], [address], [place], [place] [address]    Report Status 11/13/2019 FINAL  Final      Radiology Studies: No results found.   Scheduled Meds: . brimonidine  1 drop Both Eyes BID  . chlorhexidine  60 mL Topical Once  . Chlorhexidine Gluconate Cloth  6 each Topical Daily  . Chlorhexidine Gluconate Cloth  6 each Topical Daily  . cholecalciferol  1,000 Units Oral Daily  . cyclobenzaprine  10 mg Oral TID  . enoxaparin (LOVENOX) injection  40 mg Subcutaneous Q24H  . insulin aspart  0-15 Units Subcutaneous TID WC  . insulin detemir  13 Units Subcutaneous BID  . iron polysaccharides  150 mg Oral Daily  . mupirocin ointment  1 application Nasal BID  . povidone-iodine  2 application Topical Once  . sodium chloride flush  10-40 mL Intracatheter Q12H  . timolol  1 drop Both Eyes BID  . vitamin B-12  1,000 mcg Oral Daily   Continuous Infusions: . sodium chloride 250 mL (11/11/19 1415)  .  ceFAZolin (ANCEF) IV 2 g (11/13/19 1455)  . metronidazole 500 mg (11/13/19 1732)     LOS: 8 days     Enzo Bi, MD [facility] If 7PM-7AM, please contact night-coverage 11/13/2019, 7:59 PM

## 2019-11-13 NOTE — Progress Notes (Signed)
ID Pt refused to have surgery for his rt toe infection Says pain rt hip /upper thigh better Patient Vitals for the past 24 hrs:  BP Temp Temp src Pulse Resp SpO2  11/13/19 1550 113/75 97.9 F (36.6 C) -- 99 17 97 %  11/13/19 0829 133/79 98 F (36.7 C) Oral 90 16 95 %  11/12/19 2322 111/72 98.5 F (36.9 C) Oral 88 17 97 %   o/e In bed Awake Chest b/la ir entry Rt foot with rt great toe infection with discharge   CBC Latest Ref Rng & Units 11/13/2019 11/12/2019 11/11/2019  WBC 4.0 - 10.5 K/uL 9.6 10.3 10.3  Hemoglobin 13.0 - 17.0 g/dL 4.9(Q) 7.5(F) 1.6(B)  Hematocrit 39 - 52 % 29.7(L) 31.2(L) 30.3(L)  Platelets 150 - 400 K/uL 588(H) 641(H) 597(H)    CMP Latest Ref Rng & Units 11/13/2019 11/12/2019 11/11/2019  Glucose 70 - 99 mg/dL 846(K) 599(J) 570(V)  BUN 8 - 23 mg/dL 16 11 12   Creatinine 0.61 - 1.24 mg/dL 7.79 3.90  Sodium 135 - 145 mmol/L 131(L) 130(L) 131(L)  Potassium 3.5 - 5.1 mmol/L 4.4 4.5 4.7  Chloride 98 - 111 mmol/L 94(L) 93(L) 94(L)  CO2 22 - 32 mmol/L 31 27 30   Calcium 8.9 - 10.3 mg/dL 8.1(L) 8.3(L) 7.9(L)  Total Protein 6.5 - 8.1 g/dL - - -  Total Bilirubin 0.3 - 1.2 mg/dL - - -  Alkaline Phos 38 - 126 U/L - - -  AST 15 - 41 U/L - - -  ALT 0 - 44 U/L - - -    Micro Micro 11/05/19 4/4 MSSA 11/08/19 BC Ng 10/13 - wound culture- MSSA, kleb, strep mitis  Impression/recommendation  MSSA bacteremia - on cefazolin , repeat blood culture negative 2 d echo N Secondary to rt toe infection with osteomyelitis-. MRI showed Soft tissue ulcer along the medial first MTP joint with sinus tract extending to the first metatarsal head. 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 Moderate bilateral hip osteoarthritis. Trace right hip joint.effusion with 7 mm intra-articular body. 4. Mild right greater than left greater trochanteric bursitis.  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 Son will have to help him with IV antibiotics. Will discuss with his son Discussed with his care team

## 2019-11-13 NOTE — Progress Notes (Signed)
PHARMACY CONSULT NOTE FOR:  OUTPATIENT  PARENTERAL ANTIBIOTIC THERAPY (OPAT)  Indication: bacteremia and osteo Regimen: Cefazolin 2 grams IV every 8 hours End date: 12/18/2019  IV antibiotic discharge orders are pended. To discharging provider:  please sign these orders via discharge navigator,  Select New Orders & click on the button choice - Manage This Unsigned Work.     Thank you for allowing pharmacy to be a part of this patient's care.  Raiford Noble, PharmD Pharmacy Resident  11/13/2019 5:06 PM

## 2019-11-14 DIAGNOSIS — E119 Type 2 diabetes mellitus without complications: Secondary | ICD-10-CM | POA: Diagnosis not present

## 2019-11-14 DIAGNOSIS — M8610 Other acute osteomyelitis, unspecified site: Secondary | ICD-10-CM

## 2019-11-14 DIAGNOSIS — M869 Osteomyelitis, unspecified: Secondary | ICD-10-CM | POA: Diagnosis not present

## 2019-11-14 DIAGNOSIS — R7881 Bacteremia: Secondary | ICD-10-CM | POA: Diagnosis not present

## 2019-11-14 DIAGNOSIS — B9561 Methicillin susceptible Staphylococcus aureus infection as the cause of diseases classified elsewhere: Secondary | ICD-10-CM | POA: Diagnosis not present

## 2019-11-14 LAB — CBC
HCT: 31.4 % — ABNORMAL LOW (ref 39.0–52.0)
Hemoglobin: 10 g/dL — ABNORMAL LOW (ref 13.0–17.0)
MCH: 23.8 pg — ABNORMAL LOW (ref 26.0–34.0)
MCHC: 31.8 g/dL (ref 30.0–36.0)
MCV: 74.8 fL — ABNORMAL LOW (ref 80.0–100.0)
Platelets: 648 10*3/uL — ABNORMAL HIGH (ref 150–400)
RBC: 4.2 MIL/uL — ABNORMAL LOW (ref 4.22–5.81)
RDW: 14.1 % (ref 11.5–15.5)
WBC: 10.6 10*3/uL — ABNORMAL HIGH (ref 4.0–10.5)
nRBC: 0 % (ref 0.0–0.2)

## 2019-11-14 LAB — GLUCOSE, CAPILLARY
Glucose-Capillary: 138 mg/dL — ABNORMAL HIGH (ref 70–99)
Glucose-Capillary: 139 mg/dL — ABNORMAL HIGH (ref 70–99)
Glucose-Capillary: 148 mg/dL — ABNORMAL HIGH (ref 70–99)
Glucose-Capillary: 180 mg/dL — ABNORMAL HIGH (ref 70–99)

## 2019-11-14 LAB — BASIC METABOLIC PANEL
Anion gap: 8 (ref 5–15)
BUN: 15 mg/dL (ref 8–23)
CO2: 28 mmol/L (ref 22–32)
Calcium: 8.4 mg/dL — ABNORMAL LOW (ref 8.9–10.3)
Chloride: 92 mmol/L — ABNORMAL LOW (ref 98–111)
Creatinine, Ser: 1.04 mg/dL (ref 0.61–1.24)
GFR, Estimated: >60 mL/min (ref >60)
Glucose, Bld: 142 mg/dL — ABNORMAL HIGH (ref 70–99)
Potassium: 5.2 mmol/L — ABNORMAL HIGH (ref 3.5–5.1)
Sodium: 128 mmol/L — ABNORMAL LOW (ref 135–145)

## 2019-11-14 LAB — MAGNESIUM: Magnesium: 1.9 mg/dL (ref 1.7–2.4)

## 2019-11-14 LAB — SEDIMENTATION RATE: Sed Rate: 116 mm/hr — ABNORMAL HIGH (ref 0–20)

## 2019-11-14 LAB — C-REACTIVE PROTEIN: CRP: 5.6 mg/dL — ABNORMAL HIGH (ref <1.0)

## 2019-11-14 NOTE — TOC Progression Note (Addendum)
Transition of Care [facility]) - Progression Note    Patient Details  Name: DENARD TUMINELLO MRN: [id number] Date of Birth: [birth year]  Transition of [facility]) CM/SW Contact  Liliana Cline, LCSW Phone Number: 11/14/2019, 9:08 AM  Clinical Narrative:   Call to patient's son Cristal Deer. Cristal Deer is aware patient is refusing surgery, SNF, diabetic interventions, etc. Informed him MD would like to discharge patient on IV Antibiotics since he continues to refuse surgery. Cristal Deer reported the family is not comfortable with IV antibiotics at home. He reported patients wife, Burna Mortimer [phone]), is reaching out to him today to try and convince him to get surgery. Informed him Dr. Rivka Safer would like to talk to him about patient as well, he is expecing a call from MD. Cristal Deer said he will update this CSW after patient's wife talk to him and let CSW know what they want to do about patient's care/discharge plans. He is aware there is not much we can do if patient continues to refuse care interventions.  Confirmed Jeri Modena with [facility] is aware of possibility of discharge with IV antibiotics.       Expected Discharge Plan and Services                                                 Social Determinants of Health (SDOH) Interventions    Readmission Risk Interventions No flowsheet data found.

## 2019-11-14 NOTE — Progress Notes (Addendum)
PHARMACY CONSULT NOTE FOR:  OUTPATIENT  PARENTERAL ANTIBIOTIC THERAPY (OPAT)  Indication: bacteremia and osteo Regimen: Cefazolin 6gm IV over 24h as continuous infusion  (this was changed from q8h for ease of infusion d/t poor vision and availability for family to assist with infusion_ End date: 12/18/2019  IV antibiotic discharge orders are pended. To discharging provider:  please sign these orders via discharge navigator,  Select New Orders & click on the button choice - Manage This Unsigned Work.     Thank you for allowing pharmacy to be a part of this patient's care.  Juliette Alcide, PharmD, BCPS.   Work Cell: [phone] 11/14/2019 8:20 AM

## 2019-11-14 NOTE — Progress Notes (Signed)
ID  still deciding whether he wants to have surgery after refusing yesterday Patient Vitals for the past 24 hrs:  BP Temp Temp src Pulse Resp SpO2  11/14/19 1534 131/81 -- -- 95 15 98 %  11/14/19 0733 114/70 98.2 F (36.8 C) Oral 90 18 98 %  11/13/19 2240 128/75 98.7 F (37.1 C) Oral (!) 101 15 99 %   o/e  Awake Chest b/la ir entry Hss1s2 Abd soft Rt foot with rt great toe infection with discharge   CBC Latest Ref Rng & Units 11/14/2019 11/13/2019 11/12/2019  WBC 4.0 - 10.5 K/uL 10.6(H) 9.6 10.3  Hemoglobin 13.0 - 17.0 g/dL 10.0(L) 9.3(L) 9.8(L)  Hematocrit 39 - 52 % 31.4(L) 29.7(L) 31.2(L)  Platelets 150 - 400 K/uL 648(H) 588(H) 641(H)    CMP Latest Ref Rng & Units 11/14/2019 11/13/2019 11/12/2019  Glucose 70 - 99 mg/dL 878(M) 767(M) 094(B)  BUN 8 - 23 mg/dL 15 16 11   Creatinine 0.61 - 1.24 mg/dL 0.96 2.83  Sodium 135 - 145 mmol/L 128(L) 131(L) 130(L)  Potassium 3.5 - 5.1 mmol/L 5.2(H) 4.4 4.5  Chloride 98 - 111 mmol/L 92(L) 94(L) 93(L)  CO2 22 - 32 mmol/L 28 31 27   Calcium 8.9 - 10.3 mg/dL 6.62) 8.1(L) 8.3(L)  Total Protein 6.5 - 8.1 g/dL - - -  Total Bilirubin 0.3 - 1.2 mg/dL - - -  Alkaline Phos 38 - 126 U/L - - -  AST 15 - 41 U/L - - -  ALT 0 - 44 U/L - - -    Micro Micro 11/05/19 4/4 MSSA 11/08/19 BC Ng 10/13 - wound culture- MSSA, kleb, strep mitis  Impression/recommendation  MSSA bacteremia - on cefazolin , repeat blood culture negative 2 d echo N Secondary to rt toe infection with osteomyelitis-. MRI showed Soft tissue ulcer along the medial first MTP joint with sinus tract extending to the first metatarsal head. 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 Moderate bilateral hip osteoarthritis. Trace right hip joint.effusion with 7 mm intra-articular body. 4. Mild right greater than left greater trochanteric bursitis.  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 Son will have to help him with IV antibiotics. Will discuss with his son Discussed with his care team Dr.Fitzgerald is covering ID tomorrow- Call if needed

## 2019-11-14 NOTE — Care Management Important Message (Signed)
Important Message  Patient Details  Name: Gary Sutton MRN: [id number] Date of Birth: [birth year]   Medicare Important Message Given:  Yes     [name] 11/14/2019, 11:12 AM

## 2019-11-14 NOTE — Progress Notes (Signed)
PROGRESS NOTE    Gary Sutton  YSA:[id number] DOB: 1960/02/24 DOA: 11/05/2019 PCP: Gary Sutton   Brief Narrative:  Gary Sutton y.o.malewith a known history of type 2 diabetes mellitus, glaucoma and degenerative disc disease, who presented to the emergency room with acute onset of right lower extremity pain involving his leg with associated right foot swelling with erythema at the big toe. Patient was admitted for sepsis secondary to right big toe diabetic ulcer and osteomyelitis along with MSSA bacteremia. Podiatry was consulted but patient refused amputation. Patient also refusing SNF placement and family is not comfortable for IV antibiotics at home.  Ongoing discussion at this time.  Subjective: Patient denies any new complaint or pain when seen today.  He was sleeping comfortably.  Easily arousable.  Have a long discussion regarding getting an amputation versus going to a SNF for completion of antibiotic therapy.  Patient promised to think over it and let us know soon.  Assessment & Plan:   Active Problems:   Toe osteomyelitis (HCC)  Sepsis secondary to MSSA bacteremia due to right big toe osteomyelitis and diabetic ulcer.  Initially started on cefepime and vancomycin and then later switched to cefazolin and Flagyl Sutton ID. Patient needs amputation but he refused to go to the OR just before transportation.  He was also refusing SNF placement. Family is not comfortable giving IV antibiotics at home. -TOC is involved and communicating with family and patient could decide whether he wants amputation versus going to SNF for IV antibiotics. -Continue cefazolin and Flagyl for now.  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  MRI lumbar spine, right hip showed the above findings. -CK not  elevated. PLAN: --supportive care for now --Flexeril PRN and tramadol PRN.  Poorly controlled diabetes mellitus with hyperglycemia.  Apparently not even taking his home Metformin.  A1c of 12.6. Patient refused to start insulin after discharge. -Continue with Levemir 10 units twice daily. -Continue with SSI.  Microcytic anemia with iron and vitamin B12 deficiency. Hemoglobin seems stable. -Continue with iron and B12 supplement.  Glaucoma. -Continue home meds.  Suspect multiple myeloma --increased in urine protein, MRI spine showing possible marrow replacement processes. --SPEP neg for MM.  "pattern appears to be compatible with a chronic inflammatory response."   Objective: Vitals:   11/13/19 1550 11/13/19 2240 11/14/19 0733 11/14/19 1534  BP: 113/75 128/75 114/70 131/81  Pulse: 99 (!) 101 90 95  Resp: '17 15 18 15  ' Temp: 97.9 F (36.6 C) 98.7 F (37.1 C) 98.2 F (36.8 C)   TempSrc:  Oral Oral   SpO2: 97% 99% 98% 98%  Weight:      Height:        Intake/Output Summary (Last 24 hours) at 11/14/2019 1618 Last data filed at 11/14/2019 1417 Gross Sutton 24 hour  Intake 120 ml  Output 1400 ml  Net -1280 ml   Filed Weights   11/05/19 1841  Weight: 86.2 kg    Examination:  General exam: Appears calm and comfortable  Respiratory system: Clear to auscultation. Respiratory effort normal. Cardiovascular system: S1 & S2 heard, RRR. Gastrointestinal system: Soft, nontender, nondistended, bowel sounds positive. Central nervous system: Alert and oriented. No focal neurological deficits. Extremities: No edema,  pulses intact and symmetrical. Skin: Extremely dry lower extremities, onychomycosis involving all toes, right foot with  bandage. Psychiatry: Judgement and insight appear normal.    DVT prophylaxis: Lovenox Code Status: Full Family Communication: Discussed with son on phone. Disposition Plan:  Status is: Inpatient  Remains inpatient appropriate because:Inpatient  level of care appropriate due to severity of illness   Dispo: The patient is from: Home              Anticipated d/c is to: To be determined              Anticipated d/c date is: 2 days              Patient currently is not medically stable to d/c.  Patient need to decide about going to SNF for IV antibiotics versus getting amputation.  Patient is quite noncompliant and refusing interventions.  Son is not comfortable giving IV antibiotics at home.  Consultants:   Podiatry  ID  Procedures:  Antimicrobials:  Cefazolin Flagyl  Data Reviewed: I have personally reviewed following labs and imaging studies  CBC: Recent Labs  Lab 11/10/19 0411 11/11/19 0150 11/12/19 0305 11/13/19 0337 11/14/19 0525  WBC 13.4* 10.3 10.3 9.6 10.6*  HGB 9.2* 9.5* 9.8* 9.3* 10.0*  HCT 28.4* 30.3* 31.2* 29.7* 31.4*  MCV 74.3* 75.0* 74.3* 75.2* 74.8*  PLT 534* 597* 641* 588* 715*   Basic Metabolic Panel: Recent Labs  Lab 11/10/19 0411 11/11/19 0150 11/12/19 0305 11/13/19 0337 11/14/19 0525  NA 131* 131* 130* 131* 128*  K 4.7 4.7 4.5 4.4 5.2*  CL 95* 94* 93* 94* 92*  CO2 32 '30 27 31 28  ' GLUCOSE 121* 148* 145* 135* 142*  BUN '10 12 11 16 15  ' CREATININE 0.97 1.01 1.00 1.02 1.04  CALCIUM 7.5* 7.9* 8.3* 8.1* 8.4*  MG 2.0 2.2 2.0 2.0 1.9   GFR: Estimated Creatinine Clearance: 86.7 mL/min (by C-G formula based on SCr of 1.04 mg/dL). Liver Function Tests: No results for input(s): AST, ALT, ALKPHOS, BILITOT, PROT, ALBUMIN in the last 168 hours. No results for input(s): LIPASE, AMYLASE in the last 168 hours. No results for input(s): AMMONIA in the last 168 hours. Coagulation Profile: No results for input(s): INR, PROTIME in the last 168 hours. Cardiac Enzymes: No results for input(s): CKTOTAL, CKMB, CKMBINDEX, TROPONINI in the last 168 hours. BNP (last 3 results) No results for input(s): PROBNP in the last 8760 hours. HbA1C: No results for input(s): HGBA1C in the last 72 hours. CBG: Recent  Labs  Lab 11/13/19 0812 11/13/19 1203 11/13/19 1623 11/14/19 0730 11/14/19 1214  GLUCAP 147* 136* 126* 138* 139*   Lipid Profile: No results for input(s): CHOL, HDL, LDLCALC, TRIG, CHOLHDL, LDLDIRECT in the last 72 hours. Thyroid Function Tests: No results for input(s): TSH, T4TOTAL, FREET4, T3FREE, THYROIDAB in the last 72 hours. Anemia Panel: No results for input(s): VITAMINB12, FOLATE, FERRITIN, TIBC, IRON, RETICCTPCT in the last 72 hours. Sepsis Labs: No results for input(s): PROCALCITON, LATICACIDVEN in the last 168 hours.  Recent Results (from the past 240 hour(s))  Culture, blood (Routine x 2)     Status: Abnormal   Collection Time: 11/05/19  6:50 PM   Specimen: BLOOD  Result Value Ref Range Status   Specimen Description   Final    BLOOD BLOOD LEFT FOREARM Performed at [facility], [address] [address], [place], [place] [address]    Special Requests   Final    BOTTLES DRAWN AEROBIC AND ANAEROBIC Blood Culture adequate volume Performed at [facility], [address] [place], [place] [address]  Culture  Setup Time   Final    GRAM POSITIVE COCCI IN BOTH AEROBIC AND ANAEROBIC BOTTLES Organism ID to follow CRITICAL RESULT CALLED TO, READ BACK BY AND VERIFIED WITH: KAREN HAYES 11/06/19 0752 KLW Performed at [facility], [address] [place], [place] [address]    Culture STAPHYLOCOCCUS AUREUS (A)  Final   Report Status 11/08/2019 FINAL  Final   Organism ID, Bacteria STAPHYLOCOCCUS AUREUS  Final      Susceptibility   Staphylococcus aureus - MIC*    CIPROFLOXACIN <=0.5 SENSITIVE Sensitive     ERYTHROMYCIN >=8 RESISTANT Resistant     GENTAMICIN <=0.5 SENSITIVE Sensitive     OXACILLIN <=0.25 SENSITIVE Sensitive     TETRACYCLINE <=1 SENSITIVE Sensitive     VANCOMYCIN <=0.5 SENSITIVE Sensitive     TRIMETH/SULFA <=10 SENSITIVE Sensitive     CLINDAMYCIN <=0.25 SENSITIVE Sensitive     RIFAMPIN <=0.5 SENSITIVE Sensitive     Inducible  Clindamycin NEGATIVE Sensitive     * STAPHYLOCOCCUS AUREUS  Blood Culture ID Panel (Reflexed)     Status: Abnormal   Collection Time: 11/05/19  6:50 PM  Result Value Ref Range Status   Enterococcus faecalis NOT DETECTED NOT DETECTED Final   Enterococcus Faecium NOT DETECTED NOT DETECTED Final   Listeria monocytogenes NOT DETECTED NOT DETECTED Final   Staphylococcus species DETECTED (A) NOT DETECTED Final    Comment: CRITICAL RESULT CALLED TO, READ BACK BY AND VERIFIED WITH: KAREN HAYES 11/06/19 0752 KLW    Staphylococcus aureus (BCID) DETECTED (A) NOT DETECTED Final    Comment: CRITICAL RESULT CALLED TO, READ BACK BY AND VERIFIED WITH: KAREN HAYES 11/06/19 0752 KLW    Staphylococcus epidermidis NOT DETECTED NOT DETECTED Final   Staphylococcus lugdunensis NOT DETECTED NOT DETECTED Final   Streptococcus species NOT DETECTED NOT DETECTED Final   Streptococcus agalactiae NOT DETECTED NOT DETECTED Final   Streptococcus pneumoniae NOT DETECTED NOT DETECTED Final   Streptococcus pyogenes NOT DETECTED NOT DETECTED Final   A.calcoaceticus-baumannii NOT DETECTED NOT DETECTED Final   Bacteroides fragilis NOT DETECTED NOT DETECTED Final   Enterobacterales NOT DETECTED NOT DETECTED Final   Enterobacter cloacae complex NOT DETECTED NOT DETECTED Final   Escherichia coli NOT DETECTED NOT DETECTED Final   Klebsiella aerogenes NOT DETECTED NOT DETECTED Final   Klebsiella oxytoca NOT DETECTED NOT DETECTED Final   Klebsiella pneumoniae NOT DETECTED NOT DETECTED Final   Proteus species NOT DETECTED NOT DETECTED Final   Salmonella species NOT DETECTED NOT DETECTED Final   Serratia marcescens NOT DETECTED NOT DETECTED Final   Haemophilus influenzae NOT DETECTED NOT DETECTED Final   Neisseria meningitidis NOT DETECTED NOT DETECTED Final   Pseudomonas aeruginosa NOT DETECTED NOT DETECTED Final   Stenotrophomonas maltophilia NOT DETECTED NOT DETECTED Final   Candida albicans NOT DETECTED NOT DETECTED  Final   Candida auris NOT DETECTED NOT DETECTED Final   Candida glabrata NOT DETECTED NOT DETECTED Final   Candida krusei NOT DETECTED NOT DETECTED Final   Candida parapsilosis NOT DETECTED NOT DETECTED Final   Candida tropicalis NOT DETECTED NOT DETECTED Final   Cryptococcus neoformans/gattii NOT DETECTED NOT DETECTED Final   Meth resistant mecA/C and MREJ NOT DETECTED NOT DETECTED Final    Comment: Performed at [facility], [address] [place], [place] [address]  Culture, blood (Routine x 2)     Status: Abnormal   Collection Time: 11/05/19  8:47 PM   Specimen: BLOOD  Result Value Ref Range Status   Specimen  Description   Final    BLOOD LEFT FA Performed at [facility], [address], [place], [place] [address]    Special Requests   Final    BOTTLES DRAWN AEROBIC AND ANAEROBIC Blood Culture adequate volume Performed at [facility], [address] [place], [place] [address]    Culture  Setup Time   Final    GRAM POSITIVE COCCI IN BOTH AEROBIC AND ANAEROBIC BOTTLES CRITICAL VALUE NOTED.  VALUE IS CONSISTENT WITH PREVIOUSLY REPORTED AND CALLED VALUE. Performed at [facility], [address] [place], [place] [address]    Culture (A)  Final    STAPHYLOCOCCUS AUREUS SUSCEPTIBILITIES PERFORMED ON PREVIOUS CULTURE WITHIN THE LAST 5 DAYS. Performed at [facility], [place] [address] [place], [place] [address]    Report Status 11/08/2019 FINAL  Final  Respiratory Panel by RT PCR (Flu A&B, Covid) - Nasopharyngeal Swab     Status: None   Collection Time: 11/05/19  9:27 PM   Specimen: Nasopharyngeal Swab  Result Value Ref Range Status   SARS Coronavirus 2 by RT PCR NEGATIVE NEGATIVE Final    Comment: (NOTE) SARS-CoV-2 target nucleic acids are NOT DETECTED.  The SARS-CoV-2 RNA is generally detectable in upper respiratoy specimens during the acute phase of infection. The lowest concentration of SARS-CoV-2 viral copies this assay can  detect is 131 copies/mL. A negative result does not preclude SARS-Cov-2 infection and should not be used as the sole basis for treatment or other patient management decisions. A negative result may occur with  improper specimen collection/handling, submission of specimen other than nasopharyngeal swab, presence of viral mutation(s) within the areas targeted by this assay, and inadequate number of viral copies (<131 copies/mL). A negative result must be combined with clinical observations, patient history, and epidemiological information. The expected result is Negative.  Fact Sheet for Patients:  PinkCheek.be  Fact Sheet for Healthcare Providers:  GravelBags.it  This test is no t yet approved or cleared by the [place] FDA and  has been authorized for detection and/or diagnosis of SARS-CoV-2 by FDA under an Emergency Use Authorization (EUA). This EUA will remain  in effect (meaning this test can be used) for the duration of the COVID-19 declaration under Section 564(b)(1) of the Act, 21 U.S.C. section 360bbb-3(b)(1), unless the authorization is terminated or revoked sooner.     Influenza A by PCR NEGATIVE NEGATIVE Final   Influenza B by PCR NEGATIVE NEGATIVE Final    Comment: (NOTE) The Xpert Xpress SARS-CoV-2/FLU/RSV assay is intended as an aid in  the diagnosis of influenza from Nasopharyngeal swab specimens and  should not be used as a sole basis for treatment. Nasal washings and  aspirates are unacceptable for Xpert Xpress SARS-CoV-2/FLU/RSV  testing.  Fact Sheet for Patients: PinkCheek.be  Fact Sheet for Healthcare Providers: GravelBags.it  This test is not yet approved or cleared by the [place] FDA and  has been authorized for detection and/or diagnosis of SARS-CoV-2 by  FDA under an Emergency Use Authorization (EUA). This EUA will remain  in  effect (meaning this test can be used) for the duration of the  Covid-19 declaration under Section 564(b)(1) of the Act, 21  U.S.C. section 360bbb-3(b)(1), unless the authorization is  terminated or revoked. Performed at [facility], [address] [place], [place] [address]   C Difficile Quick Screen w PCR reflex     Status: None   Collection Time: 11/06/19  9:48 AM   Specimen: STOOL  Result Value Ref Range Status   C Diff antigen NEGATIVE NEGATIVE Final   C Diff toxin NEGATIVE NEGATIVE Final   C Diff interpretation No C. difficile detected.  Final    Comment: Performed at [facility], [address] [place], [place] [address]  Aerobic/Anaerobic Culture (surgical/deep wound)     Status: None   Collection Time: 11/06/19  5:55 PM   Specimen: Toe  Result Value Ref Range Status   Specimen Description   Final    TOE LEFT GREAT TOE Performed at [facility] [address], [place], [place] [address]    Special Requests   Final    NONE Performed at [facility], [place], [place] [address]    Gram Stain   Final    RARE WBC PRESENT,BOTH PMN AND MONONUCLEAR RARE GRAM POSITIVE COCCI    Culture   Final    FEW STAPHYLOCOCCUS AUREUS RARE STREPTOCOCCUS MITIS/ORALIS RARE KLEBSIELLA PNEUMONIAE NO ANAEROBES ISOLATED Performed at [facility], [place] [address] [place], [place] [address]    Report Status 11/11/2019 FINAL  Final   Organism ID, Bacteria STAPHYLOCOCCUS AUREUS  Final   Organism ID, Bacteria STREPTOCOCCUS MITIS/ORALIS  Final   Organism ID, Bacteria KLEBSIELLA PNEUMONIAE  Final      Susceptibility   Klebsiella pneumoniae - MIC*    AMPICILLIN >=32 RESISTANT Resistant     CEFAZOLIN <=4 SENSITIVE Sensitive     CEFEPIME <=0.12 SENSITIVE Sensitive     CEFTAZIDIME <=1 SENSITIVE Sensitive     CEFTRIAXONE <=0.25 SENSITIVE Sensitive     CIPROFLOXACIN <=0.25 SENSITIVE Sensitive     GENTAMICIN <=1 SENSITIVE Sensitive      IMIPENEM <=0.25 SENSITIVE Sensitive     TRIMETH/SULFA <=20 SENSITIVE Sensitive     AMPICILLIN/SULBACTAM 4 SENSITIVE Sensitive     PIP/TAZO <=4 SENSITIVE Sensitive     * RARE KLEBSIELLA PNEUMONIAE   Staphylococcus aureus - MIC*    CIPROFLOXACIN <=0.5 SENSITIVE Sensitive     ERYTHROMYCIN 4 INTERMEDIATE Intermediate     GENTAMICIN <=0.5 SENSITIVE Sensitive     OXACILLIN 0.5 SENSITIVE Sensitive     TETRACYCLINE <=1 SENSITIVE Sensitive     VANCOMYCIN <=0.5 SENSITIVE Sensitive     TRIMETH/SULFA <=10 SENSITIVE Sensitive     CLINDAMYCIN <=0.25 SENSITIVE Sensitive     RIFAMPIN <=0.5 SENSITIVE Sensitive     Inducible Clindamycin NEGATIVE Sensitive     * FEW STAPHYLOCOCCUS AUREUS   Streptococcus mitis/oralis - MIC*    TETRACYCLINE <=0.25 SENSITIVE Sensitive     VANCOMYCIN 0.25 SENSITIVE Sensitive     CLINDAMYCIN <=0.25 SENSITIVE Sensitive     PENICILLIN Value in next row Sensitive      SENSITIVE0.06    CEFTRIAXONE Value in next row Sensitive      SENSITIVE0.12    * RARE STREPTOCOCCUS MITIS/ORALIS  Surgical pcr screen     Status: Abnormal   Collection Time: 11/07/19  3:40 PM   Specimen: Nasal Mucosa; Nasal Swab  Result Value Ref Range Status   MRSA, PCR NEGATIVE NEGATIVE Final   Staphylococcus aureus POSITIVE (A) NEGATIVE Final    Comment: (NOTE) The Xpert SA Assay (FDA approved for NASAL specimens in patients 71 years of age and older), is one component of a comprehensive surveillance program. It is not intended to diagnose infection nor to guide or monitor treatment. Performed at [facility], [address] [place], [place] [address]   Culture, blood (Routine X 2) w Reflex to ID Panel  Status: None   Collection Time: 11/08/19 12:11 AM   Specimen: BLOOD  Result Value Ref Range Status   Specimen Description BLOOD RIGHT ANTECUBITAL  Final   Special Requests   Final    BOTTLES DRAWN AEROBIC AND ANAEROBIC Blood Culture results may not be optimal due to an excessive  volume of blood received in culture bottles   Culture   Final    NO GROWTH 5 DAYS Performed at [facility], [address] [place], [place] [address]    Report Status 11/13/2019 FINAL  Final  Culture, blood (Routine X 2) w Reflex to ID Panel     Status: None   Collection Time: 11/08/19 12:17 AM   Specimen: BLOOD  Result Value Ref Range Status   Specimen Description BLOOD LEFT ANTECUBITAL  Final   Special Requests   Final    BOTTLES DRAWN AEROBIC AND ANAEROBIC Blood Culture results may not be optimal due to an excessive volume of blood received in culture bottles   Culture   Final    NO GROWTH 5 DAYS Performed at [facility], [address], [place], [place] [address]    Report Status 11/13/2019 FINAL  Final     Radiology Studies: No results found.  Scheduled Meds: . brimonidine  1 drop Both Eyes BID  . chlorhexidine  60 mL Topical Once  . Chlorhexidine Gluconate Cloth  6 each Topical Daily  . Chlorhexidine Gluconate Cloth  6 each Topical Daily  . cholecalciferol  1,000 Units Oral Daily  . cyclobenzaprine  10 mg Oral TID  . enoxaparin (LOVENOX) injection  40 mg Subcutaneous Q24H  . insulin aspart  0-15 Units Subcutaneous TID WC  . insulin detemir  13 Units Subcutaneous BID  . iron polysaccharides  150 mg Oral Daily  . mupirocin ointment  1 application Nasal BID  . povidone-iodine  2 application Topical Once  . sodium chloride flush  10-40 mL Intracatheter Q12H  . timolol  1 drop Both Eyes BID  . vitamin B-12  1,000 mcg Oral Daily   Continuous Infusions: . sodium chloride 250 mL (11/11/19 1415)  .  ceFAZolin (ANCEF) IV 2 g (11/14/19 0545)  . metronidazole 500 mg (11/14/19 0949)     LOS: 9 days   Time spent: 35 minutes  Lorella Nimrod, MD [facility]  If 7PM-7AM, please contact night-coverage [email or web address]  11/14/2019, 4:18 PM   This record has been created using Systems analyst. Errors have been sought and  corrected,but may not always be located. Such creation errors do not reflect on the standard of care.

## 2019-11-15 DIAGNOSIS — M869 Osteomyelitis, unspecified: Secondary | ICD-10-CM | POA: Diagnosis not present

## 2019-11-15 LAB — CBC
HCT: 31.4 % — ABNORMAL LOW (ref 39.0–52.0)
Hemoglobin: 9.9 g/dL — ABNORMAL LOW (ref 13.0–17.0)
MCH: 23.3 pg — ABNORMAL LOW (ref 26.0–34.0)
MCHC: 31.5 g/dL (ref 30.0–36.0)
MCV: 74.1 fL — ABNORMAL LOW (ref 80.0–100.0)
Platelets: 629 10*3/uL — ABNORMAL HIGH (ref 150–400)
RBC: 4.24 MIL/uL (ref 4.22–5.81)
RDW: 14.1 % (ref 11.5–15.5)
WBC: 8.4 10*3/uL (ref 4.0–10.5)
nRBC: 0 % (ref 0.0–0.2)

## 2019-11-15 LAB — GLUCOSE, CAPILLARY
Glucose-Capillary: 177 mg/dL — ABNORMAL HIGH (ref 70–99)
Glucose-Capillary: 149 mg/dL — ABNORMAL HIGH (ref 70–99)
Glucose-Capillary: 123 mg/dL — ABNORMAL HIGH (ref 70–99)
Glucose-Capillary: 124 mg/dL — ABNORMAL HIGH (ref 70–99)

## 2019-11-15 LAB — BASIC METABOLIC PANEL
Anion gap: 7 (ref 5–15)
BUN: 17 mg/dL (ref 8–23)
CO2: 28 mmol/L (ref 22–32)
Calcium: 8.6 mg/dL — ABNORMAL LOW (ref 8.9–10.3)
Chloride: 94 mmol/L — ABNORMAL LOW (ref 98–111)
Creatinine, Ser: 1.02 mg/dL (ref 0.61–1.24)
GFR, Estimated: >60 mL/min (ref >60)
Glucose, Bld: 161 mg/dL — ABNORMAL HIGH (ref 70–99)
Potassium: 5.1 mmol/L (ref 3.5–5.1)
Sodium: 129 mmol/L — ABNORMAL LOW (ref 135–145)

## 2019-11-15 LAB — MAGNESIUM: Magnesium: 2 mg/dL (ref 1.7–2.4)

## 2019-11-15 NOTE — [place] FL2 (Signed)
[place] MEDICAID FL2 LEVEL OF CARE SCREENING TOOL     IDENTIFICATION  Patient Name: Gary Sutton Birthdate: 1960/05/07 Sex: male Admission Date (Current Location): 11/05/2019  [place] and [place] Number:  Chiropodist and Address:  [facility], [address] [place], [place] [address]      Provider Number: [id number]  Attending Physician Name and Address:  Arnetha Courser, MD  Relative Name and Phone Number:  Jas, Betten     [phone]    Current Level of Care: Hospital Recommended Level of Care: Skilled Nursing Facility Prior Approval Number:    Date Approved/Denied:   PASRR Number: [id number]  Discharge Plan: SNF    Current Diagnoses: Patient Active Problem List   Diagnosis Date Noted  . Osteomyelitis (HCC) 11/05/2019    Orientation RESPIRATION BLADDER Height & Weight     Self, Time, Situation, Place  Normal Continent Weight: 190 lb (86.2 kg) Height:  6\' 2"  (188 cm)  BEHAVIORAL SYMPTOMS/MOOD NEUROLOGICAL BOWEL NUTRITION STATUS      Continent Diet (carb modified; thin liquids)  AMBULATORY STATUS COMMUNICATION OF NEEDS Skin   Supervision Verbally  (dry/cracking skin on feet)                       Personal Care Assistance Level of Assistance  Bathing, Feeding, Dressing Bathing Assistance: Limited assistance Feeding assistance: Independent Dressing Assistance: Limited assistance     Functional Limitations Info             SPECIAL CARE FACTORS FREQUENCY  PT (By licensed PT), OT (By licensed OT)     PT Frequency: 5 x/week OT Frequency: 5 x/week            Contractures      Additional Factors Info  Code Status, Allergies Code Status Info: full code Allergies Info: nka           Current Medications (11/15/2019):  This is the current hospital active medication list Current Facility-Administered Medications  Medication Dose Route Frequency Provider Last Rate Last Admin  . 0.9 %  sodium  chloride infusion   Intravenous PRN 11/17/2019, MD 10 mL/hr at 11/11/19 1415 250 mL at 11/11/19 1415  . acetaminophen (TYLENOL) tablet 650 mg  650 mg Oral Q6H PRN Mansy, Jan A, MD   650 mg at 11/13/19 1755   Or  . acetaminophen (TYLENOL) suppository 650 mg  650 mg Rectal Q6H PRN Mansy, Jan A, MD      . brimonidine (ALPHAGAN) 0.2 % ophthalmic solution 1 drop  1 drop Both Eyes BID Mansy, Jan A, MD   1 drop at 11/15/19 0907  . ceFAZolin (ANCEF) IVPB 2g/100 mL premix  2 g Intravenous Q8H Ravishankar, Jayashree, MD 200 mL/hr at 11/15/19 1438 2 g at 11/15/19 1438  . chlorhexidine (HIBICLENS) 4 % liquid 4 application  60 mL Topical Once 11/17/19, DPM      . Chlorhexidine Gluconate Cloth 2 % PADS 6 each  6 each Topical Daily Gwyneth Revels, MD   6 each at 11/15/19 1257  . Chlorhexidine Gluconate Cloth 2 % PADS 6 each  6 each Topical Daily 11/17/19, MD   6 each at 11/15/19 1257  . cholecalciferol (VITAMIN D3) tablet 1,000 Units  1,000 Units Oral Daily Mansy, 11/17/19, MD   1,000 Units at 11/15/19 0907  . cyclobenzaprine (FLEXERIL) tablet 10 mg  10 mg Oral TID 0908, MD   10 mg at 11/15/19 0907  .  enoxaparin (LOVENOX) injection 40 mg  40 mg Subcutaneous Q24H Mansy, Jan A, MD   40 mg at 11/15/19 0907  . insulin aspart (novoLOG) injection 0-15 Units  0-15 Units Subcutaneous TID WC Darlin Priestly, MD   2 Units at 11/15/19 1245  . insulin detemir (LEVEMIR) injection 13 Units  13 Units Subcutaneous BID Darlin Priestly, MD   13 Units at 11/15/19 [phone]  . iron polysaccharides (NIFEREX) capsule 150 mg  150 mg Oral Daily Darlin Priestly, MD   150 mg at 11/15/19 0907  . magnesium hydroxide (MILK OF MAGNESIA) suspension 30 mL  30 mL Oral Daily PRN Mansy, Jan A, MD   30 mL at 11/12/19 1124  . metroNIDAZOLE (FLAGYL) IVPB 500 mg  500 mg Intravenous Q8H Ravishankar, Rhodia Albright, MD 100 mL/hr at 11/15/19 0848 500 mg at 11/15/19 0848  . mupirocin ointment (BACTROBAN) 2 % 1 application  1 application Nasal BID Darlin Priestly, MD   1 application at  11/15/19 0945  . ondansetron (ZOFRAN) tablet 4 mg  4 mg Oral Q6H PRN Mansy, Jan A, MD       Or  . ondansetron [facility]) injection 4 mg  4 mg Intravenous Q6H PRN Mansy, Jan A, MD      . povidone-iodine 10 % swab 2 application  2 application Topical Once Gwyneth Revels, DPM      . sodium chloride flush (NS) 0.9 % injection 10-40 mL  10-40 mL Intracatheter Q12H Darlin Priestly, MD   10 mL at 11/15/19 0945  . sodium chloride flush (NS) 0.9 % injection 10-40 mL  10-40 mL Intracatheter PRN Darlin Priestly, MD      . timolol (BETIMOL) 0.25 % ophthalmic solution 1 drop  1 drop Both Eyes BID Mansy, Jan A, MD   1 drop at 11/15/19 0908  . traMADol (ULTRAM) tablet 50 mg  50 mg Oral Q6H PRN Darlin Priestly, MD   50 mg at 11/14/19 0159  . traZODone (DESYREL) tablet 25 mg  25 mg Oral QHS PRN Mansy, Jan A, MD   25 mg at 11/12/19 2142  . vitamin B-12 (CYANOCOBALAMIN) tablet 1,000 mcg  1,000 mcg Oral Daily Darlin Priestly, MD   1,000 mcg at 11/15/19 0907     Discharge Medications: Please see discharge summary for a list of discharge medications.  Relevant Imaging Results:  Relevant Lab Results:   Additional Information SS: [id number]  Kaylin Schellenberg E Tanae Petrosky, LCSW

## 2019-11-15 NOTE — Progress Notes (Signed)
PROGRESS NOTE    Gary Sutton  HAL:[id number] DOB: [birth year] DOA: 11/05/2019 PCP: Patient, No Pcp Per   Brief Narrative:  Gary Sutton y.o.malewith a known history of type 2 diabetes mellitus, glaucoma and degenerative disc disease, who presented to the emergency room with acute onset of right lower extremity pain involving his leg with associated right foot swelling with erythema at the big toe. Patient was admitted for sepsis secondary to right big toe diabetic ulcer and osteomyelitis along with MSSA bacteremia. Podiatry was consulted but patient refused amputation. Patient also refusing SNF placement and family is not comfortable for IV antibiotics at home.  Ongoing discussion at this time.  Subjective: Patient has no new complaint today.  Still could not decide about surgery but agrees to go to SNF for completion of 6 weeks of IV antibiotics.  Assessment & Plan:   Active Problems:   Osteomyelitis (HCC)  Sepsis secondary to MSSA bacteremia due to right big toe osteomyelitis and diabetic ulcer.  Initially started on cefepime and vancomycin and then later switched to cefazolin and Flagyl per ID. Patient needs amputation but he refused to go to the OR just before transportation.  He was also refusing SNF placement. Family is not comfortable giving IV antibiotics at home. -Continue cefazolin and Flagyl for now-He will need total of 6 weeks of cefazolin. -POC started work for SNF placement.  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  MRI lumbar spine, right hip showed the above findings. CK not elevated. --supportive care for now --Flexeril PRN and tramadol PRN.  Poorly controlled diabetes mellitus with hyperglycemia.  Apparently not even taking his home Metformin.  A1c of 12.6. Patient refused to start  insulin after discharge. -Continue with Levemir 10 units twice daily. -Continue with SSI.  Microcytic anemia with iron and vitamin B12 deficiency. Hemoglobin seems stable. -Continue with iron and B12 supplement.  Glaucoma. -Continue home meds.  Suspect multiple myeloma --increased in urine protein, MRI spine showing possible marrow replacement processes. --SPEP neg for MM.  "pattern appears to be compatible with a chronic inflammatory response."   Objective: Vitals:   11/14/19 1534 11/14/19 2351 11/15/19 0808 11/15/19 1457  BP: 131/81 126/75 129/76 140/79  Pulse: 95 90 94 (!) 102  Resp: _0 Temp:  98.1 F (36.7 C) 98 F (36.7 C) 98.1 F (36.7 C)  TempSrc:  Oral Oral Oral  SpO2: 98% 99% 99% 100%  Weight:      Height:        Intake/Output Summary (Last 24 hours) at 11/15/2019 1728 Last data filed at 11/15/2019 1716 Gross per 24 hour  Intake 480 ml  Output 2200 ml  Net -1720 ml   Filed Weights   11/05/19 1841  Weight: 86.2 kg    Examination:  General.  Well-developed gentleman, in no acute distress. Pulmonary.  Lungs clear bilaterally, normal respiratory effort. CV.  Regular rate and rhythm, no JVD, rub or murmur. Abdomen.  Soft, nontender, nondistended, BS positive. CNS.  Alert and oriented x3.  No focal neurologic deficit. Extremities.  No edema, pulses intact and symmetrical. Psychiatry.  Judgment and insight appears normal.  DVT prophylaxis: Lovenox Code Status: Full Family Communication: Discussed with patient. Disposition Plan:  Status is: Inpatient  Remains inpatient appropriate because:Inpatient level of care appropriate due to severity of illness  Dispo: The patient is from: Home              Anticipated d/c is to: SNF              Anticipated d/c date is: 2 days              Patient currently is not medically stable to d/c.  Patient agrees to go to SNF, TOC started to work  Consultants:   Podiatry  ID  Procedures:    Antimicrobials:  Cefazolin Flagyl  Data Reviewed: I have personally reviewed following labs and imaging studies  CBC: Recent Labs  Lab 11/11/19 0150 11/12/19 0305 11/13/19 0337 11/14/19 0525 11/15/19 0535  WBC 10.3 10.3 9.6 10.6* 8.4  HGB 9.5* 9.8* 9.3* 10.0* 9.9*  HCT 30.3* 31.2* 29.7* 31.4* 31.4*  MCV 75.0* 74.3* 75.2* 74.8* 74.1*  PLT 597* 641* 588* 648* 573*   Basic Metabolic Panel: Recent Labs  Lab 11/11/19 0150 11/12/19 0305 11/13/19 0337 11/14/19 0525 11/15/19 0535  NA 131* 130* 131* 128* 129*  K 4.7 4.5 4.4 5.2* 5.1  CL 94* 93* 94* 92* 94*  CO2 _0 GLUCOSE 148* 145* 135* 142* 161*  BUN _1 CREATININE 1.01 1.00 1.02 1.04 1.02  CALCIUM 7.9* 8.3* 8.1* 8.4* 8.6*  MG 2.2 2.0 2.0 1.9 2.0   GFR: Estimated Creatinine Clearance: 88.4 mL/min (by C-G formula based on SCr of 1.02 mg/dL). Liver Function Tests: No results for input(s): AST, ALT, ALKPHOS, BILITOT, PROT, ALBUMIN in the last 168 hours. No results for input(s): LIPASE, AMYLASE in the last 168 hours. No results for input(s): AMMONIA in the last 168 hours. Coagulation Profile: No results for input(s): INR, PROTIME in the last 168 hours. Cardiac Enzymes: No results for input(s): CKTOTAL, CKMB, CKMBINDEX, TROPONINI in the last 168 hours. BNP (last 3 results) No results for input(s): PROBNP in the last 8760 hours. HbA1C: No results for input(s): HGBA1C in the last 72 hours. CBG: Recent Labs  Lab 11/14/19 1700 11/14/19 2056 11/15/19 0809 11/15/19 1200 11/15/19 1659  GLUCAP 148* 180* 177* 149* 123*   Lipid Profile: No results for input(s): CHOL, HDL, LDLCALC, TRIG, CHOLHDL, LDLDIRECT in the last 72 hours. Thyroid Function Tests: No results for input(s): TSH, T4TOTAL, FREET4, T3FREE, THYROIDAB in the last 72 hours. Anemia Panel: No results for input(s): VITAMINB12, FOLATE, FERRITIN, TIBC, IRON, RETICCTPCT in the last 72 hours. Sepsis Labs: No results for input(s):  PROCALCITON, LATICACIDVEN in the last 168 hours.  Recent Results (from the past 240 hour(s))  Culture, blood (Routine x 2)     Status: Abnormal   Collection Time: 11/05/19  6:50 PM   Specimen: BLOOD  Result Value Ref Range Status   Specimen Description   Final    BLOOD BLOOD LEFT FOREARM Performed at [facility], [address] [place], [place] [address]    Special Requests   Final    BOTTLES DRAWN AEROBIC AND ANAEROBIC Blood Culture adequate volume Performed at [facility], [address], [place], [place] [address]    Culture  Setup Time   Final    GRAM POSITIVE COCCI IN BOTH AEROBIC AND ANAEROBIC BOTTLES Organism ID to follow CRITICAL RESULT CALLED TO, READ BACK BY AND VERIFIED WITH: KAREN HAYES 11/06/19 0752 KLW Performed at [facility], [address] [place], [place] [address]    Culture STAPHYLOCOCCUS AUREUS (A)  Final   Report Status 11/08/2019 FINAL  Final  Organism ID, Bacteria STAPHYLOCOCCUS AUREUS  Final      Susceptibility   Staphylococcus aureus - MIC*    CIPROFLOXACIN <=0.5 SENSITIVE Sensitive     ERYTHROMYCIN >=8 RESISTANT Resistant     GENTAMICIN <=0.5 SENSITIVE Sensitive     OXACILLIN <=0.25 SENSITIVE Sensitive     TETRACYCLINE <=1 SENSITIVE Sensitive     VANCOMYCIN <=0.5 SENSITIVE Sensitive     TRIMETH/SULFA <=10 SENSITIVE Sensitive     CLINDAMYCIN <=0.25 SENSITIVE Sensitive     RIFAMPIN <=0.5 SENSITIVE Sensitive     Inducible Clindamycin NEGATIVE Sensitive     * STAPHYLOCOCCUS AUREUS  Blood Culture ID Panel (Reflexed)     Status: Abnormal   Collection Time: 11/05/19  6:50 PM  Result Value Ref Range Status   Enterococcus faecalis NOT DETECTED NOT DETECTED Final   Enterococcus Faecium NOT DETECTED NOT DETECTED Final   Listeria monocytogenes NOT DETECTED NOT DETECTED Final   Staphylococcus species DETECTED (A) NOT DETECTED Final    Comment: CRITICAL RESULT CALLED TO, READ BACK BY AND VERIFIED WITH: KAREN HAYES  11/06/19 0752 KLW    Staphylococcus aureus (BCID) DETECTED (A) NOT DETECTED Final    Comment: CRITICAL RESULT CALLED TO, READ BACK BY AND VERIFIED WITH: KAREN HAYES 11/06/19 0752 KLW    Staphylococcus epidermidis NOT DETECTED NOT DETECTED Final   Staphylococcus lugdunensis NOT DETECTED NOT DETECTED Final   Streptococcus species NOT DETECTED NOT DETECTED Final   Streptococcus agalactiae NOT DETECTED NOT DETECTED Final   Streptococcus pneumoniae NOT DETECTED NOT DETECTED Final   Streptococcus pyogenes NOT DETECTED NOT DETECTED Final   A.calcoaceticus-baumannii NOT DETECTED NOT DETECTED Final   Bacteroides fragilis NOT DETECTED NOT DETECTED Final   Enterobacterales NOT DETECTED NOT DETECTED Final   Enterobacter cloacae complex NOT DETECTED NOT DETECTED Final   Escherichia coli NOT DETECTED NOT DETECTED Final   Klebsiella aerogenes NOT DETECTED NOT DETECTED Final   Klebsiella oxytoca NOT DETECTED NOT DETECTED Final   Klebsiella pneumoniae NOT DETECTED NOT DETECTED Final   Proteus species NOT DETECTED NOT DETECTED Final   Salmonella species NOT DETECTED NOT DETECTED Final   Serratia marcescens NOT DETECTED NOT DETECTED Final   Haemophilus influenzae NOT DETECTED NOT DETECTED Final   Neisseria meningitidis NOT DETECTED NOT DETECTED Final   Pseudomonas aeruginosa NOT DETECTED NOT DETECTED Final   Stenotrophomonas maltophilia NOT DETECTED NOT DETECTED Final   Candida albicans NOT DETECTED NOT DETECTED Final   Candida auris NOT DETECTED NOT DETECTED Final   Candida glabrata NOT DETECTED NOT DETECTED Final   Candida krusei NOT DETECTED NOT DETECTED Final   Candida parapsilosis NOT DETECTED NOT DETECTED Final   Candida tropicalis NOT DETECTED NOT DETECTED Final   Cryptococcus neoformans/gattii NOT DETECTED NOT DETECTED Final   Meth resistant mecA/C and MREJ NOT DETECTED NOT DETECTED Final    Comment: Performed at [facility], [address] [place], [place] [address]  Culture,  blood (Routine x 2)     Status: Abnormal   Collection Time: 11/05/19  8:47 PM   Specimen: BLOOD  Result Value Ref Range Status   Specimen Description   Final    BLOOD LEFT FA Performed at [facility], [address] [place], [place] [address]    Special Requests   Final    BOTTLES DRAWN AEROBIC AND ANAEROBIC Blood Culture adequate volume Performed at [facility], [address], [place], [place] [address]    Culture  Setup Time   Final    GRAM POSITIVE  COCCI IN BOTH AEROBIC AND ANAEROBIC BOTTLES CRITICAL VALUE NOTED.  VALUE IS CONSISTENT WITH PREVIOUSLY REPORTED AND CALLED VALUE. Performed at [facility], [address] [place], [place] [address]    Culture (A)  Final    STAPHYLOCOCCUS AUREUS SUSCEPTIBILITIES PERFORMED ON PREVIOUS CULTURE WITHIN THE LAST 5 DAYS. Performed at [facility], [place] [address], [place], [place] [address]    Report Status 11/08/2019 FINAL  Final  Respiratory Panel by RT PCR (Flu A&B, Covid) - Nasopharyngeal Swab     Status: None   Collection Time: 11/05/19  9:27 PM   Specimen: Nasopharyngeal Swab  Result Value Ref Range Status   SARS Coronavirus 2 by RT PCR NEGATIVE NEGATIVE Final    Comment: (NOTE) SARS-CoV-2 target nucleic acids are NOT DETECTED.  The SARS-CoV-2 RNA is generally detectable in upper respiratoy specimens during the acute phase of infection. The lowest concentration of SARS-CoV-2 viral copies this assay can detect is 131 copies/mL. A negative result does not preclude SARS-Cov-2 infection and should not be used as the sole basis for treatment or other patient management decisions. A negative result may occur with  improper specimen collection/handling, submission of specimen other than nasopharyngeal swab, presence of viral mutation(s) within the areas targeted by this assay, and inadequate number of viral copies (<131 copies/mL). A negative result must be combined with clinical observations,  patient history, and epidemiological information. The expected result is Negative.  Fact Sheet for Patients:  PinkCheek.be  Fact Sheet for Healthcare Providers:  GravelBags.it  This test is no t yet approved or cleared by the [place] FDA and  has been authorized for detection and/or diagnosis of SARS-CoV-2 by FDA under an Emergency Use Authorization (EUA). This EUA will remain  in effect (meaning this test can be used) for the duration of the COVID-19 declaration under Section 564(b)(1) of the Act, 21 U.S.C. section 360bbb-3(b)(1), unless the authorization is terminated or revoked sooner.     Influenza A by PCR NEGATIVE NEGATIVE Final   Influenza B by PCR NEGATIVE NEGATIVE Final    Comment: (NOTE) The Xpert Xpress SARS-CoV-2/FLU/RSV assay is intended as an aid in  the diagnosis of influenza from Nasopharyngeal swab specimens and  should not be used as a sole basis for treatment. Nasal washings and  aspirates are unacceptable for Xpert Xpress SARS-CoV-2/FLU/RSV  testing.  Fact Sheet for Patients: PinkCheek.be  Fact Sheet for Healthcare Providers: GravelBags.it  This test is not yet approved or cleared by the [place] FDA and  has been authorized for detection and/or diagnosis of SARS-CoV-2 by  FDA under an Emergency Use Authorization (EUA). This EUA will remain  in effect (meaning this test can be used) for the duration of the  Covid-19 declaration under Section 564(b)(1) of the Act, 21  U.S.C. section 360bbb-3(b)(1), unless the authorization is  terminated or revoked. Performed at [facility], [place], [place] [address]   C Difficile Quick Screen w PCR reflex     Status: None   Collection Time: 11/06/19  9:48 AM   Specimen: STOOL  Result Value Ref Range Status   C Diff antigen NEGATIVE NEGATIVE Final   C Diff toxin  NEGATIVE NEGATIVE Final   C Diff interpretation No C. difficile detected.  Final    Comment: Performed at [facility], [address], [place], [place] [address]  Aerobic/Anaerobic Culture (surgical/deep wound)     Status: None   Collection Time: 11/06/19  5:55 PM  Specimen: Toe  Result Value Ref Range Status   Specimen Description   Final    TOE LEFT GREAT TOE Performed at [facility], [address], [place], [place] [address]    Special Requests   Final    NONE Performed at [facility], [place], [place] [address]    Gram Stain   Final    RARE WBC PRESENT,BOTH PMN AND MONONUCLEAR RARE GRAM POSITIVE COCCI    Culture   Final    FEW STAPHYLOCOCCUS AUREUS RARE STREPTOCOCCUS MITIS/ORALIS RARE KLEBSIELLA PNEUMONIAE NO ANAEROBES ISOLATED Performed at [facility], [place] [address], [place], [place] [address]    Report Status 11/11/2019 FINAL  Final   Organism ID, Bacteria STAPHYLOCOCCUS AUREUS  Final   Organism ID, Bacteria STREPTOCOCCUS MITIS/ORALIS  Final   Organism ID, Bacteria KLEBSIELLA PNEUMONIAE  Final      Susceptibility   Klebsiella pneumoniae - MIC*    AMPICILLIN >=32 RESISTANT Resistant     CEFAZOLIN <=4 SENSITIVE Sensitive     CEFEPIME <=0.12 SENSITIVE Sensitive     CEFTAZIDIME <=1 SENSITIVE Sensitive     CEFTRIAXONE <=0.25 SENSITIVE Sensitive     CIPROFLOXACIN <=0.25 SENSITIVE Sensitive     GENTAMICIN <=1 SENSITIVE Sensitive     IMIPENEM <=0.25 SENSITIVE Sensitive     TRIMETH/SULFA <=20 SENSITIVE Sensitive     AMPICILLIN/SULBACTAM 4 SENSITIVE Sensitive     PIP/TAZO <=4 SENSITIVE Sensitive     * RARE KLEBSIELLA PNEUMONIAE   Staphylococcus aureus - MIC*    CIPROFLOXACIN <=0.5 SENSITIVE Sensitive     ERYTHROMYCIN 4 INTERMEDIATE Intermediate     GENTAMICIN <=0.5 SENSITIVE Sensitive     OXACILLIN 0.5 SENSITIVE Sensitive     TETRACYCLINE <=1 SENSITIVE Sensitive     VANCOMYCIN <=0.5 SENSITIVE Sensitive      TRIMETH/SULFA <=10 SENSITIVE Sensitive     CLINDAMYCIN <=0.25 SENSITIVE Sensitive     RIFAMPIN <=0.5 SENSITIVE Sensitive     Inducible Clindamycin NEGATIVE Sensitive     * FEW STAPHYLOCOCCUS AUREUS   Streptococcus mitis/oralis - MIC*    TETRACYCLINE <=0.25 SENSITIVE Sensitive     VANCOMYCIN 0.25 SENSITIVE Sensitive     CLINDAMYCIN <=0.25 SENSITIVE Sensitive     PENICILLIN Value in next row Sensitive      SENSITIVE0.06    CEFTRIAXONE Value in next row Sensitive      SENSITIVE0.12    * RARE STREPTOCOCCUS MITIS/ORALIS  Surgical pcr screen     Status: Abnormal   Collection Time: 11/07/19  3:40 PM   Specimen: Nasal Mucosa; Nasal Swab  Result Value Ref Range Status   MRSA, PCR NEGATIVE NEGATIVE Final   Staphylococcus aureus POSITIVE (A) NEGATIVE Final    Comment: (NOTE) The Xpert SA Assay (FDA approved for NASAL specimens in patients 26 years of age and older), is one component of a comprehensive surveillance program. It is not intended to diagnose infection nor to guide or monitor treatment. Performed at [facility], [address] [place], [place] [address]   Culture, blood (Routine X 2) w Reflex to ID Panel     Status: None   Collection Time: 11/08/19 12:11 AM   Specimen: BLOOD  Result Value Ref Range Status   Specimen Description BLOOD RIGHT ANTECUBITAL  Final   Special Requests   Final    BOTTLES DRAWN AEROBIC AND ANAEROBIC Blood Culture results may not be optimal due to an excessive volume of blood received in culture bottles   Culture  Final    NO GROWTH 5 DAYS Performed at [facility], [address] [place], [place] [address]    Report Status 11/13/2019 FINAL  Final  Culture, blood (Routine X 2) w Reflex to ID Panel     Status: None   Collection Time: 11/08/19 12:17 AM   Specimen: BLOOD  Result Value Ref Range Status   Specimen Description BLOOD LEFT ANTECUBITAL  Final   Special Requests   Final    BOTTLES DRAWN AEROBIC AND ANAEROBIC Blood  Culture results may not be optimal due to an excessive volume of blood received in culture bottles   Culture   Final    NO GROWTH 5 DAYS Performed at [facility], [address], [place], [place] [address]    Report Status 11/13/2019 FINAL  Final     Radiology Studies: No results found.  Scheduled Meds: . brimonidine  1 drop Both Eyes BID  . chlorhexidine  60 mL Topical Once  . Chlorhexidine Gluconate Cloth  6 each Topical Daily  . Chlorhexidine Gluconate Cloth  6 each Topical Daily  . cholecalciferol  1,000 Units Oral Daily  . cyclobenzaprine  10 mg Oral TID  . enoxaparin (LOVENOX) injection  40 mg Subcutaneous Q24H  . insulin aspart  0-15 Units Subcutaneous TID WC  . insulin detemir  13 Units Subcutaneous BID  . iron polysaccharides  150 mg Oral Daily  . mupirocin ointment  1 application Nasal BID  . povidone-iodine  2 application Topical Once  . sodium chloride flush  10-40 mL Intracatheter Q12H  . timolol  1 drop Both Eyes BID  . vitamin B-12  1,000 mcg Oral Daily   Continuous Infusions: . sodium chloride 250 mL (11/11/19 1415)  .  ceFAZolin (ANCEF) IV 2 g (11/15/19 1438)  . metronidazole 500 mg (11/15/19 1716)     LOS: 10 days   Time spent: 25 minutes  Lorella Nimrod, MD [facility]  If 7PM-7AM, please contact night-coverage [email or web address]  11/15/2019, 5:28 PM   This record has been created using Systems analyst. Errors have been sought and corrected,but may not always be located. Such creation errors do not reflect on the standard of care.

## 2019-11-15 NOTE — TOC Progression Note (Signed)
Transition of Care [facility]) - Progression Note    Patient Details  Name: Gary Sutton MRN: [id number] Date of Birth: 04/13/1960  Transition of Care [facility]) CM/SW Contact  Liliana Cline, LCSW Phone Number: 11/15/2019, 4:12 PM  Clinical Narrative:   Dr. Nelson Chimes talked to patient. Patient now agreeable to SNF. CSW spoke to patient who confirms he will go to SNF for rehab. SNF workup started.         Expected Discharge Plan and Services                                                 Social Determinants of Health (SDOH) Interventions    Readmission Risk Interventions No flowsheet data found.

## 2019-11-15 NOTE — Plan of Care (Signed)

## 2019-11-15 NOTE — Plan of Care (Signed)
  Problem: Education: Goal: Knowledge of General Education information will improve Description Including pain rating scale, medication(s)/side effects and non-pharmacologic comfort measures Outcome: Progressing   Problem: Health Behavior/Discharge Planning: Goal: Ability to manage health-related needs will improve Outcome: Progressing   

## 2019-11-15 NOTE — Progress Notes (Signed)
PHARMACY CONSULT NOTE FOR:  OUTPATIENT  PARENTERAL ANTIBIOTIC THERAPY (OPAT)  Indication: bacteremia and osteo Regimen: Cefazolin 6gm IV over 24h as continuous infusion  (this was changed from q8h, to the above, for ease of infusion d/t poor vision and availability for family to assist with infusion) End date: 12/18/2019  IV antibiotic discharge orders are pended. To discharging provider:  please sign these orders via discharge navigator,  Select New Orders & click on the button choice - Manage This Unsigned Work.     Thank you for allowing pharmacy to be a part of this patient's care.  Cephus Shelling, PharmD Clinical Pharmacist   11/15/2019 11:13 AM

## 2019-11-16 DIAGNOSIS — M869 Osteomyelitis, unspecified: Secondary | ICD-10-CM | POA: Diagnosis not present

## 2019-11-16 LAB — GLUCOSE, CAPILLARY
Glucose-Capillary: 170 mg/dL — ABNORMAL HIGH (ref 70–99)
Glucose-Capillary: 191 mg/dL — ABNORMAL HIGH (ref 70–99)
Glucose-Capillary: 182 mg/dL — ABNORMAL HIGH (ref 70–99)
Glucose-Capillary: 143 mg/dL — ABNORMAL HIGH (ref 70–99)

## 2019-11-16 MED ORDER — INSULIN DETEMIR 100 UNIT/ML ~~LOC~~ SOLN
15.0000 [IU] | Freq: Two times a day (BID) | SUBCUTANEOUS | Status: DC
  Administered 2019-11-16 – 2019-11-20 (×8): 15 [IU] via SUBCUTANEOUS
  Filled 2019-11-16 (×10): qty 0.15

## 2019-11-16 NOTE — Plan of Care (Signed)

## 2019-11-16 NOTE — Evaluation (Signed)
Physical Therapy Evaluation Patient Details Name: Gary Sutton MRN: [id number] DOB: [birth year] Today's Date: 11/16/2019   History of Present Illness  Gary Sutton is a 59 year old male with PMH of type 2 diabetes mellitus, glaucoma, and degenerative disc disease. He presented to the ED with acute onset of RLE pain involving his leg and associated right foot swelling with erythema at the big toe. He is admitted for toe osteomyelitis and is pending surgery. Per MD conversation with Dr. Fran Lowes and Dr. Ether Griffins, pt is NWB to his R foot and could use the heel for transfer if needed.  Clinical Impression  Pt unable to complete STS with +43modA, despite good effort. With +2 modA able to complete STS with TC for hand placement onto RW d/t visual deficits. Amb 96ft with consistent cuing for WB precautions and to maintain balance, +2 overall to aid in line/lead management, and for redirection of RW d/t visual deficits. PT recommendation is SNF placement d/t decreased ind with basic transfers and limited amb ability. Would benefit from skilled PT to address above deficits and promote optimal return to PLOF.     Follow Up Recommendations SNF    Equipment Recommendations  Rolling walker with 5" wheels;3in1 (PT)    Recommendations for Other Services       Precautions / Restrictions Precautions Precaution Comments: Patient has glaucoma and is unable to see clearly Restrictions Weight Bearing Restrictions: Yes RLE Weight Bearing: Non weight bearing Other Position/Activity Restrictions: Per RN can put heel down for balance      Mobility  Bed Mobility Overal bed mobility: Needs Assistance Bed Mobility: Supine to Sit;Sit to Supine     Supine to sit: HOB elevated;Max assist Sit to supine: Mod assist   General bed mobility comments: Tactile cues utilized for proper hand placement. Pt needed assistance with trunk and LEs to sit EOB. Returning to bed, pt only needed help with LEs    Transfers Overall  transfer level: Needs assistance Equipment used: Rolling walker (2 wheeled) Transfers: Sit to/from Stand Sit to Stand: Mod assist;+2 safety/equipment         General transfer comment: Attempted +31mod A from chair, unable to initate. With +2 modA able to complete transfer, needs hand placement onto RW d/t vision deficits. Able to gain standing balance, remaining standing +104minA  Ambulation/Gait Ambulation/Gait assistance: Min assist;+2 physical assistance Gait Distance (Feet): 30 Feet Assistive device: Rolling walker (2 wheeled) Gait Pattern/deviations: Step-to pattern;Decreased step length - right;Decreased step length - left Gait velocity: increased   General Gait Details: Pt needed constant reminders to not put any weight on R toes, R heel down for balance. Needs redirection with RW d/t vision deficits  Stairs            Wheelchair Mobility    Modified Rankin (Stroke Patients Only)       Balance Overall balance assessment: Needs assistance Sitting-balance support: Feet supported Sitting balance-Leahy Scale: Good Sitting balance - Comments: Pt demonstrates good sitting balance with feet supported and no LOB within BOS   Standing balance support: Bilateral upper extremity supported;During functional activity Standing balance-Leahy Scale: Fair                               Pertinent Vitals/Pain Pain Assessment: Faces Pain Score: 2  Faces Pain Scale: Hurts a little bit Pain Location: R upper thigh Pain Descriptors / Indicators: Aching;Sore Pain Intervention(s): Limited activity within patient's tolerance;Repositioned  Home Living Family/patient expects to be discharged to:: Private residence Living Arrangements: Children Available Help at Discharge: Family;Available PRN/intermittently Type of Home: Apartment Home Access: Level entry     Home Layout: One level Home Equipment: None      Prior Function Level of Independence: Independent          Comments: Pt reports 0 falls in last 6 months. He is independent with I/ADLs (cooking, cleaning, bathing)     Hand Dominance   Dominant Hand: Right    Extremity/Trunk Assessment   Upper Extremity Assessment Upper Extremity Assessment: Defer to OT evaluation    Lower Extremity Assessment Lower Extremity Assessment: RLE deficits/detail RLE Deficits / Details: Pt has pain when moving RLE, however able to achieve full ROM RLE: Unable to fully assess due to pain RLE Sensation: WNL LLE Deficits / Details: Generally 4 to 4+/5 strength LLE Sensation: WNL       Communication   Communication: No difficulties  Cognition Arousal/Alertness: Awake/alert Behavior During Therapy: WFL for tasks assessed/performed Overall Cognitive Status: Within Functional Limits for tasks assessed                                 General Comments: Patient is A&Ox4      General Comments      Exercises Other Exercises Other Exercises: attempted STS modA, unable. With +52modA is able to complete transfer with TC needed for hand placement on RW d/t vision deficits Other Exercises: amb 62ft with consistent fuing for NWB on toes, heel down for balance (per RN), minimal compliance. Needs redirction of RW d/t vision deficits   Assessment/Plan    PT Assessment Patient needs continued PT services  PT Problem List Decreased strength;Decreased range of motion;Decreased activity tolerance;Decreased balance;Decreased mobility;Decreased coordination;Decreased knowledge of precautions;Pain;Decreased skin integrity       PT Treatment Interventions DME instruction;Gait training;Functional mobility training;Therapeutic activities;Therapeutic exercise;Balance training;Neuromuscular re-education    PT Goals (Current goals can be found in the Care Plan section)  Acute Rehab PT Goals Patient Stated Goal: "to go home" PT Goal Formulation: With patient Time For Goal Achievement: 11/30/19     Frequency Min 2X/week   Barriers to discharge Decreased caregiver support      Co-evaluation               AM-PAC PT "6 Clicks" Mobility  Outcome Measure Help needed turning from your back to your side while in a flat bed without using bedrails?: A Little Help needed moving from lying on your back to sitting on the side of a flat bed without using bedrails?: A Little Help needed moving to and from a bed to a chair (including a wheelchair)?: A Lot Help needed standing up from a chair using your arms (e.g., wheelchair or bedside chair)?: A Lot Help needed to walk in hospital room?: A Lot Help needed climbing 3-5 steps with a railing? : A Lot 6 Click Score: 14    End of Session Equipment Utilized During Treatment: Gait belt Activity Tolerance: Patient limited by pain;Patient tolerated treatment well;Patient limited by fatigue Patient left: in bed;with call bell/phone within reach;with bed alarm set Nurse Communication: Mobility status PT Visit Diagnosis: Unsteadiness on feet (R26.81);Other abnormalities of gait and mobility (R26.89);Muscle weakness (generalized) (M62.81);Difficulty in walking, not elsewhere classified (R26.2);Pain Pain - Right/Left: Right Pain - part of body: Leg    Time: 1150-1208 PT Time Calculation (min) (ACUTE ONLY): 18 min  Charges:   PT Evaluation $PT Eval Moderate Complexity: 1 Mod PT Treatments $Therapeutic Activity: 8-22 mins      Hilda Lias DPT  Hilda Lias 11/16/2019, 2:02 PM

## 2019-11-16 NOTE — Progress Notes (Addendum)
PROGRESS NOTE    Gary Sutton  RCV:[id number] DOB: [birth year] DOA: 11/05/2019 PCP: Patient, No Pcp Per   Brief Narrative:  Gary Sutton y.o.malewith a known history of type 2 diabetes mellitus, glaucoma and degenerative disc disease, who presented to the emergency room with acute onset of right lower extremity pain involving his leg with associated right foot swelling with erythema at the big toe. Patient was admitted for sepsis secondary to right big toe diabetic ulcer and osteomyelitis along with MSSA bacteremia. Podiatry was consulted but patient refused amputation. Patient is legally blind.  Now agrees to go to SNF as family is not comfortable providing him with IV antibiotics at home.  Had a bed offer-waiting for insurance authorization.  Subjective: Patient was eating breakfast when seen today.  No new complaint.  Agrees to go to SNF and accepted a bed offer at [facility].  Assessment & Plan:   Active Problems:   Osteomyelitis (HCC)  Sepsis secondary to MSSA bacteremia due to right big toe osteomyelitis and diabetic ulcer.  Initially started on cefepime and vancomycin and then later switched to cefazolin and Flagyl per ID. Patient needs amputation but he refused to go to the OR just before transportation.  He was also refusing SNF placement. Family is not comfortable giving IV antibiotics at home. -Continue cefazolin and Flagyl for now-He will need total of 6 weeks of cefazolin. -Had a bed offer at [facility]	[place] authorization.  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  MRI lumbar spine, right hip showed the above findings. CK not elevated. --supportive care for now --Flexeril PRN and tramadol PRN.  Poorly controlled diabetes mellitus with hyperglycemia.  Apparently not even  taking his home Metformin.  A1c of 12.6.  CBG mildly elevated Patient refused to start insulin after discharge. -Increase Levemir to 15 units twice daily. -Continue with SSI.  Microcytic anemia with iron and vitamin B12 deficiency. Hemoglobin seems stable. -Continue with iron and B12 supplement.  Glaucoma. -Continue home meds.  Suspect multiple myeloma -increased in urine protein, MRI spine showing possible marrow replacement processes. -SPEP neg for MM.  "pattern appears to be compatible with a chronic inflammatory response."   Objective: Vitals:   11/15/19 0808 11/15/19 1457 11/15/19 2248 11/16/19 0724  BP: 129/76 140/79 116/73 120/74  Pulse: 94 (!) 102 94 92  Resp: _0 Temp: 98 F (36.7 C) 98.1 F (36.7 C) 98.4 F (36.9 C) 97.7 F (36.5 C)  TempSrc: Oral Oral Oral Oral  SpO2: 99% 100% 97% 98%  Weight:      Height:        Intake/Output Summary (Last 24 hours) at 11/16/2019 1406 Last data filed at 11/16/2019 1034 Gross per 24 hour  Intake 350 ml  Output 1150 ml  Net -800 ml   Filed Weights   11/05/19 1841  Weight: 86.2 kg    Examination:  General.  Well-developed gentleman, in no acute distress. Pulmonary.  Lungs clear bilaterally, normal respiratory effort. CV.  Regular rate and rhythm, no JVD, rub or murmur. Abdomen.  Soft, nontender, nondistended, BS positive. CNS.  Alert and oriented x3.  No focal neurologic deficit. Extremities.  Extremely dry lower extremities, bandage on right foot with some bloody discharge on it. Psychiatry.  Judgment and insight appears normal.  DVT prophylaxis: Lovenox Code  Status: Full Family Communication: Discussed with patient. Disposition Plan:  Status is: Inpatient  Remains inpatient appropriate because:Inpatient level of care appropriate due to severity of illness   Dispo: The patient is from: Home              Anticipated d/c is to: SNF              Anticipated d/c date is: 2 days              Patient  currently is medically stable for discharge.  Had a bed offer-pending insurance authorization.  Consultants:   Podiatry  ID  Procedures:  Antimicrobials:  Cefazolin Flagyl  Data Reviewed: I have personally reviewed following labs and imaging studies  CBC: Recent Labs  Lab 11/11/19 0150 11/12/19 0305 11/13/19 0337 11/14/19 0525 11/15/19 0535  WBC 10.3 10.3 9.6 10.6* 8.4  HGB 9.5* 9.8* 9.3* 10.0* 9.9*  HCT 30.3* 31.2* 29.7* 31.4* 31.4*  MCV 75.0* 74.3* 75.2* 74.8* 74.1*  PLT 597* 641* 588* 648* 962*   Basic Metabolic Panel: Recent Labs  Lab 11/11/19 0150 11/12/19 0305 11/13/19 0337 11/14/19 0525 11/15/19 0535  NA 131* 130* 131* 128* 129*  K 4.7 4.5 4.4 5.2* 5.1  CL 94* 93* 94* 92* 94*  CO2 _0 GLUCOSE 148* 145* 135* 142* 161*  BUN _1 CREATININE 1.01 1.00 1.02 1.04 1.02  CALCIUM 7.9* 8.3* 8.1* 8.4* 8.6*  MG 2.2 2.0 2.0 1.9 2.0   GFR: Estimated Creatinine Clearance: 88.4 mL/min (by C-G formula based on SCr of 1.02 mg/dL). Liver Function Tests: No results for input(s): AST, ALT, ALKPHOS, BILITOT, PROT, ALBUMIN in the last 168 hours. No results for input(s): LIPASE, AMYLASE in the last 168 hours. No results for input(s): AMMONIA in the last 168 hours. Coagulation Profile: No results for input(s): INR, PROTIME in the last 168 hours. Cardiac Enzymes: No results for input(s): CKTOTAL, CKMB, CKMBINDEX, TROPONINI in the last 168 hours. BNP (last 3 results) No results for input(s): PROBNP in the last 8760 hours. HbA1C: No results for input(s): HGBA1C in the last 72 hours. CBG: Recent Labs  Lab 11/15/19 1200 11/15/19 1659 11/15/19 2015 11/16/19 0726 11/16/19 1145  GLUCAP 149* 123* 124* 170* 191*   Lipid Profile: No results for input(s): CHOL, HDL, LDLCALC, TRIG, CHOLHDL, LDLDIRECT in the last 72 hours. Thyroid Function Tests: No results for input(s): TSH, T4TOTAL, FREET4, T3FREE, THYROIDAB in the last 72 hours. Anemia Panel: No  results for input(s): VITAMINB12, FOLATE, FERRITIN, TIBC, IRON, RETICCTPCT in the last 72 hours. Sepsis Labs: No results for input(s): PROCALCITON, LATICACIDVEN in the last 168 hours.  Recent Results (from the past 240 hour(s))  Aerobic/Anaerobic Culture (surgical/deep wound)     Status: None   Collection Time: 11/06/19  5:55 PM   Specimen: Toe  Result Value Ref Range Status   Specimen Description   Final    TOE LEFT GREAT TOE Performed at [facility], [address], [place], [place] [address]    Special Requests   Final    NONE Performed at [facility], [place], [place] [address]    Gram Stain   Final    RARE WBC PRESENT,BOTH PMN AND MONONUCLEAR RARE GRAM POSITIVE COCCI    Culture   Final    FEW STAPHYLOCOCCUS AUREUS RARE STREPTOCOCCUS MITIS/ORALIS RARE KLEBSIELLA PNEUMONIAE NO ANAEROBES ISOLATED Performed at [facility], [place] [address], [place], [place] [address]  Report Status 11/11/2019 FINAL  Final   Organism ID, Bacteria STAPHYLOCOCCUS AUREUS  Final   Organism ID, Bacteria STREPTOCOCCUS MITIS/ORALIS  Final   Organism ID, Bacteria KLEBSIELLA PNEUMONIAE  Final      Susceptibility   Klebsiella pneumoniae - MIC*    AMPICILLIN >=32 RESISTANT Resistant     CEFAZOLIN <=4 SENSITIVE Sensitive     CEFEPIME <=0.12 SENSITIVE Sensitive     CEFTAZIDIME <=1 SENSITIVE Sensitive     CEFTRIAXONE <=0.25 SENSITIVE Sensitive     CIPROFLOXACIN <=0.25 SENSITIVE Sensitive     GENTAMICIN <=1 SENSITIVE Sensitive     IMIPENEM <=0.25 SENSITIVE Sensitive     TRIMETH/SULFA <=20 SENSITIVE Sensitive     AMPICILLIN/SULBACTAM 4 SENSITIVE Sensitive     PIP/TAZO <=4 SENSITIVE Sensitive     * RARE KLEBSIELLA PNEUMONIAE   Staphylococcus aureus - MIC*    CIPROFLOXACIN <=0.5 SENSITIVE Sensitive     ERYTHROMYCIN 4 INTERMEDIATE Intermediate     GENTAMICIN <=0.5 SENSITIVE Sensitive     OXACILLIN 0.5 SENSITIVE Sensitive     TETRACYCLINE <=1 SENSITIVE  Sensitive     VANCOMYCIN <=0.5 SENSITIVE Sensitive     TRIMETH/SULFA <=10 SENSITIVE Sensitive     CLINDAMYCIN <=0.25 SENSITIVE Sensitive     RIFAMPIN <=0.5 SENSITIVE Sensitive     Inducible Clindamycin NEGATIVE Sensitive     * FEW STAPHYLOCOCCUS AUREUS   Streptococcus mitis/oralis - MIC*    TETRACYCLINE <=0.25 SENSITIVE Sensitive     VANCOMYCIN 0.25 SENSITIVE Sensitive     CLINDAMYCIN <=0.25 SENSITIVE Sensitive     PENICILLIN Value in next row Sensitive      SENSITIVE0.06    CEFTRIAXONE Value in next row Sensitive      SENSITIVE0.12    * RARE STREPTOCOCCUS MITIS/ORALIS  Surgical pcr screen     Status: Abnormal   Collection Time: 11/07/19  3:40 PM   Specimen: Nasal Mucosa; Nasal Swab  Result Value Ref Range Status   MRSA, PCR NEGATIVE NEGATIVE Final   Staphylococcus aureus POSITIVE (A) NEGATIVE Final    Comment: (NOTE) The Xpert SA Assay (FDA approved for NASAL specimens in patients 108 years of age and older), is one component of a comprehensive surveillance program. It is not intended to diagnose infection nor to guide or monitor treatment. Performed at [facility], [address] [place], [place] [address]   Culture, blood (Routine X 2) w Reflex to ID Panel     Status: None   Collection Time: 11/08/19 12:11 AM   Specimen: BLOOD  Result Value Ref Range Status   Specimen Description BLOOD RIGHT ANTECUBITAL  Final   Special Requests   Final    BOTTLES DRAWN AEROBIC AND ANAEROBIC Blood Culture results may not be optimal due to an excessive volume of blood received in culture bottles   Culture   Final    NO GROWTH 5 DAYS Performed at [facility], [address] [place], [place] [address]    Report Status 11/13/2019 FINAL  Final  Culture, blood (Routine X 2) w Reflex to ID Panel     Status: None   Collection Time: 11/08/19 12:17 AM   Specimen: BLOOD  Result Value Ref Range Status   Specimen Description BLOOD LEFT ANTECUBITAL  Final   Special Requests    Final    BOTTLES DRAWN AEROBIC AND ANAEROBIC Blood Culture results may not be optimal due to an excessive volume of blood received in culture bottles   Culture   Final    NO  GROWTH 5 DAYS Performed at [facility], [address] [place], [place] [address]    Report Status 11/13/2019 FINAL  Final     Radiology Studies: No results found.  Scheduled Meds: . brimonidine  1 drop Both Eyes BID  . chlorhexidine  60 mL Topical Once  . Chlorhexidine Gluconate Cloth  6 each Topical Daily  . Chlorhexidine Gluconate Cloth  6 each Topical Daily  . cholecalciferol  1,000 Units Oral Daily  . cyclobenzaprine  10 mg Oral TID  . enoxaparin (LOVENOX) injection  40 mg Subcutaneous Q24H  . insulin aspart  0-15 Units Subcutaneous TID WC  . insulin detemir  13 Units Subcutaneous BID  . iron polysaccharides  150 mg Oral Daily  . mupirocin ointment  1 application Nasal BID  . povidone-iodine  2 application Topical Once  . sodium chloride flush  10-40 mL Intracatheter Q12H  . timolol  1 drop Both Eyes BID  . vitamin B-12  1,000 mcg Oral Daily   Continuous Infusions: . sodium chloride 250 mL (11/11/19 1415)  .  ceFAZolin (ANCEF) IV 2 g (11/16/19 0525)  . metronidazole 500 mg (11/16/19 0846)     LOS: 11 days   Time spent: 25 minutes  Lorella Nimrod, MD [facility]  If 7PM-7AM, please contact night-coverage [email or web address]  11/16/2019, 2:06 PM   This record has been created using Systems analyst. Errors have been sought and corrected,but may not always be located. Such creation errors do not reflect on the standard of care.

## 2019-11-16 NOTE — TOC Progression Note (Addendum)
Transition of Care [facility]) - Progression Note    Patient Details  Name: Gary Sutton MRN: [id number] Date of Birth: 1960/11/12  [facility]) CM/SW Contact  Maud Deed, LCSW Phone Number: 11/16/2019, 11:02 AM  Clinical Narrative:    CSW called pt to provide bed offers and pt is agreeable to going to [facility]. [facility] unable to accept the pt until Monday          Expected Discharge Plan and Services                                                 Social Determinants of Health (SDOH) Interventions    Readmission Risk Interventions No flowsheet data found.

## 2019-11-17 ENCOUNTER — Inpatient Hospital Stay: Payer: Medicare Other | Admitting: Anesthesiology

## 2019-11-17 ENCOUNTER — Encounter: Payer: Self-pay | Admitting: Family Medicine

## 2019-11-17 ENCOUNTER — Encounter
Admission: EM | Disposition: A | Discharge status: Skilled Nursing Facility | Payer: Self-pay | Source: Home / Self Care | Attending: Hospitalist

## 2019-11-17 DIAGNOSIS — M869 Osteomyelitis, unspecified: Secondary | ICD-10-CM | POA: Diagnosis not present

## 2019-11-17 HISTORY — PX: AMPUTATION: SHX166

## 2019-11-17 LAB — TYPE AND SCREEN
ABO/RH(D): O POS
Antibody Screen: NEGATIVE

## 2019-11-17 LAB — GLUCOSE, CAPILLARY
Glucose-Capillary: 106 mg/dL — ABNORMAL HIGH (ref 70–99)
Glucose-Capillary: 129 mg/dL — ABNORMAL HIGH (ref 70–99)
Glucose-Capillary: 151 mg/dL — ABNORMAL HIGH (ref 70–99)
Glucose-Capillary: 164 mg/dL — ABNORMAL HIGH (ref 70–99)
Glucose-Capillary: 83 mg/dL (ref 70–99)

## 2019-11-17 LAB — COMPREHENSIVE METABOLIC PANEL
ALT: 7 U/L (ref 0–44)
AST: 19 U/L (ref 15–41)
Albumin: 2.4 g/dL — ABNORMAL LOW (ref 3.5–5.0)
Alkaline Phosphatase: 64 U/L (ref 38–126)
Anion gap: 6 (ref 5–15)
BUN: 22 mg/dL (ref 8–23)
CO2: 30 mmol/L (ref 22–32)
Calcium: 8.8 mg/dL — ABNORMAL LOW (ref 8.9–10.3)
Chloride: 95 mmol/L — ABNORMAL LOW (ref 98–111)
Creatinine, Ser: 1.01 mg/dL (ref 0.61–1.24)
GFR, Estimated: >60 mL/min (ref >60)
Glucose, Bld: 107 mg/dL — ABNORMAL HIGH (ref 70–99)
Potassium: 5.2 mmol/L — ABNORMAL HIGH (ref 3.5–5.1)
Sodium: 131 mmol/L — ABNORMAL LOW (ref 135–145)
Total Bilirubin: 0.4 mg/dL (ref 0.3–1.2)
Total Protein: 9.3 g/dL — ABNORMAL HIGH (ref 6.5–8.1)

## 2019-11-17 LAB — MAGNESIUM: Magnesium: 2 mg/dL (ref 1.7–2.4)

## 2019-11-17 LAB — SARS CORONAVIRUS 2 BY RT PCR (HOSPITAL ORDER, PERFORMED IN ~~LOC~~ HOSPITAL LAB): SARS Coronavirus 2: NEGATIVE

## 2019-11-17 LAB — PROTIME-INR
INR: 1.3 — ABNORMAL HIGH (ref 0.8–1.2)
Prothrombin Time: 16 seconds — ABNORMAL HIGH (ref 11.4–15.2)

## 2019-11-17 LAB — APTT: aPTT: 38 seconds — ABNORMAL HIGH (ref 24–36)

## 2019-11-17 SURGERY — AMPUTATION, FOOT, RAY
Anesthesia: General | Laterality: Right

## 2019-11-17 MED ORDER — SODIUM CHLORIDE 0.9 % IV SOLN
INTRAVENOUS | Status: DC | PRN
  Administered 2019-11-17: 75 ug/min via INTRAVENOUS

## 2019-11-17 MED ORDER — LIDOCAINE HCL (CARDIAC) PF 100 MG/5ML IV SOSY
PREFILLED_SYRINGE | INTRAVENOUS | Status: DC | PRN
  Administered 2019-11-17: 60 mg via INTRAVENOUS

## 2019-11-17 MED ORDER — NEOMYCIN-POLYMYXIN B GU 40-200000 IR SOLN
Status: AC
  Filled 2019-11-17: qty 2

## 2019-11-17 MED ORDER — FENTANYL CITRATE (PF) 100 MCG/2ML IJ SOLN: 25.0000 ug | INTRAMUSCULAR | Status: DC | PRN

## 2019-11-17 MED ORDER — SODIUM CHLORIDE 0.9 % IV SOLN: INTRAVENOUS | Status: DC | PRN

## 2019-11-17 MED ORDER — PROPOFOL 10 MG/ML IV BOLUS
INTRAVENOUS | Status: DC | PRN
  Administered 2019-11-17: 150 mg via INTRAVENOUS

## 2019-11-17 MED ORDER — PROPOFOL 10 MG/ML IV BOLUS
INTRAVENOUS | Status: AC
  Filled 2019-11-17: qty 20

## 2019-11-17 MED ORDER — VANCOMYCIN HCL 1000 MG IV SOLR
INTRAVENOUS | Status: AC
  Filled 2019-11-17: qty 1000

## 2019-11-17 MED ORDER — MIDAZOLAM HCL 2 MG/2ML IJ SOLN
INTRAMUSCULAR | Status: AC
  Filled 2019-11-17: qty 2

## 2019-11-17 MED ORDER — ONDANSETRON HCL 4 MG/2ML IJ SOLN: 4.0000 mg | Freq: Once | INTRAMUSCULAR | Status: DC | PRN

## 2019-11-17 MED ORDER — BUPIVACAINE HCL (PF) 0.5 % IJ SOLN
INTRAMUSCULAR | Status: AC
  Filled 2019-11-17: qty 30

## 2019-11-17 MED ORDER — FENTANYL CITRATE (PF) 100 MCG/2ML IJ SOLN
INTRAMUSCULAR | Status: DC | PRN
  Administered 2019-11-17: 50 ug via INTRAVENOUS

## 2019-11-17 MED ORDER — DEXAMETHASONE SODIUM PHOSPHATE 10 MG/ML IJ SOLN
INTRAMUSCULAR | Status: DC | PRN
  Administered 2019-11-17: 5 mg via INTRAVENOUS

## 2019-11-17 MED ORDER — FENTANYL CITRATE (PF) 100 MCG/2ML IJ SOLN
INTRAMUSCULAR | Status: AC
  Filled 2019-11-17: qty 2

## 2019-11-17 SURGICAL SUPPLY — 48 items
BLADE MED AGGRESSIVE (BLADE) ×3 IMPLANT
BLADE OSC/SAGITTAL MD 5.5X18 (BLADE) ×3 IMPLANT
BLADE SURG 15 STRL LF DISP TIS (BLADE) ×2 IMPLANT
BLADE SURG 15 STRL SS (BLADE) ×4
BLADE SURG MINI STRL (BLADE) ×3 IMPLANT
BNDG CONFORM 2 STRL LF (GAUZE/BANDAGES/DRESSINGS) ×3 IMPLANT
BNDG ELASTIC 4X5.8 VLCR STR LF (GAUZE/BANDAGES/DRESSINGS) ×3 IMPLANT
BNDG ESMARK 4X12 TAN STRL LF (GAUZE/BANDAGES/DRESSINGS) ×3 IMPLANT
BNDG GAUZE 4.5X4.1 6PLY STRL (MISCELLANEOUS) ×3 IMPLANT
CANISTER SUCT 1200ML W/VALVE (MISCELLANEOUS) ×3 IMPLANT
CLOSURE WOUND 1/4X4 (GAUZE/BANDAGES/DRESSINGS) ×1
COVER WAND RF STERILE (DRAPES) ×3 IMPLANT
CUFF TOURN DUAL PL 12 NO SLV (MISCELLANEOUS) ×3 IMPLANT
CUFF TOURN SGL QUICK 18X4 (TOURNIQUET CUFF) ×3 IMPLANT
DRAPE FLUOR MINI C-ARM 54X84 (DRAPES) ×3 IMPLANT
DURAPREP 26ML APPLICATOR (WOUND CARE) ×3 IMPLANT
ELECT REM PT RETURN 9FT ADLT (ELECTROSURGICAL) ×3
ELECTRODE REM PT RTRN 9FT ADLT (ELECTROSURGICAL) ×1 IMPLANT
GAUZE SPONGE 4X4 12PLY STRL (GAUZE/BANDAGES/DRESSINGS) ×3 IMPLANT
GAUZE XEROFORM 1X8 LF (GAUZE/BANDAGES/DRESSINGS) ×3 IMPLANT
GLOVE BIO SURGEON STRL SZ7.5 (GLOVE) ×3 IMPLANT
GLOVE INDICATOR 8.0 STRL GRN (GLOVE) ×3 IMPLANT
GOWN STRL REUS W/ TWL LRG LVL3 (GOWN DISPOSABLE) ×2 IMPLANT
GOWN STRL REUS W/TWL LRG LVL3 (GOWN DISPOSABLE) ×4
HANDPIECE VERSAJET DEBRIDEMENT (MISCELLANEOUS) ×3 IMPLANT
KIT TURNOVER KIT A (KITS) ×3 IMPLANT
LABEL OR SOLS (LABEL) ×3 IMPLANT
NDL FILTER BLUNT 18X1 1/2 (NEEDLE) ×1 IMPLANT
NDL HYPO 25X1 1.5 SAFETY (NEEDLE) ×2 IMPLANT
NEEDLE FILTER BLUNT 18X 1/2SAF (NEEDLE) ×2
NEEDLE FILTER BLUNT 18X1 1/2 (NEEDLE) ×1 IMPLANT
NEEDLE HYPO 25X1 1.5 SAFETY (NEEDLE) ×6 IMPLANT
NS IRRIG 500ML POUR BTL (IV SOLUTION) ×3 IMPLANT
PACK EXTREMITY (MISCELLANEOUS) ×3 IMPLANT
SOL .9 NS 3000ML IRR  AL (IV SOLUTION) ×2
SOL .9 NS 3000ML IRR UROMATIC (IV SOLUTION) ×1 IMPLANT
SOL PREP PVP 2OZ (MISCELLANEOUS) ×3
SOLUTION PREP PVP 2OZ (MISCELLANEOUS) ×1 IMPLANT
STOCKINETTE BIAS CUT 4 980044 (GAUZE/BANDAGES/DRESSINGS) ×3 IMPLANT
STOCKINETTE STRL 6IN 960660 (GAUZE/BANDAGES/DRESSINGS) ×3 IMPLANT
STRIP CLOSURE SKIN 1/4X4 (GAUZE/BANDAGES/DRESSINGS) ×2 IMPLANT
SUT ETHILON 3-0 FS-10 30 BLK (SUTURE) ×3
SUT ETHILON 4-0 (SUTURE)
SUT ETHILON 4-0 FS2 18XMFL BLK (SUTURE)
SUTURE EHLN 3-0 FS-10 30 BLK (SUTURE) ×1 IMPLANT
SUTURE ETHLN 4-0 FS2 18XMF BLK (SUTURE) IMPLANT
SWAB DUAL CULTURE TRANS RED ST (MISCELLANEOUS) ×3 IMPLANT
SYR 10ML LL (SYRINGE) ×3 IMPLANT

## 2019-11-17 NOTE — Transfer of Care (Signed)
Immediate Anesthesia Transfer of Care Note  Patient: Gary Sutton  Procedure(s) Performed: Right first ray resection (Right )  Patient Location: PACU  Anesthesia Type:General  Level of Consciousness: sedated  Airway & Oxygen Therapy: Patient Spontanous Breathing and Patient connected to face mask oxygen  Post-op Assessment: Report given to RN and Post -op Vital signs reviewed and stable  Post vital signs: Reviewed and stable  Last Vitals:  Vitals Value Taken Time  BP 123/75 11/17/19 2351  Temp    Pulse 87 11/17/19 2352  Resp 18 11/17/19 2352  SpO2 100 % 11/17/19 2352  Vitals shown include unvalidated device data.  Last Pain:  Vitals:   11/17/19 0958  TempSrc:   PainSc: 4       Patients Stated Pain Goal: 0 (11/17/19 0858)  Complications: No complications documented.

## 2019-11-17 NOTE — Progress Notes (Signed)
Writer spoke with Dr. Nelson Chimes and Dr. Alberteen Spindle and stated to make patient NPO until Dr. Alberteen Spindle abl;e to come this afternoon to assess patient's right foot.

## 2019-11-17 NOTE — Anesthesia Preprocedure Evaluation (Signed)
Anesthesia Evaluation  Patient identified by MRN, date of birth, ID band Patient awake    Reviewed: Allergy & Precautions, NPO status , Patient's Chart, lab work & pertinent test results  Airway Mallampati: III       Dental   Pulmonary neg pulmonary ROS,    Pulmonary exam normal        Cardiovascular negative cardio ROS Normal cardiovascular exam     Neuro/Psych negative neurological ROS  negative psych ROS   GI/Hepatic negative GI ROS, Neg liver ROS,   Endo/Other  diabetes  Renal/GU negative Renal ROS  negative genitourinary   Musculoskeletal negative musculoskeletal ROS (+)   Abdominal Normal abdominal exam  (+)   Peds negative pediatric ROS (+)  Hematology negative hematology ROS (+)   Anesthesia Other Findings Past Medical History: No date: Diabetes mellitus without complication (HCC) No date: Glaucoma No date: H/O degenerative disc disease   Reproductive/Obstetrics                             Anesthesia Physical  Anesthesia Plan  ASA: II  Anesthesia Plan: General   Post-op Pain Management:    Induction: Intravenous  PONV Risk Score and Plan:   Airway Management Planned: LMA and Oral ETT  Additional Equipment:   Intra-op Plan:   Post-operative Plan: Extubation in OR  Informed Consent: I have reviewed the patients History and Physical, chart, labs and discussed the procedure including the risks, benefits and alternatives for the proposed anesthesia with the patient or authorized representative who has indicated his/her understanding and acceptance.     Dental advisory given  Plan Discussed with:   Anesthesia Plan Comments:         Anesthesia Quick Evaluation

## 2019-11-17 NOTE — H&P (View-Only) (Signed)
9 Days Post-Op   Subjective/Chief Complaint: Patient seen for evaluation of amputation of his right great toe and first metatarsal due to osteomyelitis.  Patient was scheduled for discharge to skilled nursing tomorrow and he has finally agreed to consent for amputation of the right great toe and first metatarsal.  Has previously declined to have this performed on 2 occasions with Dr. Ether Griffins.   Objective: Vital signs in last 24 hours: Temp:  [98 F (36.7 C)-98.8 F (37.1 C)] 98.8 F (37.1 C) (10/24 1528) Pulse Rate:  [91-96] 96 (10/24 1528) Resp:  [15-17] 17 (10/24 1528) BP: (116-121)/(73-76) 121/73 (10/24 1528) SpO2:  [96 %-98 %] 96 % (10/24 1528) Last BM Date: 11/16/19  Intake/Output from previous day: 10/23 0701 - 10/24 0700 In: 230 [P.O.:220; I.V.:10] Out: 1650 [Urine:1650] Intake/Output this shift: Total I/O In: -  Out: 1250 [Urine:1250]  Bandage intact on the right foot.  X-ray and MRI findings reveal significant osteomyelitis involving the entire first metatarsal with collapse of the first metatarsal head.  Lab Results:  Recent Labs    11/15/19 0535  WBC 8.4  HGB 9.9*  HCT 31.4*  PLT 629*   BMET Recent Labs    11/15/19 0535  NA 129*  K 5.1  CL 94*  CO2 28  GLUCOSE 161*  BUN 17  CREATININE 1.02  CALCIUM 8.6*   PT/INR No results for input(s): LABPROT, INR in the last 72 hours. ABG No results for input(s): PHART, HCO3 in the last 72 hours.  Invalid input(s): PCO2, PO2  Studies/Results: No results found.  Anti-infectives: Anti-infectives (From admission, onward)   Start     Dose/Rate Route Frequency Ordered Stop   11/07/19 1715  metroNIDAZOLE (FLAGYL) IVPB 500 mg        500 mg 100 mL/hr over 60 Minutes Intravenous Every 8 hours 11/07/19 1622     11/06/19 1400  ceFAZolin (ANCEF) IVPB 2g/100 mL premix        2 g 200 mL/hr over 30 Minutes Intravenous Every 8 hours 11/06/19 0925     11/06/19 0900  vancomycin (VANCOCIN) IVPB 1000 mg/200 mL premix   Status:  Discontinued        1,000 mg 200 mL/hr over 60 Minutes Intravenous Every 12 hours 11/06/19 0110 11/06/19 0757   11/06/19 0900  vancomycin (VANCOREADY) IVPB 1500 mg/300 mL  Status:  Discontinued        1,500 mg 150 mL/hr over 120 Minutes Intravenous Every 12 hours 11/06/19 0757 11/06/19 0925   11/06/19 0800  ceFEPIme (MAXIPIME) 2 g in sodium chloride 0.9 % 100 mL IVPB  Status:  Discontinued        2 g 200 mL/hr over 30 Minutes Intravenous Every 8 hours 11/06/19 0107 11/06/19 0925   11/06/19 0230  ceFEPIme (MAXIPIME) 2 g in sodium chloride 0.9 % 100 mL IVPB  Status:  Discontinued        2 g 200 mL/hr over 30 Minutes Intravenous  Once 11/06/19 0228 11/06/19 0232   11/06/19 0230  vancomycin (VANCOCIN) IVPB 1000 mg/200 mL premix  Status:  Discontinued        1,000 mg 200 mL/hr over 60 Minutes Intravenous  Once 11/06/19 0228 11/06/19 0234   11/05/19 2345  ceFEPIme (MAXIPIME) 2 g in sodium chloride 0.9 % 100 mL IVPB        2 g 200 mL/hr over 30 Minutes Intravenous  Once 11/05/19 2324 11/06/19 0222   11/05/19 2330  vancomycin (VANCOCIN) IVPB 1000 mg/200 mL premix  Status:  Discontinued        1,000 mg 200 mL/hr over 60 Minutes Intravenous  Once 11/05/19 2324 11/05/19 2326   11/05/19 2115  vancomycin (VANCOCIN) IVPB 1000 mg/200 mL premix        1,000 mg 200 mL/hr over 60 Minutes Intravenous  Once 11/05/19 2112 11/05/19 2227      Assessment/Plan: s/p Procedure(s): AMPUTATION RIGHT 1ST RAY (Right) Assessment: Osteomyelitis right first metatarsal.   Plan: Discussed with the patient again the need for amputation of his right great toe with ray resection to remove the infected first metatarsal.  Discussed possible risks and complications of the procedure including inability of the foot to heal due to persistent infection or his diabetes or vascular status.  No guarantees can be given as to the outcome of the procedure.  Discussed with the patient since surgery has been delayed for over a  week that the chances of expanded infection could result in further loss of his foot or possibly lower leg.  Patient agrees to have surgery performed this evening.  Consent form for amputation right first ray with amputation of the hallux.  Patient is n.p.o.  Plan for surgery this evening.  LOS: 12 days    Jaevin Medearis W Sherronda Sweigert 11/17/2019 

## 2019-11-17 NOTE — Anesthesia Procedure Notes (Signed)
Procedure Name: LMA Insertion Date/Time: 11/17/2019 11:04 PM Performed by: Junious Silk, CRNA Pre-anesthesia Checklist: Patient identified, Patient being monitored, Timeout performed, Emergency Drugs available and Suction available Patient Re-evaluated:Patient Re-evaluated prior to induction Oxygen Delivery Method: Circle system utilized Preoxygenation: Pre-oxygenation with 100% oxygen Induction Type: IV induction Ventilation: Mask ventilation without difficulty LMA: LMA inserted LMA Size: 4.0 Tube type: Oral Number of attempts: 1 Placement Confirmation: positive ETCO2 and breath sounds checked- equal and bilateral Tube secured with: Tape Dental Injury: Teeth and Oropharynx as per pre-operative assessment

## 2019-11-17 NOTE — Progress Notes (Signed)
PROGRESS NOTE    Gary Sutton  AFB:[id number] DOB: 03/06/1960 DOA: 11/05/2019 PCP: Patient, No Pcp Per   Brief Narrative:  Gary Sutton y.o.malewith a known history of type 2 diabetes mellitus, glaucoma and degenerative disc disease, who presented to the emergency room with acute onset of right lower extremity pain involving his leg with associated right foot swelling with erythema at the big toe. Patient was admitted for sepsis secondary to right big toe diabetic ulcer and osteomyelitis along with MSSA bacteremia. Podiatry was consulted but patient refused amputation. Patient is legally blind.  Now agrees to go to SNF as family is not comfortable providing him with IV antibiotics at home.  Had a bed offer-waiting for insurance authorization.  Subjective: Patient has no new complaint when seen today.  He was okay to go to SNF when I talked with him this morning.  Later received a message from nursing staff that he wants to have amputation of his big toe before going to SNF. Send a message to Dr. Cleda Mccreedy who will see him again later today.  Assessment & Plan:   Active Problems:   Osteomyelitis (HCC)  Sepsis secondary to MSSA bacteremia due to right big toe osteomyelitis and diabetic ulcer.  Initially started on cefepime and vancomycin and then later switched to cefazolin and Flagyl per ID. Patient needs amputation but he refused to go to the OR just before transportation.  He was also refusing SNF placement. Family is not comfortable giving IV antibiotics at home. Patient changed his mind again and now wants to proceed with surgery. He was made n.p.o. Dr. Sammuel Bailiff request and he will see if he can take him to the OR later today. -Continue cefazolin and Flagyl for now- -Had a bed offer at [facility]	[place] authorization.  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  MRI lumbar spine, right hip showed the above findings. CK not elevated. --supportive care for now --Flexeril PRN and tramadol PRN.  Poorly controlled diabetes mellitus with hyperglycemia.  Apparently not even taking his home Metformin.  A1c of 12.6.  CBG improved with increasing Levemir. Patient refused to start insulin after discharge. -Continue Levemir 15 units twice daily. -Continue with SSI.  Microcytic anemia with iron and vitamin B12 deficiency. Hemoglobin seems stable. -Continue with iron and B12 supplement.  Glaucoma. -Continue home meds.  Suspect multiple myeloma -increased in urine protein, MRI spine showing possible marrow replacement processes. -SPEP neg for MM.  "pattern appears to be compatible with a chronic inflammatory response."   Objective: Vitals:   11/16/19 0724 11/16/19 1541 11/17/19 0006 11/17/19 0756  BP: 120/74 114/74 116/76 119/75  Pulse: 92 97 91 91  Resp: '19 16 15 17  ' Temp: 97.7 F (36.5 C) 98.5 F (36.9 C) 98 F (36.7 C) 98.7 F (37.1 C)  TempSrc: Oral  Oral Oral  SpO2: 98% 98% 98% 98%  Weight:      Height:        Intake/Output Summary (Last 24 hours) at 11/17/2019 1339 Last data filed at 11/17/2019 1338 Gross per 24 hour  Intake --  Output 2450 ml  Net -2450 ml   Filed Weights   11/05/19 1841  Weight: 86.2 kg    Examination:  General.  Well-developed legally blind gentleman, in no acute distress. Pulmonary.  Lungs clear bilaterally, normal  respiratory effort. CV.  Regular rate and rhythm, no JVD, rub or murmur. Abdomen.  Soft, nontender, nondistended, BS positive. CNS.  Alert and oriented x3.  No focal neurologic deficit. Extremities.  No edema, no cyanosis, right foot with clean bandage. Psychiatry.  Judgment and insight appears normal.  DVT prophylaxis: Lovenox Code Status: Full Family Communication: Discussed with patient. Disposition Plan:   Status is: Inpatient  Remains inpatient appropriate because:Inpatient level of care appropriate due to severity of illness   Dispo: The patient is from: Home              Anticipated d/c is to: SNF              Anticipated d/c date is: 2 days              Patient currently is medically stable for discharge.  Patient might stay for next couple of days if had his surgery done either later today or tomorrow morning.  Keep changing his mind. Had a bed offer-pending insurance authorization.  Consultants:   Podiatry  ID  Procedures:  Antimicrobials:  Cefazolin Flagyl  Data Reviewed: I have personally reviewed following labs and imaging studies  CBC: Recent Labs  Lab 11/11/19 0150 11/12/19 0305 11/13/19 0337 11/14/19 0525 11/15/19 0535  WBC 10.3 10.3 9.6 10.6* 8.4  HGB 9.5* 9.8* 9.3* 10.0* 9.9*  HCT 30.3* 31.2* 29.7* 31.4* 31.4*  MCV 75.0* 74.3* 75.2* 74.8* 74.1*  PLT 597* 641* 588* 648* 855*   Basic Metabolic Panel: Recent Labs  Lab 11/11/19 0150 11/12/19 0305 11/13/19 0337 11/14/19 0525 11/15/19 0535  NA 131* 130* 131* 128* 129*  K 4.7 4.5 4.4 5.2* 5.1  CL 94* 93* 94* 92* 94*  CO2 '30 27 31 28 28  ' GLUCOSE 148* 145* 135* 142* 161*  BUN '12 11 16 15 17  ' CREATININE 1.01 1.00 1.02 1.04 1.02  CALCIUM 7.9* 8.3* 8.1* 8.4* 8.6*  MG 2.2 2.0 2.0 1.9 2.0   GFR: Estimated Creatinine Clearance: 88.4 mL/min (by C-G formula based on SCr of 1.02 mg/dL). Liver Function Tests: No results for input(s): AST, ALT, ALKPHOS, BILITOT, PROT, ALBUMIN in the last 168 hours. No results for input(s): LIPASE, AMYLASE in the last 168 hours. No results for input(s): AMMONIA in the last 168 hours. Coagulation Profile: No results for input(s): INR, PROTIME in the last 168 hours. Cardiac Enzymes: No results for input(s): CKTOTAL, CKMB, CKMBINDEX, TROPONINI in the last 168 hours. BNP (last 3 results) No results for input(s): PROBNP in the last 8760 hours. HbA1C: No results for input(s):  HGBA1C in the last 72 hours. CBG: Recent Labs  Lab 11/16/19 1145 11/16/19 1626 11/16/19 2116 11/17/19 0753 11/17/19 1146  GLUCAP 191* 182* 143* 164* 106*   Lipid Profile: No results for input(s): CHOL, HDL, LDLCALC, TRIG, CHOLHDL, LDLDIRECT in the last 72 hours. Thyroid Function Tests: No results for input(s): TSH, T4TOTAL, FREET4, T3FREE, THYROIDAB in the last 72 hours. Anemia Panel: No results for input(s): VITAMINB12, FOLATE, FERRITIN, TIBC, IRON, RETICCTPCT in the last 72 hours. Sepsis Labs: No results for input(s): PROCALCITON, LATICACIDVEN in the last 168 hours.  Recent Results (from the past 240 hour(s))  Surgical pcr screen     Status: Abnormal   Collection Time: 11/07/19  3:40 PM   Specimen: Nasal Mucosa; Nasal Swab  Result Value Ref Range Status   MRSA, PCR NEGATIVE NEGATIVE Final   Staphylococcus aureus POSITIVE (A) NEGATIVE Final    Comment: (NOTE) The Xpert SA Assay (FDA approved  for NASAL specimens in patients 36 years of age and older), is one component of a comprehensive surveillance program. It is not intended to diagnose infection nor to guide or monitor treatment. Performed at [facility], [address] [place], [place] [address]   Culture, blood (Routine X 2) w Reflex to ID Panel     Status: None   Collection Time: 11/08/19 12:11 AM   Specimen: BLOOD  Result Value Ref Range Status   Specimen Description BLOOD RIGHT ANTECUBITAL  Final   Special Requests   Final    BOTTLES DRAWN AEROBIC AND ANAEROBIC Blood Culture results may not be optimal due to an excessive volume of blood received in culture bottles   Culture   Final    NO GROWTH 5 DAYS Performed at [facility], [address] [place], [place] [address]    Report Status 11/13/2019 FINAL  Final  Culture, blood (Routine X 2) w Reflex to ID Panel     Status: None   Collection Time: 11/08/19 12:17 AM   Specimen: BLOOD  Result Value Ref Range Status   Specimen Description  BLOOD LEFT ANTECUBITAL  Final   Special Requests   Final    BOTTLES DRAWN AEROBIC AND ANAEROBIC Blood Culture results may not be optimal due to an excessive volume of blood received in culture bottles   Culture   Final    NO GROWTH 5 DAYS Performed at [facility], [address] [place], [place] [address]    Report Status 11/13/2019 FINAL  Final  SARS Coronavirus 2 by RT PCR (hospital order, performed in [place] hospital lab) Nasopharyngeal Nasopharyngeal Swab     Status: None   Collection Time: 11/17/19  7:53 AM   Specimen: Nasopharyngeal Swab  Result Value Ref Range Status   SARS Coronavirus 2 NEGATIVE NEGATIVE Final    Comment: (NOTE) SARS-CoV-2 target nucleic acids are NOT DETECTED.  The SARS-CoV-2 RNA is generally detectable in upper and lower respiratory specimens during the acute phase of infection. The lowest concentration of SARS-CoV-2 viral copies this assay can detect is 250 copies / mL. A negative result does not preclude SARS-CoV-2 infection and should not be used as the sole basis for treatment or other patient management decisions.  A negative result may occur with improper specimen collection / handling, submission of specimen other than nasopharyngeal swab, presence of viral mutation(s) within the areas targeted by this assay, and inadequate number of viral copies (<250 copies / mL). A negative result must be combined with clinical observations, patient history, and epidemiological information.  Fact Sheet for Patients:   StrictlyIdeas.no  Fact Sheet for Healthcare Providers: BankingDealers.co.za  This test is not yet approved or  cleared by the [place] FDA and has been authorized for detection and/or diagnosis of SARS-CoV-2 by FDA under an Emergency Use Authorization (EUA).  This EUA will remain in effect (meaning this test can be used) for the duration of the COVID-19 declaration under Section  564(b)(1) of the Act, 21 U.S.C. section 360bbb-3(b)(1), unless the authorization is terminated or revoked sooner.  Performed at [facility], [address] [place], [place] [address]      Radiology Studies: No results found.  Scheduled Meds: . brimonidine  1 drop Both Eyes BID  . chlorhexidine  60 mL Topical Once  . Chlorhexidine Gluconate Cloth  6 each Topical Daily  . Chlorhexidine Gluconate Cloth  6 each Topical Daily  . cholecalciferol  1,000 Units Oral Daily  .  cyclobenzaprine  10 mg Oral TID  . enoxaparin (LOVENOX) injection  40 mg Subcutaneous Q24H  . insulin aspart  0-15 Units Subcutaneous TID WC  . insulin detemir  15 Units Subcutaneous BID  . iron polysaccharides  150 mg Oral Daily  . mupirocin ointment  1 application Nasal BID  . povidone-iodine  2 application Topical Once  . sodium chloride flush  10-40 mL Intracatheter Q12H  . timolol  1 drop Both Eyes BID  . vitamin B-12  1,000 mcg Oral Daily   Continuous Infusions: . sodium chloride 250 mL (11/11/19 1415)  .  ceFAZolin (ANCEF) IV 2 g (11/17/19 1338)  . metronidazole 500 mg (11/17/19 1005)     LOS: 12 days   Time spent: 25 minutes  Lorella Nimrod, MD [facility]  If 7PM-7AM, please contact night-coverage [email or web address]  11/17/2019, 1:39 PM   This record has been created using Systems analyst. Errors have been sought and corrected,but may not always be located. Such creation errors do not reflect on the standard of care.

## 2019-11-17 NOTE — Consult Note (Signed)
WOC Nurse Consult Note: Reason for Consult: RGT ulceration.  Patient is followed by Podiatric Medicine (Dr. Ether Griffins). Patient with known osteomyelitis. Wound type:Infectious Pressure Injury POA:N/A Measurement: To be obtained by bedside RN and documented on Nursing Flow Sheet  Wound bed: Discolored lesion on lateral aspect of RGT with dark center Drainage (amount, consistency, odor) None Periwound:Dry Dressing procedure/placement/frequency: I have provided conservative care guidance for topical care of this lesion using xeroform gauze (an antimicrobial nonadherent) applied once daily after cleansing. A pressure redistribution heel boot is used to prevent pressure or other injury.   Recommend reconsult with Dr. Ether Griffins for continuing care of this foot (digit).  WOC nursing team will not follow, but will remain available to this patient, the nursing and medical teams.  Please re-consult if needed. Thanks, Ladona Mow, MSN, RN, GNP, [place]  Pager# [phone]

## 2019-11-17 NOTE — Plan of Care (Signed)

## 2019-11-17 NOTE — Op Note (Signed)
Date of operation: 11/17/2019.  Preoperative diagnosis: Osteomyelitis right first metatarsal.  Postoperative diagnosis: Same.  Procedure: Amputation right hallux with first ray resection.  Anesthesia: LMA.  Hemostasis: Pneumatic tourniquet right ankle 250 mmHg.  Estimated blood loss: Less than 5 cc.  Cultures: Bone culture right first metatarsal.  Pathology: Right first ray.  Injectables: 10 cc 0.5% Marcaine plain.  Implants: None.  Complications: None apparent.  Operative indications: This is a 59 year old male with osteomyelitis in his right first metatarsal.  After a couple of previous attempts last week patient finally agrees for amputation of the right great toe with resection of the first ray to try to help remove his infection.  Operative procedure: Patient was taken the operating room and placed on the table in the supine position.  Following satisfactory LMA anesthesia a pneumatic tourniquet was applied at the level of the right ankle and the foot was prepped and draped in the usual sterile fashion.  The foot was exsanguinated and the tourniquet inflated to 250 mmHg.     Attention was then directed to the right foot where an incision was made dorsally and laterally over the base of the great toe connecting medially and then encompassing the ulceration on the medial aspect of the first metatarsal and then joint proximally and carried back to the base of the first metatarsal.  The incision was carried sharply down to the level of the bone and periosteal dissection carried proximally to the first metatarsocuneiform joint.  The first metatarsal was then freed from all normal surrounding anatomy and was removed in toe toe.  A sample of bone from the first metatarsal was taken for cultures and the toe was passed off the table for pathology.  The entire wound was then thoroughly debrided with a versa jet debrider on a setting of 3 and then irrigated with sterile saline using a bulb  syringe.  The wound was then reapproximated and closed using 3-0 nylon vertical mattress and simple interrupted sutures.  10 cc of 0.5% Marcaine plain was then injected for postoperative analgesia.  Betadine applied to the incision line followed by Xeroform 4 x 4's ABDs and Kerlix.  The tourniquet was released and a second Kerlix and Ace wrap applied for compression.  The patient was awakened and transported to the PACU with vital signs stable and in good condition.

## 2019-11-17 NOTE — Progress Notes (Signed)
9 Days Post-Op   Subjective/Chief Complaint: Patient seen for evaluation of amputation of his right great toe and first metatarsal due to osteomyelitis.  Patient was scheduled for discharge to skilled nursing tomorrow and he has finally agreed to consent for amputation of the right great toe and first metatarsal.  Has previously declined to have this performed on 2 occasions with Dr. Ether Griffins.   Objective: Vital signs in last 24 hours: Temp:  [98 F (36.7 C)-98.8 F (37.1 C)] 98.8 F (37.1 C) (10/24 1528) Pulse Rate:  [91-96] 96 (10/24 1528) Resp:  [15-17] 17 (10/24 1528) BP: (116-121)/(73-76) 121/73 (10/24 1528) SpO2:  [96 %-98 %] 96 % (10/24 1528) Last BM Date: 11/16/19  Intake/Output from previous day: 10/23 0701 - 10/24 0700 In: 230 [P.O.:220; I.V.:10] Out: 1650 [Urine:1650] Intake/Output this shift: Total I/O In: -  Out: 1250 [Urine:1250]  Bandage intact on the right foot.  X-ray and MRI findings reveal significant osteomyelitis involving the entire first metatarsal with collapse of the first metatarsal head.  Lab Results:  Recent Labs    11/15/19 0535  WBC 8.4  HGB 9.9*  HCT 31.4*  PLT 629*   BMET Recent Labs    11/15/19 0535  NA 129*  K 5.1  CL 94*  CO2 28  GLUCOSE 161*  BUN 17  CREATININE 1.02  CALCIUM 8.6*   PT/INR No results for input(s): LABPROT, INR in the last 72 hours. ABG No results for input(s): PHART, HCO3 in the last 72 hours.  Invalid input(s): PCO2, PO2  Studies/Results: No results found.  Anti-infectives: Anti-infectives (From admission, onward)   Start     Dose/Rate Route Frequency Ordered Stop   11/07/19 1715  metroNIDAZOLE (FLAGYL) IVPB 500 mg        500 mg 100 mL/hr over 60 Minutes Intravenous Every 8 hours 11/07/19 1622     11/06/19 1400  ceFAZolin (ANCEF) IVPB 2g/100 mL premix        2 g 200 mL/hr over 30 Minutes Intravenous Every 8 hours 11/06/19 0925     11/06/19 0900  vancomycin (VANCOCIN) IVPB 1000 mg/200 mL premix   Status:  Discontinued        1,000 mg 200 mL/hr over 60 Minutes Intravenous Every 12 hours 11/06/19 0110 11/06/19 0757   11/06/19 0900  vancomycin (VANCOREADY) IVPB 1500 mg/300 mL  Status:  Discontinued        1,500 mg 150 mL/hr over 120 Minutes Intravenous Every 12 hours 11/06/19 0757 11/06/19 0925   11/06/19 0800  ceFEPIme (MAXIPIME) 2 g in sodium chloride 0.9 % 100 mL IVPB  Status:  Discontinued        2 g 200 mL/hr over 30 Minutes Intravenous Every 8 hours 11/06/19 0107 11/06/19 0925   11/06/19 0230  ceFEPIme (MAXIPIME) 2 g in sodium chloride 0.9 % 100 mL IVPB  Status:  Discontinued        2 g 200 mL/hr over 30 Minutes Intravenous  Once 11/06/19 0228 11/06/19 0232   11/06/19 0230  vancomycin (VANCOCIN) IVPB 1000 mg/200 mL premix  Status:  Discontinued        1,000 mg 200 mL/hr over 60 Minutes Intravenous  Once 11/06/19 0228 11/06/19 0234   11/05/19 2345  ceFEPIme (MAXIPIME) 2 g in sodium chloride 0.9 % 100 mL IVPB        2 g 200 mL/hr over 30 Minutes Intravenous  Once 11/05/19 2324 11/06/19 0222   11/05/19 2330  vancomycin (VANCOCIN) IVPB 1000 mg/200 mL premix  Status:  Discontinued        1,000 mg 200 mL/hr over 60 Minutes Intravenous  Once 11/05/19 2324 11/05/19 2326   11/05/19 2115  vancomycin (VANCOCIN) IVPB 1000 mg/200 mL premix        1,000 mg 200 mL/hr over 60 Minutes Intravenous  Once 11/05/19 2112 11/05/19 2227      Assessment/Plan: s/p Procedure(s): AMPUTATION RIGHT 1ST RAY (Right) Assessment: Osteomyelitis right first metatarsal.   Plan: Discussed with the patient again the need for amputation of his right great toe with ray resection to remove the infected first metatarsal.  Discussed possible risks and complications of the procedure including inability of the foot to heal due to persistent infection or his diabetes or vascular status.  No guarantees can be given as to the outcome of the procedure.  Discussed with the patient since surgery has been delayed for over a  week that the chances of expanded infection could result in further loss of his foot or possibly lower leg.  Patient agrees to have surgery performed this evening.  Consent form for amputation right first ray with amputation of the hallux.  Patient is n.p.o.  Plan for surgery this evening.  LOS: 12 days    Ricci Barker 11/17/2019

## 2019-11-17 NOTE — Interval H&P Note (Signed)
History and Physical Interval Note:  11/17/2019 11:47 PM  Gary Sutton  has presented today for surgery, with the diagnosis of Osteomyelitis.  The various methods of treatment have been discussed with the patient and family. After consideration of risks, benefits and other options for treatment, the patient has consented to  Procedure(s): Right first ray resection (Right) as a surgical intervention.  The patient's history has been reviewed, patient examined, no change in status, stable for surgery.  I have reviewed the patient's chart and labs.  Questions were answered to the patient's satisfaction.     Ricci Barker

## 2019-11-18 ENCOUNTER — Encounter: Payer: Self-pay | Admitting: Podiatry

## 2019-11-18 DIAGNOSIS — R7881 Bacteremia: Secondary | ICD-10-CM | POA: Diagnosis not present

## 2019-11-18 DIAGNOSIS — E119 Type 2 diabetes mellitus without complications: Secondary | ICD-10-CM | POA: Diagnosis not present

## 2019-11-18 DIAGNOSIS — M8610 Other acute osteomyelitis, unspecified site: Secondary | ICD-10-CM | POA: Diagnosis not present

## 2019-11-18 DIAGNOSIS — B9561 Methicillin susceptible Staphylococcus aureus infection as the cause of diseases classified elsewhere: Secondary | ICD-10-CM | POA: Diagnosis not present

## 2019-11-18 LAB — CBC
HCT: 31.8 % — ABNORMAL LOW (ref 39.0–52.0)
Hemoglobin: 10.1 g/dL — ABNORMAL LOW (ref 13.0–17.0)
MCH: 24.8 pg — ABNORMAL LOW (ref 26.0–34.0)
MCHC: 31.8 g/dL (ref 30.0–36.0)
MCV: 78.1 fL — ABNORMAL LOW (ref 80.0–100.0)
Platelets: 726 10*3/uL — ABNORMAL HIGH (ref 150–400)
RBC: 4.07 MIL/uL — ABNORMAL LOW (ref 4.22–5.81)
RDW: 14.7 % (ref 11.5–15.5)
WBC: 8.8 10*3/uL (ref 4.0–10.5)
nRBC: 0.3 % — ABNORMAL HIGH (ref 0.0–0.2)

## 2019-11-18 LAB — POTASSIUM
Potassium: 5.9 mmol/L — ABNORMAL HIGH (ref 3.5–5.1)
Potassium: 5.1 mmol/L (ref 3.5–5.1)

## 2019-11-18 LAB — BASIC METABOLIC PANEL
Anion gap: 7 (ref 5–15)
BUN: 26 mg/dL — ABNORMAL HIGH (ref 8–23)
CO2: 22 mmol/L (ref 22–32)
Calcium: 8.3 mg/dL — ABNORMAL LOW (ref 8.9–10.3)
Chloride: 97 mmol/L — ABNORMAL LOW (ref 98–111)
Creatinine, Ser: 1.18 mg/dL (ref 0.61–1.24)
GFR, Estimated: >60 mL/min (ref >60)
Glucose, Bld: 265 mg/dL — ABNORMAL HIGH (ref 70–99)
Potassium: 6 mmol/L — ABNORMAL HIGH (ref 3.5–5.1)
Sodium: 126 mmol/L — ABNORMAL LOW (ref 135–145)

## 2019-11-18 LAB — GLUCOSE, CAPILLARY
Glucose-Capillary: 239 mg/dL — ABNORMAL HIGH (ref 70–99)
Glucose-Capillary: 191 mg/dL — ABNORMAL HIGH (ref 70–99)
Glucose-Capillary: 298 mg/dL — ABNORMAL HIGH (ref 70–99)
Glucose-Capillary: 147 mg/dL — ABNORMAL HIGH (ref 70–99)

## 2019-11-18 LAB — NA AND K (SODIUM & POTASSIUM), RAND UR
Potassium Urine: 59 mmol/L
Sodium, Ur: 58 mmol/L

## 2019-11-18 LAB — OSMOLALITY, URINE: Osmolality, Ur: 435 mOsm/kg (ref 300–900)

## 2019-11-18 MED ORDER — SODIUM CHLORIDE 0.9% FLUSH
3.0000 mL | Freq: Two times a day (BID) | INTRAVENOUS | Status: DC
  Administered 2019-11-18: 3 mL via INTRAVENOUS

## 2019-11-18 MED ORDER — SODIUM CHLORIDE 0.9 % IV SOLN: 250.0000 mL | INTRAVENOUS | Status: DC | PRN

## 2019-11-18 MED ORDER — SODIUM CHLORIDE 0.9 % IV BOLUS: 500.0000 mL | Freq: Once | INTRAVENOUS | Status: DC

## 2019-11-18 MED ORDER — CALCIUM GLUCONATE 10 % IV SOLN
1.0000 g | Freq: Once | INTRAVENOUS | Status: AC
  Administered 2019-11-18: 1 g via INTRAVENOUS
  Filled 2019-11-18: qty 10

## 2019-11-18 MED ORDER — SODIUM ZIRCONIUM CYCLOSILICATE 10 G PO PACK
10.0000 g | PACK | Freq: Every day | ORAL | Status: DC
  Administered 2019-11-18: 10 g via ORAL
  Filled 2019-11-18 (×2): qty 1

## 2019-11-18 MED ORDER — PROMETHAZINE HCL 25 MG PO TABS
12.5000 mg | ORAL_TABLET | ORAL | Status: DC | PRN
  Filled 2019-11-18: qty 1

## 2019-11-18 MED ORDER — INSULIN ASPART 100 UNIT/ML IV SOLN
10.0000 [IU] | Freq: Once | INTRAVENOUS | Status: AC
  Administered 2019-11-18: 10 [IU] via INTRAVENOUS
  Filled 2019-11-18: qty 0.1

## 2019-11-18 MED ORDER — SODIUM CHLORIDE 0.9% FLUSH: 3.0000 mL | INTRAVENOUS | Status: DC | PRN

## 2019-11-18 MED ORDER — SODIUM ZIRCONIUM CYCLOSILICATE 10 G PO PACK
10.0000 g | PACK | Freq: Once | ORAL | Status: AC
  Administered 2019-11-18: 10 g via ORAL
  Filled 2019-11-18: qty 1

## 2019-11-18 MED ORDER — OXYCODONE-ACETAMINOPHEN 5-325 MG PO TABS
1.0000 | ORAL_TABLET | ORAL | Status: DC | PRN
  Administered 2019-11-18 (×2): 1 via ORAL
  Administered 2019-11-19 – 2019-11-20 (×4): 2 via ORAL
  Filled 2019-11-18: qty 1
  Filled 2019-11-18 (×2): qty 2
  Filled 2019-11-18: qty 1
  Filled 2019-11-18 (×2): qty 2

## 2019-11-18 MED ORDER — SODIUM CHLORIDE 0.9 % IV BOLUS
500.0000 mL | Freq: Once | INTRAVENOUS | Status: AC
  Administered 2019-11-18: 500 mL via INTRAVENOUS

## 2019-11-18 NOTE — Progress Notes (Addendum)
PROGRESS NOTE    Gary Sutton  URK:[id number] DOB: [birth year] DOA: 11/05/2019 PCP: Patient, No Pcp Per   Brief Narrative:  Gary Sutton y.o.malewith a known history of type 2 diabetes mellitus, glaucoma and degenerative disc disease, who presented to the emergency room with acute onset of right lower extremity pain involving his leg with associated right foot swelling with erythema at the big toe. Patient was admitted for sepsis secondary to right big toe diabetic ulcer and osteomyelitis along with MSSA bacteremia. Podiatry was consulted but patient refused amputation. Patient is legally blind.  Now agrees to go to SNF as family is not comfortable providing him with IV antibiotics at home.  Had a bed offer-waiting for insurance authorization. 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 Cleda Sutton.  Subjective: Patient was feeling better when seen today. Tolerated the procedure well. He was telling me that he did not feel anything, with just put a mask on and next moment when he was awake it was done. According to patient he was refusing surgery before that he was afraid that somebody would cut his foot with a knife and that will be very painful. No pain during morning rounds as he did received pain medications little while ago.  Assessment & Plan:   Active Problems:   Osteomyelitis (HCC)  Sepsis secondary to MSSA bacteremia due to right big toe osteomyelitis and diabetic ulcer.  Initially started on cefepime and vancomycin and then later switched to cefazolin and Flagyl per ID. Patient agreed to proceed with amputation yesterday and had his right hallux amputation with first ray resection yesterday evening, tolerated the procedure well. -Continue cefazolin and Flagyl for now-we will ask ID about the duration of antibiotics as he had his amputation now. -Had a bed offer at [facility] authorization.  Hyperkalemia.  Labs came back with hyperkalemia, potassium of 6. -Get stat EKG-peaked T waves inferior leads. -Give him 1 g of calcium gluconate. -Give him a bolus of normal saline 500 cc -10 units of NovoLog. -Lokelma 10 g -Monitor potassium. -Continue with cardiac monitor  Hyponatremia.  Corrected sodium of 129.  Appears close to his baseline.  Patient has chronic hyponatremia. -We will get hyponatremia labs. -Giving him normal saline. 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  MRI lumbar spine, right hip showed the above findings. CK not elevated. --supportive care for now --Flexeril PRN and tramadol PRN.  Poorly controlled diabetes mellitus with hyperglycemia.  Apparently not even taking his home Metformin.  A1c of 12.6.  CBG improved with increasing Levemir. Patient refused to start insulin after discharge. -Continue Levemir 15 units twice daily. -Continue with SSI.  Microcytic anemia with iron and vitamin B12 deficiency. Hemoglobin seems stable. -Continue with iron and B12 supplement.  Glaucoma. -Continue home meds.  Suspect multiple myeloma -increased in urine protein, MRI spine showing possible marrow replacement processes. -SPEP neg for MM.  "pattern appears to be compatible with a chronic inflammatory response."   Objective: Vitals:   11/18/19 0144 11/18/19 0334 11/18/19 0442 11/18/19 0723  BP: 121/73 129/74 124/69 113/66  Pulse: 88 90 95 98  Resp: '16 20 19 18  ' Temp: 98.7 F (37.1 C) 98.7 F (37.1 C) 98.8 F (37.1  C) 98.6 F (37 C)  TempSrc: Oral Oral Oral Oral  SpO2: 97% 96% 98% 98%  Weight:      Height:        Intake/Output Summary (Last 24 hours) at 11/18/2019 0748 Last data filed at 11/18/2019 0446 Gross per 24 hour  Intake 600  ml  Output 2055 ml  Net -1455 ml   Filed Weights   11/05/19 1841  Weight: 86.2 kg    Examination:  General. Well-developed gentleman, in no acute distress. Pulmonary.  Lungs clear bilaterally, normal respiratory effort. CV.  Regular rate and rhythm, no JVD, rub or murmur. Abdomen.  Soft, nontender, nondistended, BS positive. CNS.  Alert and oriented x3.  No focal neurologic deficit. Extremities.  No edema, no cyanosis, pulses intact and symmetrical, right foot with clean bandage and Ace wrap. Psychiatry.  Judgment and insight appears normal.  DVT prophylaxis: Lovenox Code Status: Full Family Communication: Discussed with patient. Disposition Plan:  Status is: Inpatient  Remains inpatient appropriate because:Inpatient level of care appropriate due to severity of illness   Dispo: The patient is from: Home              Anticipated d/c is to: SNF              Anticipated d/c date is: 1-2 days, had his first toe amputation yesterday, should be able to go to SNF tomorrow if no other complication.              Had a bed offer-pending insurance authorization.  Consultants:   Podiatry  ID  Procedures:  Right hallux amputation with first ray resection on 11/17/2019.  Antimicrobials:  Cefazolin Flagyl  Data Reviewed: I have personally reviewed following labs and imaging studies  CBC: Recent Labs  Lab 11/12/19 0305 11/13/19 0337 11/14/19 0525 11/15/19 0535  WBC 10.3 9.6 10.6* 8.4  HGB 9.8* 9.3* 10.0* 9.9*  HCT 31.2* 29.7* 31.4* 31.4*  MCV 74.3* 75.2* 74.8* 74.1*  PLT 641* 588* 648* 332*   Basic Metabolic Panel: Recent Labs  Lab 11/12/19 0305 11/13/19 0337 11/14/19 0525 11/15/19 0535 11/17/19 2040  NA 130* 131* 128* 129* 131*  K 4.5 4.4 5.2* 5.1 5.2*  CL 93* 94* 92* 94* 95*  CO2 '27 31 28 28 30  ' GLUCOSE 145* 135* 142* 161* 107*  BUN '11 16 15 17 22  ' CREATININE 1.00 1.02 1.04 1.02 1.01  CALCIUM 8.3* 8.1* 8.4* 8.6* 8.8*  MG 2.0 2.0 1.9 2.0 2.0    GFR: Estimated Creatinine Clearance: 89.3 mL/min (by C-G formula based on SCr of 1.01 mg/dL). Liver Function Tests: Recent Labs  Lab 11/17/19 2040  AST 19  ALT 7  ALKPHOS 64  BILITOT 0.4  PROT 9.3*  ALBUMIN 2.4*   No results for input(s): LIPASE, AMYLASE in the last 168 hours. No results for input(s): AMMONIA in the last 168 hours. Coagulation Profile: Recent Labs  Lab 11/17/19 2040  INR 1.3*   Cardiac Enzymes: No results for input(s): CKTOTAL, CKMB, CKMBINDEX, TROPONINI in the last 168 hours. BNP (last 3 results) No results for input(s): PROBNP in the last 8760 hours. HbA1C: No results for input(s): HGBA1C in the last 72 hours. CBG: Recent Labs  Lab 11/17/19 1146 11/17/19 1631 11/17/19 2042 11/17/19 2352 11/18/19 0724  GLUCAP 106* 151* 83 129* 239*   Lipid Profile: No results for input(s): CHOL, HDL, LDLCALC, TRIG, CHOLHDL, LDLDIRECT in the last 72 hours. Thyroid Function Tests: No results for input(s): TSH, T4TOTAL, FREET4, T3FREE, THYROIDAB in  the last 72 hours. Anemia Panel: No results for input(s): VITAMINB12, FOLATE, FERRITIN, TIBC, IRON, RETICCTPCT in the last 72 hours. Sepsis Labs: No results for input(s): PROCALCITON, LATICACIDVEN in the last 168 hours.  Recent Results (from the past 240 hour(s))  SARS Coronavirus 2 by RT PCR (hospital order, performed in [facility] hospital lab) Nasopharyngeal Nasopharyngeal Swab     Status: None   Collection Time: 11/17/19  7:53 AM   Specimen: Nasopharyngeal Swab  Result Value Ref Range Status   SARS Coronavirus 2 NEGATIVE NEGATIVE Final    Comment: (NOTE) SARS-CoV-2 target nucleic acids are NOT DETECTED.  The SARS-CoV-2 RNA is generally detectable in upper and lower respiratory specimens during the acute phase of infection. The lowest concentration of SARS-CoV-2 viral copies this assay can detect is 250 copies / mL. A negative result does not preclude SARS-CoV-2 infection and should not be used as the sole  basis for treatment or other patient management decisions.  A negative result may occur with improper specimen collection / handling, submission of specimen other than nasopharyngeal swab, presence of viral mutation(s) within the areas targeted by this assay, and inadequate number of viral copies (<250 copies / mL). A negative result must be combined with clinical observations, patient history, and epidemiological information.  Fact Sheet for Patients:   StrictlyIdeas.no  Fact Sheet for Healthcare Providers: BankingDealers.co.za  This test is not yet approved or  cleared by the [place] FDA and has been authorized for detection and/or diagnosis of SARS-CoV-2 by FDA under an Emergency Use Authorization (EUA).  This EUA will remain in effect (meaning this test can be used) for the duration of the COVID-19 declaration under Section 564(b)(1) of the Act, 21 U.S.C. section 360bbb-3(b)(1), unless the authorization is terminated or revoked sooner.  Performed at [facility], [address], [place], [place] [address]      Radiology Studies: No results found.  Scheduled Meds: . brimonidine  1 drop Both Eyes BID  . Chlorhexidine Gluconate Cloth  6 each Topical Daily  . Chlorhexidine Gluconate Cloth  6 each Topical Daily  . cholecalciferol  1,000 Units Oral Daily  . cyclobenzaprine  10 mg Oral TID  . enoxaparin (LOVENOX) injection  40 mg Subcutaneous Q24H  . insulin aspart  0-15 Units Subcutaneous TID WC  . insulin detemir  15 Units Subcutaneous BID  . iron polysaccharides  150 mg Oral Daily  . sodium chloride flush  10-40 mL Intracatheter Q12H  . sodium chloride flush  3 mL Intravenous Q12H  . timolol  1 drop Both Eyes BID  . vitamin B-12  1,000 mcg Oral Daily   Continuous Infusions: . sodium chloride 250 mL (11/11/19 1415)  . sodium chloride    .  ceFAZolin (ANCEF) IV 2 g (11/18/19 0455)  . metronidazole 500 mg  (11/18/19 0101)     LOS: 13 days   Time spent: 25 minutes  Lorella Nimrod, MD [facility]  If 7PM-7AM, please contact night-coverage [email or web address]  11/18/2019, 7:48 AM   This record has been created using Systems analyst. Errors have been sought and corrected,but may not always be located. Such creation errors do not reflect on the standard of care.

## 2019-11-18 NOTE — Plan of Care (Signed)

## 2019-11-18 NOTE — Progress Notes (Signed)
Pt returned from surgery at this time. Pt alert and eating crackers and drinking water. Son, Cristal Deer, updated on phone.

## 2019-11-18 NOTE — Progress Notes (Signed)
1 Day Post-Op   Subjective/Chief Complaint: Patient seen.  Some pain but controlled with medication.   Objective: Vital signs in last 24 hours: Temp:  [98.1 F (36.7 C)-98.8 F (37.1 C)] 98.6 F (37 C) (10/25 0723) Pulse Rate:  [87-98] 98 (10/25 0723) Resp:  [13-20] 18 (10/25 0723) BP: (113-129)/(66-75) 113/66 (10/25 0723) SpO2:  [96 %-100 %] 98 % (10/25 0723) Last BM Date: 11/16/19  Intake/Output from previous day: 10/24 0701 - 10/25 0700 In: 600 [I.V.:500; IV Piggyback:100] Out: 2055 [Urine:2050; Blood:5] Intake/Output this shift: Total I/O In: 0  Out: 850 [Urine:850]  Bandage on the right foot is dry and intact.  No evidence of strikethrough.  Lab Results:  Recent Labs    11/18/19 1002  WBC 8.8  HGB 10.1*  HCT 31.8*  PLT 726*   BMET Recent Labs    11/17/19 2040 11/18/19 1002  NA 131* 126*  K 5.2* 6.0*  CL 95* 97*  CO2 30 22  GLUCOSE 107* 265*  BUN 22 26*  CREATININE 1.01 1.18  CALCIUM 8.8* 8.3*   PT/INR Recent Labs    11/17/19 2040  LABPROT 16.0*  INR 1.3*   ABG No results for input(s): PHART, HCO3 in the last 72 hours.  Invalid input(s): PCO2, PO2  Studies/Results: No results found.  Anti-infectives: Anti-infectives (From admission, onward)   Start     Dose/Rate Route Frequency Ordered Stop   11/07/19 1715  metroNIDAZOLE (FLAGYL) IVPB 500 mg  Status:  Discontinued        500 mg 100 mL/hr over 60 Minutes Intravenous Every 8 hours 11/07/19 1622 11/18/19 1107   11/06/19 1400  ceFAZolin (ANCEF) IVPB 2g/100 mL premix        2 g 200 mL/hr over 30 Minutes Intravenous Every 8 hours 11/06/19 0925     11/06/19 0900  vancomycin (VANCOCIN) IVPB 1000 mg/200 mL premix  Status:  Discontinued        1,000 mg 200 mL/hr over 60 Minutes Intravenous Every 12 hours 11/06/19 0110 11/06/19 0757   11/06/19 0900  vancomycin (VANCOREADY) IVPB 1500 mg/300 mL  Status:  Discontinued        1,500 mg 150 mL/hr over 120 Minutes Intravenous Every 12 hours 11/06/19  0757 11/06/19 0925   11/06/19 0800  ceFEPIme (MAXIPIME) 2 g in sodium chloride 0.9 % 100 mL IVPB  Status:  Discontinued        2 g 200 mL/hr over 30 Minutes Intravenous Every 8 hours 11/06/19 0107 11/06/19 0925   11/06/19 0230  ceFEPIme (MAXIPIME) 2 g in sodium chloride 0.9 % 100 mL IVPB  Status:  Discontinued        2 g 200 mL/hr over 30 Minutes Intravenous  Once 11/06/19 0228 11/06/19 0232   11/06/19 0230  vancomycin (VANCOCIN) IVPB 1000 mg/200 mL premix  Status:  Discontinued        1,000 mg 200 mL/hr over 60 Minutes Intravenous  Once 11/06/19 0228 11/06/19 0234   11/05/19 2345  ceFEPIme (MAXIPIME) 2 g in sodium chloride 0.9 % 100 mL IVPB        2 g 200 mL/hr over 30 Minutes Intravenous  Once 11/05/19 2324 11/06/19 0222   11/05/19 2330  vancomycin (VANCOCIN) IVPB 1000 mg/200 mL premix  Status:  Discontinued        1,000 mg 200 mL/hr over 60 Minutes Intravenous  Once 11/05/19 2324 11/05/19 2326   11/05/19 2115  vancomycin (VANCOCIN) IVPB 1000 mg/200 mL premix  1,000 mg 200 mL/hr over 60 Minutes Intravenous  Once 11/05/19 2112 11/05/19 2227      Assessment/Plan: s/p Procedure(s): Right first ray resection (Right) Assessment: Stable status post first ray resection right foot.   Plan: Dressing left intact.  Plan for dressing change tomorrow morning.  LOS: 13 days    Ricci Barker 11/18/2019

## 2019-11-18 NOTE — Anesthesia Postprocedure Evaluation (Signed)
Anesthesia Post Note  Patient: Gary Sutton  Procedure(s) Performed: Right first ray resection (Right )  Patient location during evaluation: PACU Anesthesia Type: General Level of consciousness: awake and alert and oriented Pain management: pain level controlled Vital Signs Assessment: post-procedure vital signs reviewed and stable Respiratory status: spontaneous breathing Cardiovascular status: blood pressure returned to baseline Anesthetic complications: no   No complications documented.   Last Vitals:  Vitals:   11/18/19 0442 11/18/19 0723  BP: 124/69 113/66  Pulse: 95 98  Resp: 19 18  Temp: 37.1 C 37 C  SpO2: 98% 98%    Last Pain:  Vitals:   11/18/19 0723  TempSrc: Oral  PainSc:                  Benyamin Jeff

## 2019-11-18 NOTE — TOC Progression Note (Addendum)
Transition of Care [facility] - Progression Note    Patient Details  Name: Gary Sutton MRN: [id number] Date of Birth: 26-Nov-1960  [facility]) CM/SW Contact  Liliana Cline, LCSW Phone Number: 11/18/2019, 9:57 AM  Clinical Narrative:   Call from [facility] with [facility] who reported now they have to rescind their bed offer due to patient being blind.   Patient agreed to surgery over the weekend. MD to order updated PT evals.       Expected Discharge Plan and Services                                                 Social Determinants of Health (SDOH) Interventions    Readmission Risk Interventions No flowsheet data found.

## 2019-11-18 NOTE — Progress Notes (Signed)
ID    Date of Admission:  11/05/2019      ID: Gary Sutton is a 59 y.o. male  Active Problems:   Osteomyelitis (HCC)  MSSA bacteremia  Subjective: Had great toe amputation yeserday and says doing fine Feeling better  Medications:  . brimonidine  1 drop Both Eyes BID  . Chlorhexidine Gluconate Cloth  6 each Topical Daily  . cholecalciferol  1,000 Units Oral Daily  . cyclobenzaprine  10 mg Oral TID  . enoxaparin (LOVENOX) injection  40 mg Subcutaneous Q24H  . insulin aspart  0-15 Units Subcutaneous TID WC  . insulin detemir  15 Units Subcutaneous BID  . iron polysaccharides  150 mg Oral Daily  . sodium chloride flush  10-40 mL Intracatheter Q12H  . sodium chloride flush  3 mL Intravenous Q12H  . timolol  1 drop Both Eyes BID  . vitamin B-12  1,000 mcg Oral Daily    Objective: Vital signs in last 24 hours: Temp:  [98.1 F (36.7 C)-98.8 F (37.1 C)] 98.6 F (37 C) (10/25 0723) Pulse Rate:  [87-98] 98 (10/25 0723) Resp:  [13-20] 18 (10/25 0723) BP: (113-129)/(66-75) 113/66 (10/25 0723) SpO2:  [96 %-100 %] 98 % (10/25 0723)  PHYSICAL EXAM:  General: Alert, cooperative, no distress, appears stated age.  Chest b/l air entry Hs-[id number] abd soft Rt foot dressing not removed Lab Results Recent Labs    11/17/19 2040 11/18/19 1002  WBC  --  8.8  HGB  --  10.1*  HCT  --  31.8*  NA 131* 126*  K 5.2* 6.0*  CL 95* 97*  CO2 30 22  BUN 22 26*  CREATININE 1.01 1.18   Liver Panel Recent Labs    11/17/19 2040  PROT 9.3*  ALBUMIN 2.4*  AST 19  ALT 7  ALKPHOS 64  BILITOT 0.4   Sedimentation Rate No results for input(s): ESRSEDRATE in the last 72 hours. C-Reactive Protein No results for input(s): CRP in the last 72 hours.  Microbiology:  Studies/Results: No results found.   Assessment/Plan: MSSA bacteremia - on cefazolin , repeat blood culture negative 2 d echo N Secondary to rt toe infection with osteomyelitis-. MRI showed Soft tissue ulcer along the  medial first MTP joint with sinus tract extending to the first metatarsal head. Underlying first MTP joint septic arthritis with osteomyelitis of the first proximal phalanx, first metatarsal, and medial and lateral hallux sesamoids Superficial culture was MSSA, strep and kleb- all sensitive to cefazolin  Pt after refusing great toe amputation for 2 weeks finally had it done on 11/17/19 Will need Iv cefazolin until 12/17/19  Newly diagnosed DM  Blind in left eye Partial vision rt eye  Rt hip /upper thigh pain with decreased mobility- improving--MRI showed Prominent edema in the right gluteus minimus muscle, concerning for myositis- 3. Moderate bilateral hip osteoarthritis. Trace right hip joint.effusion with 7 mm intra-articular body. 4. Mild right greater than left greater trochanteric bursitis.  Myositis rt gluteus minimus --likely from infection- improving with antibiotics  Anemia - MM ruled out by normal SPEP  Discussed the management with the patient

## 2019-11-19 LAB — GLUCOSE, CAPILLARY
Glucose-Capillary: 153 mg/dL — ABNORMAL HIGH (ref 70–99)
Glucose-Capillary: 124 mg/dL — ABNORMAL HIGH (ref 70–99)
Glucose-Capillary: 132 mg/dL — ABNORMAL HIGH (ref 70–99)
Glucose-Capillary: 125 mg/dL — ABNORMAL HIGH (ref 70–99)

## 2019-11-19 LAB — BASIC METABOLIC PANEL
Anion gap: 6 (ref 5–15)
BUN: 26 mg/dL — ABNORMAL HIGH (ref 8–23)
CO2: 28 mmol/L (ref 22–32)
Calcium: 8.6 mg/dL — ABNORMAL LOW (ref 8.9–10.3)
Chloride: 96 mmol/L — ABNORMAL LOW (ref 98–111)
Creatinine, Ser: 1.05 mg/dL (ref 0.61–1.24)
GFR, Estimated: >60 mL/min (ref >60)
Glucose, Bld: 135 mg/dL — ABNORMAL HIGH (ref 70–99)
Potassium: 4.8 mmol/L (ref 3.5–5.1)
Sodium: 130 mmol/L — ABNORMAL LOW (ref 135–145)

## 2019-11-19 LAB — SURGICAL PATHOLOGY

## 2019-11-19 LAB — CBC
HCT: 28.7 % — ABNORMAL LOW (ref 39.0–52.0)
Hemoglobin: 9 g/dL — ABNORMAL LOW (ref 13.0–17.0)
MCH: 23.4 pg — ABNORMAL LOW (ref 26.0–34.0)
MCHC: 31.4 g/dL (ref 30.0–36.0)
MCV: 74.5 fL — ABNORMAL LOW (ref 80.0–100.0)
Platelets: 554 10*3/uL — ABNORMAL HIGH (ref 150–400)
RBC: 3.85 MIL/uL — ABNORMAL LOW (ref 4.22–5.81)
RDW: 14.4 % (ref 11.5–15.5)
WBC: 9.6 10*3/uL (ref 4.0–10.5)
nRBC: 0 % (ref 0.0–0.2)

## 2019-11-19 LAB — POTASSIUM: Potassium: 5.1 mmol/L (ref 3.5–5.1)

## 2019-11-19 NOTE — TOC Progression Note (Addendum)
Transition of Care [facility]) - Progression Note    Patient Details  Name: Gary Sutton MRN: [id number] Date of Birth: 06-14-1960  [facility] CM/SW Contact  Liliana Cline, LCSW Phone Number: 11/19/2019, 12:33 PM  Clinical Narrative:     Spoke to patient. He says he has had both COVID vaccines, July 12 and August 12. He says he is still agreeable to SNF rehab at discharge.  No bed offers in Epic.  Called [facility], per Tammy they can accept patient but do not have beds today. Possibly tomorrow, Tammy said to check in the morning on bed availability.  Called [facility], per [place] they can make a bed offer but patient will have to agree to smoke free facility.   Called [facility], per Gavin Pound she will have staff review his information today.   1:55- Spoke to patient. He said he was told by the MD that he could stay another week. Explained that Dr. Nelson Chimes said he is ready for discharge as soon as we find a SNF for him. Patient reported he does not want [facility] because it is too far away. Patient says he would be agreeable to [facility] and is aware they should have a bed available tomorrow.          Expected Discharge Plan and Services                                                 Social Determinants of Health (SDOH) Interventions    Readmission Risk Interventions No flowsheet data found.

## 2019-11-19 NOTE — Progress Notes (Signed)
PROGRESS NOTE    NNAMDI DACUS  GYI:[id number] DOB: Aug 26, 1960 DOA: 11/05/2019 PCP: Patient, No Pcp Per   Brief Narrative:  [name] y.o.malewith a known history of type 2 diabetes mellitus, glaucoma and degenerative disc disease, who presented to the emergency room with acute onset of right lower extremity pain involving his leg with associated right foot swelling with erythema at the big toe. Patient was admitted for sepsis secondary to right big toe diabetic ulcer and osteomyelitis along with MSSA bacteremia. Podiatry was consulted but patient refused amputation. Patient is legally blind.  Now agrees to go to SNF as family is not comfortable providing him with IV antibiotics at home.  Had a bed offer-waiting for insurance authorization. 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 Cleda Mccreedy.  Subjective: Patient was having some pain at surgical site, stating that it is bearable. Discussed that he will need antibiotics till 12/17/19 due to bacteremia.  Patient okay to go to SNF on discharge.  Assessment & Plan:   Active Problems:   Osteomyelitis (HCC)  Sepsis secondary to MSSA bacteremia due to right big toe osteomyelitis and diabetic ulcer s/p amputation.  Initially started on cefepime and vancomycin and then later switched to cefazolin and Flagyl per ID. 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  Flagyl was discontinued now. -Continue cefazolin-per ID it should continue till 12/17/2019. -Continue with pain management. -[facility] withdraw his bed offer due to him being legally blind. -[facility] might be able to offer a bed tomorrow. -Obtained another PT evaluation after amputation. -Bandage was changed by podiatry today and he has to keep it on till he will see  his podiatrist next week as an outpatient.  Hyperkalemia.  Resolved. Patient received calcium gluconate, insulin and Lokelma due to potassium of 6 and peaked T waves. -Continue to monitor.  Hyponatremia.  Corrected sodium of 131.  Appears close to his baseline.  Patient has chronic hyponatremia. 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  MRI lumbar spine, right hip showed the above findings. CK not elevated. --supportive care for now --Flexeril PRN and tramadol PRN.  Poorly controlled diabetes mellitus with hyperglycemia.  Apparently not even taking his home Metformin.  A1c of 12.6.  CBG improved with increasing Levemir. Patient refused to start insulin after discharge. -Continue Levemir 15 units twice daily. -Continue with SSI.  Microcytic anemia with iron and vitamin B12 deficiency. Hemoglobin seems stable. -Continue with iron and B12 supplement.  Glaucoma. -Continue home meds.  Suspect multiple myeloma -increased in urine protein, MRI spine showing possible marrow replacement processes. -SPEP neg for MM.  "pattern appears to be compatible with a chronic inflammatory response."   Objective: Vitals:   11/18/19 1441 11/18/19 2326 11/19/19 0726 11/19/19 1535  BP: 97/67 104/64 103/69 135/79  Pulse: 98 95 94 100  Resp: _0 Temp: 98.8 F (37.1 C) 99 F (37.2 C) 98.5 F (36.9 C) 98.3 F (36.8 C)  TempSrc: Oral Oral Oral Oral  SpO2: 98% 100% 97% 97%  Weight:      Height:        Intake/Output Summary (Last 24  hours) at 11/19/2019 1603 Last data filed at 11/19/2019 1538 Gross per 24 hour  Intake 1609.42 ml  Output 1850 ml  Net -240.58 ml   Filed Weights   11/05/19 1841  Weight: 86.2 kg    Examination:  General.  Well-developed, legally blind gentleman, in no  acute distress. Pulmonary.  Lungs clear bilaterally, normal respiratory effort. CV.  Regular rate and rhythm, no JVD, rub or murmur. Abdomen.  Soft, nontender, nondistended, BS positive. CNS.  Alert and oriented x3.  No focal neurologic deficit. Extremities.  Right lower extremity with clean bandage and Ace wrap. Psychiatry.  Judgment and insight appears normal.  DVT prophylaxis: Lovenox Code Status: Full Family Communication: Discussed with patient. Disposition Plan:  Status is: Inpatient  Remains inpatient appropriate because:Inpatient level of care appropriate due to severity of illness   Dispo: The patient is from: Home              Anticipated d/c is to: SNF              Anticipated d/c date is: 1-2 days, [facility] withdraw his bed offer secondary to his poor vision??.  [facility] might be able to offer a bed tomorrow.  TOC is working on it.  Consultants:   Podiatry  ID  Procedures:  Right hallux amputation with first ray resection on 11/17/2019.  Antimicrobials:  Cefazolin  Data Reviewed: I have personally reviewed following labs and imaging studies  CBC: Recent Labs  Lab 11/13/19 0337 11/14/19 0525 11/15/19 0535 11/18/19 1002 11/19/19 0532  WBC 9.6 10.6* 8.4 8.8 9.6  HGB 9.3* 10.0* 9.9* 10.1* 9.0*  HCT 29.7* 31.4* 31.4* 31.8* 28.7*  MCV 75.2* 74.8* 74.1* 78.1* 74.5*  PLT 588* 648* 629* 726* 315*   Basic Metabolic Panel: Recent Labs  Lab 11/13/19 0337 11/13/19 0337 11/14/19 0525 11/14/19 0525 11/15/19 0535 11/15/19 0535 11/17/19 2040 11/17/19 2040 11/18/19 1002 11/18/19 1438 11/18/19 1757 11/18/19 2330 11/19/19 0532  NA 131*   < > 128*  --  129*  --  131*  --  126*  --   --   --  130*  K 4.4   < > 5.2*   < > 5.1   < > 5.2*   < > 6.0* 5.9* 5.1 5.1 4.8  CL 94*   < > 92*  --  94*  --  95*  --  97*  --   --   --  96*  CO2 31   < > 28  --  28  --  30  --  22  --   --   --  28  GLUCOSE 135*   < > 142*  --  161*  --  107*  --  265*  --    --   --  135*  BUN 16   < > 15  --  17  --  22  --  26*  --   --   --  26*  CREATININE 1.02   < > 1.04  --  1.02  --  1.01  --  1.18  --   --   --  1.05  CALCIUM 8.1*   < > 8.4*  --  8.6*  --  8.8*  --  8.3*  --   --   --  8.6*  MG 2.0  --  1.9  --  2.0  --  2.0  --   --   --   --   --   --    < > =  values in this interval not displayed.   GFR: Estimated Creatinine Clearance: 85.9 mL/min (by C-G formula based on SCr of 1.05 mg/dL). Liver Function Tests: Recent Labs  Lab 11/17/19 2040  AST 19  ALT 7  ALKPHOS 64  BILITOT 0.4  PROT 9.3*  ALBUMIN 2.4*   No results for input(s): LIPASE, AMYLASE in the last 168 hours. No results for input(s): AMMONIA in the last 168 hours. Coagulation Profile: Recent Labs  Lab 11/17/19 2040  INR 1.3*   Cardiac Enzymes: No results for input(s): CKTOTAL, CKMB, CKMBINDEX, TROPONINI in the last 168 hours. BNP (last 3 results) No results for input(s): PROBNP in the last 8760 hours. HbA1C: No results for input(s): HGBA1C in the last 72 hours. CBG: Recent Labs  Lab 11/18/19 1159 11/18/19 1626 11/18/19 2130 11/19/19 0724 11/19/19 1143  GLUCAP 191* 298* 147* 153* 124*   Lipid Profile: No results for input(s): CHOL, HDL, LDLCALC, TRIG, CHOLHDL, LDLDIRECT in the last 72 hours. Thyroid Function Tests: No results for input(s): TSH, T4TOTAL, FREET4, T3FREE, THYROIDAB in the last 72 hours. Anemia Panel: No results for input(s): VITAMINB12, FOLATE, FERRITIN, TIBC, IRON, RETICCTPCT in the last 72 hours. Sepsis Labs: No results for input(s): PROCALCITON, LATICACIDVEN in the last 168 hours.  Recent Results (from the past 240 hour(s))  SARS Coronavirus 2 by RT PCR (hospital order, performed in [facility] hospital lab) Nasopharyngeal Nasopharyngeal Swab     Status: None   Collection Time: 11/17/19  7:53 AM   Specimen: Nasopharyngeal Swab  Result Value Ref Range Status   SARS Coronavirus 2 NEGATIVE NEGATIVE Final    Comment: (NOTE) SARS-CoV-2 target  nucleic acids are NOT DETECTED.  The SARS-CoV-2 RNA is generally detectable in upper and lower respiratory specimens during the acute phase of infection. The lowest concentration of SARS-CoV-2 viral copies this assay can detect is 250 copies / mL. A negative result does not preclude SARS-CoV-2 infection and should not be used as the sole basis for treatment or other patient management decisions.  A negative result may occur with improper specimen collection / handling, submission of specimen other than nasopharyngeal swab, presence of viral mutation(s) within the areas targeted by this assay, and inadequate number of viral copies (<250 copies / mL). A negative result must be combined with clinical observations, patient history, and epidemiological information.  Fact Sheet for Patients:   StrictlyIdeas.no  Fact Sheet for Healthcare Providers: BankingDealers.co.za  This test is not yet approved or  cleared by the [place] FDA and has been authorized for detection and/or diagnosis of SARS-CoV-2 by FDA under an Emergency Use Authorization (EUA).  This EUA will remain in effect (meaning this test can be used) for the duration of the COVID-19 declaration under Section 564(b)(1) of the Act, 21 U.S.C. section 360bbb-3(b)(1), unless the authorization is terminated or revoked sooner.  Performed at [facility], [address] [place], [place] [address]   Anaerobic culture     Status: None (Preliminary result)   Collection Time: 11/18/19 12:03 AM   Specimen: PATH Other  Result Value Ref Range Status   Specimen Description   Final    FOOT RIGHT RIGHT FOOT RAY Performed at [facility], [address] [place], [place] [address]    Special Requests   Final    NONE Performed at [facility], [address] [place], [place] [address]    Gram Stain   Final    RARE WBC PRESENT, PREDOMINANTLY PMN NO  ORGANISMS SEEN Performed  at [facility], [place] [address] [place], [place] [address]    Culture PENDING  Incomplete   Report Status PENDING  Incomplete  Aerobic Culture (superficial specimen)     Status: None (Preliminary result)   Collection Time: 11/18/19 12:03 AM   Specimen: PATH Other  Result Value Ref Range Status   Specimen Description   Final    FOOT RIGHT Performed at [facility], [address] [place], [place] [address]    Special Requests   Final    NONE Performed at [facility], [address] [place], [place] [address]    Gram Stain   Final    RARE WBC PRESENT, PREDOMINANTLY PMN NO ORGANISMS SEEN    Culture   Final    NO GROWTH 1 DAY Performed at [facility], [place] [address], [place], [place] [address]    Report Status PENDING  Incomplete     Radiology Studies: No results found.  Scheduled Meds: . brimonidine  1 drop Both Eyes BID  . Chlorhexidine Gluconate Cloth  6 each Topical Daily  . cholecalciferol  1,000 Units Oral Daily  . cyclobenzaprine  10 mg Oral TID  . enoxaparin (LOVENOX) injection  40 mg Subcutaneous Q24H  . insulin aspart  0-15 Units Subcutaneous TID WC  . insulin detemir  15 Units Subcutaneous BID  . iron polysaccharides  150 mg Oral Daily  . timolol  1 drop Both Eyes BID  . vitamin B-12  1,000 mcg Oral Daily   Continuous Infusions: . sodium chloride 100 mL (11/19/19 1304)  .  ceFAZolin (ANCEF) IV 2 g (11/19/19 1306)  . sodium chloride       LOS: 14 days   Time spent: 25 minutes  Lorella Nimrod, MD [facility]  If 7PM-7AM, please contact night-coverage [email or web address]  11/19/2019, 4:03 PM   This record has been created using Systems analyst. Errors have been sought and corrected,but may not always be located. Such creation errors do not reflect on the standard of care.

## 2019-11-19 NOTE — Progress Notes (Signed)
2 Days Post-Op   Subjective/Chief Complaint: Patient seen.  No complaints.   Objective: Vital signs in last 24 hours: Temp:  [98.5 F (36.9 C)-99 F (37.2 C)] 98.5 F (36.9 C) (10/26 0726) Pulse Rate:  [94-98] 94 (10/26 0726) Resp:  [17-20] 20 (10/26 0726) BP: (97-104)/(64-69) 103/69 (10/26 0726) SpO2:  [97 %-100 %] 97 % (10/26 0726) Last BM Date: 11/16/19  Intake/Output from previous day: 10/25 0701 - 10/26 0700 In: 1489.4 [P.O.:600; I.V.:165.8; IV Piggyback:723.6] Out: 1650 [Urine:1650] Intake/Output this shift: No intake/output data recorded.  Bandage on the right foot is dry and intact.  Upon removal there is no significant bleeding from the incision area.  Skin edges are viable with incision well coapted.      Lab Results:  Recent Labs    11/18/19 1002 11/19/19 0532  WBC 8.8 9.6  HGB 10.1* 9.0*  HCT 31.8* 28.7*  PLT 726* 554*   BMET Recent Labs    11/18/19 1002 11/18/19 1438 11/18/19 2330 11/19/19 0532  NA 126*  --   --  130*  K 6.0*   < > 5.1 4.8  CL 97*  --   --  96*  CO2 22  --   --  28  GLUCOSE 265*  --   --  135*  BUN 26*  --   --  26*  CREATININE 1.18  --   --  1.05  CALCIUM 8.3*  --   --  8.6*   < > = values in this interval not displayed.   PT/INR Recent Labs    11/17/19 2040  LABPROT 16.0*  INR 1.3*   ABG No results for input(s): PHART, HCO3 in the last 72 hours.  Invalid input(s): PCO2, PO2  Studies/Results: No results found.  Anti-infectives: Anti-infectives (From admission, onward)   Start     Dose/Rate Route Frequency Ordered Stop   11/07/19 1715  metroNIDAZOLE (FLAGYL) IVPB 500 mg  Status:  Discontinued        500 mg 100 mL/hr over 60 Minutes Intravenous Every 8 hours 11/07/19 1622 11/18/19 1107   11/06/19 1400  ceFAZolin (ANCEF) IVPB 2g/100 mL premix        2 g 200 mL/hr over 30 Minutes Intravenous Every 8 hours 11/06/19 0925     11/06/19 0900  vancomycin (VANCOCIN) IVPB 1000 mg/200 mL premix  Status:  Discontinued         1,000 mg 200 mL/hr over 60 Minutes Intravenous Every 12 hours 11/06/19 0110 11/06/19 0757   11/06/19 0900  vancomycin (VANCOREADY) IVPB 1500 mg/300 mL  Status:  Discontinued        1,500 mg 150 mL/hr over 120 Minutes Intravenous Every 12 hours 11/06/19 0757 11/06/19 0925   11/06/19 0800  ceFEPIme (MAXIPIME) 2 g in sodium chloride 0.9 % 100 mL IVPB  Status:  Discontinued        2 g 200 mL/hr over 30 Minutes Intravenous Every 8 hours 11/06/19 0107 11/06/19 0925   11/06/19 0230  ceFEPIme (MAXIPIME) 2 g in sodium chloride 0.9 % 100 mL IVPB  Status:  Discontinued        2 g 200 mL/hr over 30 Minutes Intravenous  Once 11/06/19 0228 11/06/19 0232   11/06/19 0230  vancomycin (VANCOCIN) IVPB 1000 mg/200 mL premix  Status:  Discontinued        1,000 mg 200 mL/hr over 60 Minutes Intravenous  Once 11/06/19 0228 11/06/19 0234   11/05/19 2345  ceFEPIme (MAXIPIME) 2 g in sodium chloride  0.9 % 100 mL IVPB        2 g 200 mL/hr over 30 Minutes Intravenous  Once 11/05/19 2324 11/06/19 0222   11/05/19 2330  vancomycin (VANCOCIN) IVPB 1000 mg/200 mL premix  Status:  Discontinued        1,000 mg 200 mL/hr over 60 Minutes Intravenous  Once 11/05/19 2324 11/05/19 2326   11/05/19 2115  vancomycin (VANCOCIN) IVPB 1000 mg/200 mL premix        1,000 mg 200 mL/hr over 60 Minutes Intravenous  Once 11/05/19 2112 11/05/19 2227      Assessment/Plan: s/p Procedure(s): Right first ray resection (Right) Assessment: Stable status post 1st ray resection right foot.   Plan: Betadine and a sterile bandage applied to the right foot.  Patient will keep the bandage dry and intact and leave in place until reevaluated next week outpatient.  Patient should be stable for discharge pending medical stability and infectious disease recommendations.  Plan for follow-up in 1 week.  LOS: 14 days    Ricci Barker 11/19/2019

## 2019-11-19 NOTE — Progress Notes (Signed)
PHARMACY CONSULT NOTE FOR:  OUTPATIENT  PARENTERAL ANTIBIOTIC THERAPY (OPAT)  Indication: MSSA bacteremia and osteomyelitis Regimen: Cefazolin 2gm IV q8h (no need for continuous infusion as no longer going home, now going to SNF) End date: 12/18/2019  IV antibiotic discharge orders are pended. To discharging provider:  please sign these orders via discharge navigator,  Select New Orders & click on the button choice - Manage This Unsigned Work.     Thank you for allowing pharmacy to be a part of this patient's care.  Juliette Alcide, PharmD, BCPS.   Work Cell: [phone] 11/19/2019 4:49 PM

## 2019-11-19 NOTE — Progress Notes (Signed)
Received report from Hoy Register, RN. Assuming care of patient at this time.

## 2019-11-20 DIAGNOSIS — Z794 Long term (current) use of insulin: Secondary | ICD-10-CM

## 2019-11-20 DIAGNOSIS — D509 Iron deficiency anemia, unspecified: Secondary | ICD-10-CM

## 2019-11-20 DIAGNOSIS — H548 Legal blindness, as defined in USA: Secondary | ICD-10-CM

## 2019-11-20 DIAGNOSIS — M8600 Acute hematogenous osteomyelitis, unspecified site: Secondary | ICD-10-CM

## 2019-11-20 LAB — CBC
HCT: 28.9 % — ABNORMAL LOW (ref 39.0–52.0)
Hemoglobin: 9.1 g/dL — ABNORMAL LOW (ref 13.0–17.0)
MCH: 23.4 pg — ABNORMAL LOW (ref 26.0–34.0)
MCHC: 31.5 g/dL (ref 30.0–36.0)
MCV: 74.3 fL — ABNORMAL LOW (ref 80.0–100.0)
Platelets: 567 10*3/uL — ABNORMAL HIGH (ref 150–400)
RBC: 3.89 MIL/uL — ABNORMAL LOW (ref 4.22–5.81)
RDW: 14.3 % (ref 11.5–15.5)
WBC: 8.9 10*3/uL (ref 4.0–10.5)
nRBC: 0 % (ref 0.0–0.2)

## 2019-11-20 LAB — BASIC METABOLIC PANEL
Anion gap: 5 (ref 5–15)
BUN: 24 mg/dL — ABNORMAL HIGH (ref 8–23)
CO2: 30 mmol/L (ref 22–32)
Calcium: 8.6 mg/dL — ABNORMAL LOW (ref 8.9–10.3)
Chloride: 94 mmol/L — ABNORMAL LOW (ref 98–111)
Creatinine, Ser: 0.94 mg/dL (ref 0.61–1.24)
GFR, Estimated: >60 mL/min (ref >60)
Glucose, Bld: 158 mg/dL — ABNORMAL HIGH (ref 70–99)
Potassium: 4.9 mmol/L (ref 3.5–5.1)
Sodium: 129 mmol/L — ABNORMAL LOW (ref 135–145)

## 2019-11-20 LAB — AEROBIC CULTURE W GRAM STAIN (SUPERFICIAL SPECIMEN): Culture: NO GROWTH

## 2019-11-20 LAB — GLUCOSE, CAPILLARY
Glucose-Capillary: 130 mg/dL — ABNORMAL HIGH (ref 70–99)
Glucose-Capillary: 252 mg/dL — ABNORMAL HIGH (ref 70–99)

## 2019-11-20 MED ORDER — INSULIN ASPART 100 UNIT/ML ~~LOC~~ SOLN: SUBCUTANEOUS | Status: AC

## 2019-11-20 MED ORDER — CYCLOBENZAPRINE HCL 10 MG PO TABS: 10.0000 mg | ORAL_TABLET | Freq: Three times a day (TID) | ORAL | 0 refills | Status: AC | PRN

## 2019-11-20 MED ORDER — OXYCODONE-ACETAMINOPHEN 5-325 MG PO TABS: 1.0000 | ORAL_TABLET | ORAL | 0 refills | Status: AC | PRN

## 2019-11-20 MED ORDER — CYANOCOBALAMIN 1000 MCG PO TABS: 1000.0000 ug | ORAL_TABLET | Freq: Every day | ORAL | Status: AC

## 2019-11-20 MED ORDER — SENNOSIDES-DOCUSATE SODIUM 8.6-50 MG PO TABS: 1.0000 | ORAL_TABLET | Freq: Two times a day (BID) | ORAL | 0 refills | Status: AC

## 2019-11-20 MED ORDER — ENOXAPARIN SODIUM 40 MG/0.4ML ~~LOC~~ SOLN: 40.0000 mg | SUBCUTANEOUS | Status: AC

## 2019-11-20 MED ORDER — POLYSACCHARIDE IRON COMPLEX 150 MG PO CAPS: 150.0000 mg | ORAL_CAPSULE | ORAL | Status: AC

## 2019-11-20 MED ORDER — INSULIN DETEMIR 100 UNIT/ML ~~LOC~~ SOLN: 15.0000 [IU] | Freq: Two times a day (BID) | SUBCUTANEOUS | 0 refills | Status: AC

## 2019-11-20 MED ORDER — CEFAZOLIN IV (FOR PTA / DISCHARGE USE ONLY): 2.0000 g | Freq: Three times a day (TID) | INTRAVENOUS | 0 refills | Status: AC

## 2019-11-20 NOTE — Discharge Summary (Signed)
Discharge Summary  Gary Sutton BLT:[id number] DOB: October 09, 1960  PCP: Patient, No Pcp Per  Admit date: 11/05/2019 Discharge date: 11/20/2019  Time spent: 66mins, more than 50% time spent on coordination of care.   Recommendations for Outpatient Follow-up:  1. F/u with SNF MD for hospital discharge follow up, repeat cbc/bmp at follow up 2. F/u with podiatrist Dr Sharlotte Alamo in one week, Betadine and a sterile bandage applied to the right foot.  Patient will keep the bandage dry and intact and leave in place until reevaluated next week outpatient by Dr Cleda Mccreedy 3. Patient is discharged with Lovenox subcu $RemoveBefor'40mg'RajyrBgXbcER$  daily for DVT prophylaxis for 7 days due to decreased mobility, SNF MD to reval ongoing needs 4. Follow-up with infectious disease, remove PICC line when okay with infectious disease  Discharge Diagnoses:  Active Hospital Problems   Diagnosis Date Noted   Osteomyelitis ([place]) 11/05/2019    Resolved Hospital Problems  No resolved problems to display.    Discharge Condition: stable  Diet recommendation: heart healthy/carb modified  Filed Weights   11/05/19 1841  Weight: 86.2 kg    History of present illness: (Per admitting MD Dr. Sidney Ace) Gary Sutton  is a 59 y.o. male with a known history of type 2 diabetes mellitus, glaucoma and degenerative disc disease, who presented to the emergency room with acute onset of right lower extremity pain involving his leg with associated right foot swelling with erythema at the big toe.  The patient stated that he had pain around his right hip flexors and has not had any trauma.  He admitted to chills but did not check his temperature.  His right big toe has been swelling over the last week.  No chest pain or dyspnea or cough or wheezing.  No dysuria, oliguria or hematuria or flank pain.  Upon presentation to the emergency room, temperature was 101.1 with a blood pressure 168/79 with heart rate of 117.  Labs revealed blood glucose of 368 and total  protein of 9.1 with albumin 2.7.  CBC showed leukocytosis 13.3 with leukocytosis and anemia as well as thrombocytosis.  Influenza antigens and COVID-19 PCR came back negative.  UA showed more than 300 protein and more than 500 glucose with 20 of ketones and hyaline casts.  Blood cultures were drawn.  Two-view right foot x-ray showed erosive changes at the first MTP joint with soft tissue swelling consistent with osteomyelitis.  The patient was given 50 mcg of IV fentanyl, 1 L bolus of IV lactated Ringer and 1 g of IV vancomycin.  He will be admitted to a medical bed for further evaluation and management.  Hospital Course:  Active Problems:   Osteomyelitis (HCC)  Sepsis secondary to MSSA bacteremia due to right big toe osteomyelitis and diabetic ulcer s/p amputation.  Initially started on cefepime and vancomycin and then later switched to cefazolin and Flagyl per ID. 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  Flagyl was discontinued now. -Continue cefazolin 2gm iv q8hr -per ID it should continue till 12/17/2019. Remove PIcc line when ok with ID -keep -Bandage on till he sees podiatrist  Dr Cleda Mccreedy next week as an outpatient.  Hyperkalemia.  Resolved. Patient received calcium gluconate, insulin and Lokelma due to potassium of 6 and peaked T waves. -Continue to monitor.  Hyponatremia.  Appears close to his baseline.  Patient has chronic hyponatremia.  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 No epidural abscess or discitis-osteomyelitis. 2. 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 3. Mild L4-L5 left lateral recess stenosis secondary to small central disc protrusion, a potential source of L5 radiculopathy. 4. Diffusely heterogeneous bone marrow signal  is nonspecific and may be seen in chronic anemia, obesity, long-term smoking or in marrow replacement processes such as multiple myeloma  Microcytic anemia with iron and vitamin B12 deficiency. Hemoglobin seems stable. -Continue with iron and B12 supplement. -spep negative for m-spike  Poorly controlled diabetes mellitus with hyperglycemia.   A1c of 12.6 Apparently not even taking his home Metformin. Not on insulin prior to hospitalization -Continue Levemir 15 units twice daily. -Continue with SSI.  Legally blind  Procedures:  picc line placement  Right first ray resection on Oct 24 by Dr. Sharlotte Alamo  Consultations:  Podiatry  Infectious disease  Discharge Exam: BP 107/68 (BP Location: Left Arm)    Pulse 97    Temp 98.1 F (36.7 C) (Oral)    Resp 16    Ht $R'6\' 2"'La$  (1.88 m)    Wt 86.2 kg    SpO2 98%    BMI 24.39 kg/m   General:  Blind in left eye, partial vision right eye Cardiovascular: RRR Respiratory: Clear to auscultation bilaterally Extremity: Rt hip /upper thigh pain with decreased mobility, right foot dressing intact in Prevalon boot  Discharge Instructions You were cared for by a hospitalist during your hospital stay. If you have any questions about your discharge medications or the care you received while you were in the hospital after you are discharged, you can call the unit and asked to speak with the hospitalist on call if the hospitalist that took care of you is not available. Once you are discharged, your primary care physician will handle any further medical issues. Please note that NO REFILLS for any discharge medications will be authorized once you are discharged, as it is imperative that you return to your primary care physician (or establish a relationship with a primary care physician if you do not have one) for your aftercare needs so that they can reassess your need for medications and monitor your lab values.  Discharge Instructions    Change dressing on  IV access line weekly and PRN   Complete by: As directed    Diet - low sodium heart healthy   Complete by: As directed    Carb modified diet   Discharge wound care:   Complete by: As directed    Keep dressing in place until evaluated by podiatrist Dr. Cleda Mccreedy in one week   Increase activity slowly   Complete by: As directed      Allergies as of 11/20/2019   No Known Allergies     Medication List    STOP taking these medications   timolol 0.25 % ophthalmic solution Commonly known as: TIMOPTIC     TAKE these medications   brimonidine 0.2 % ophthalmic solution Commonly known as: ALPHAGAN Place 1 drop into both eyes 2 (two) times daily.   ceFAZolin  IVPB Commonly known as: ANCEF Inject 2 g into the vein every 8 (eight) hours. Indication:  Bacteremia and osteo Last Day of Therapy:  12/18/2019 Labs - Once weekly:  CBC/D and BMP, Labs - Every other week:  ESR and CRP   cholecalciferol 25 MCG (1000 UNIT) tablet Commonly known as: VITAMIN D3 Take 1,000 Units by  mouth daily.   cyanocobalamin 1000 MCG tablet Take 1 tablet (1,000 mcg total) by mouth daily. Start taking on: November 21, 2019   cyclobenzaprine 10 MG tablet Commonly known as: FLEXERIL Take 1 tablet (10 mg total) by mouth 3 (three) times daily as needed for muscle spasms.   enoxaparin 40 MG/0.4ML injection Commonly known as: LOVENOX Inject 0.4 mLs (40 mg total) into the skin daily for 7 days. Start taking on: November 21, 2019   insulin aspart 100 UNIT/ML injection Commonly known as: novoLOG Insulin sliding scale: Blood sugar  120-150   3units                       151-200   4units                       201-250   7units                       251- 300  11units                       301-350   15uints                       351-400   20units                       >400         call MD immediately   insulin detemir 100 UNIT/ML injection Commonly known as: LEVEMIR Inject 0.15 mLs (15 Units total) into the skin 2  (two) times daily.   iron polysaccharides 150 MG capsule Commonly known as: NIFEREX Take 1 capsule (150 mg total) by mouth every Monday, Wednesday, and Friday.   metFORMIN 500 MG tablet Commonly known as: Glucophage Take 1 tablet (500 mg total) by mouth 2 (two) times daily with a meal.   MSM 1000 MG Caps Take 1 capsule by mouth daily.   oxyCODONE-acetaminophen 5-325 MG tablet Commonly known as: PERCOCET/ROXICET Take 1 tablet by mouth every 4 (four) hours as needed for severe pain.   senna-docusate 8.6-50 MG tablet Commonly known as: Senokot-S Take 1 tablet by mouth 2 (two) times daily.   timolol 0.25 % ophthalmic solution Commonly known as: BETIMOL Place 1 drop into both eyes 2 (two) times daily.   Travoprost (BAK Free) 0.004 % Soln ophthalmic solution Commonly known as: TRAVATAN Place 1 drop into both eyes at bedtime.   TURMERIC PO Take 1 tablet by mouth daily.   zinc gluconate 50 MG tablet Take 50 mg by mouth daily.            Discharge Care Instructions  (From admission, onward)         Start     Ordered   11/20/19 0000  Change dressing on IV access line weekly and PRN  (Home infusion instructions - [facility] )        11/20/19 1217   11/20/19 0000  Discharge wound care:       Comments: Keep dressing in place until evaluated by podiatrist Dr. Cleda Mccreedy in one week   11/20/19 1217         No Known Allergies  Contact information for follow-up providers    Tsosie Billing, MD Follow up.   Specialty: Infectious Diseases Why: remove picc line when ok by ID Contact information: [place]  [place] [address] [phone]        Sharlotte Alamo, DPM Follow up in 1 week(s).   Specialty: Podiatry Contact information: [place] [address] [phone]            Contact information for after-discharge care    Destination    [facility] Preferred [facility] .   Service: Skilled  Nursing Contact information: [address] [place] [phone]                   The results of significant diagnostics from this hospitalization (including imaging, microbiology, ancillary and laboratory) are listed below for reference.    Significant Diagnostic Studies: MR Lumbar Spine W Wo Contrast  Result Date: 11/06/2019 CLINICAL DATA:  Back pain and bacteremia EXAM: MRI LUMBAR SPINE WITHOUT AND WITH CONTRAST TECHNIQUE: Multiplanar and multiecho pulse sequences of the lumbar spine were obtained without and with intravenous contrast. CONTRAST:  63mL GADAVIST GADOBUTROL 1 MMOL/ML IV SOLN COMPARISON:  None. FINDINGS: Segmentation:  Standard Alignment:  Physiologic. Vertebrae: Nonspecific heterogeneous bone marrow signal. No abnormal contrast enhancement. Conus medullaris and cauda equina: Conus extends to the L1 level. Conus and cauda equina appear normal. Paraspinal and other soft tissues: Negative Disc levels: T12-L1: Normal. L1-L2: Ankylosis. Normal disc. No spinal canal stenosis. No neural foraminal stenosis. L2-L3: Ankylosis. Normal disc. No spinal canal stenosis. No neural foraminal stenosis. L3-L4: Ankylosis. Normal disc. No spinal canal stenosis. No neural foraminal stenosis. L4-L5: Ankylosis. Small central disc protrusion. Left lateral recess narrowing without central spinal canal stenosis. No neural foraminal stenosis. L5-S1: Ankylosis. No disc herniation. No spinal canal stenosis. No neural foraminal stenosis. Visualized sacrum: Ankylosis of both sacroiliac joints. IMPRESSION: 1. No epidural abscess or discitis-osteomyelitis. 2. 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 3. Mild L4-L5 left lateral recess stenosis secondary to small central disc protrusion, a potential source of L5 radiculopathy. 4. Diffusely heterogeneous bone marrow signal is nonspecific and may be seen in chronic anemia, obesity,  long-term smoking or in marrow replacement processes such as multiple myeloma Electronically Signed   By: Ulyses Jarred M.D.   On: 11/06/2019 22:10   MR HIP RIGHT W WO CONTRAST  Result Date: 11/07/2019 CLINICAL DATA:  Right hip pain.  Bacteremia. EXAM: MRI OF THE RIGHT HIP WITHOUT AND WITH CONTRAST TECHNIQUE: Multiplanar, multisequence MR imaging was performed both before and after administration of intravenous contrast. CONTRAST:  71mL GADAVIST GADOBUTROL 1 MMOL/ML IV SOLN COMPARISON:  Right femur x-rays from same day. FINDINGS: Bones: There is no evidence of acute fracture, dislocation or avascular necrosis. Multiple hemangiomas involving the sacrum and bilateral iliac bones. Ankylosis of the bilateral sacroiliac joints. Normal pubic symphysis. Articular cartilage and labrum Articular cartilage: Scattered partial and full-thickness cartilage loss in both hip joints with subchondral marrow edema and cystic change in the acetabula and bulky marginal acetabular osteophytes. Labrum: Degenerated right anterior superior labrum. Joint or bursal effusion Joint effusion: Trace right hip joint effusion with 7 mm intra-articular body (series 32, image 18). No synovial enhancement. Bursae: Small amount of fluid in the right greater than left greater trochanteric bursae. Muscles and tendons Muscles and tendons: Prominent edema in the right gluteus minimus muscle with enhancement. Lesser edema in the proximal vastus muscles without significant enhancement. No significant muscle atrophy. The visualized gluteus, hamstring and iliopsoas tendons are intact. Other findings Miscellaneous: Subcutaneous edema overlying the right hip, without enhancement. Multiple enlarged right inguinal and external iliac  lymph nodes, likely reactive. The visualized internal pelvic contents appear unremarkable. Trace free fluid in the pelvis is nonspecific. IMPRESSION: 1. No evidence of osteomyelitis. No findings suggestive of septic arthritis. 2.  Prominent edema in the right gluteus minimus muscle, concerning for myositis in the absence of trauma. 3. Moderate bilateral hip osteoarthritis. Trace right hip joint effusion with 7 mm intra-articular body. 4. Mild right greater than left greater trochanteric bursitis. Electronically Signed   By: Titus Dubin M.D.   On: 11/07/2019 08:32   MR FOOT RIGHT WO CONTRAST  Result Date: 11/07/2019 CLINICAL DATA:  Right great toe and foot swelling with redness. EXAM: MRI OF THE RIGHT FOREFOOT WITHOUT CONTRAST TECHNIQUE: Multiplanar, multisequence MR imaging of the right forefoot was performed. No intravenous contrast was administered. COMPARISON:  Right foot x-rays from yesterday. FINDINGS: Bones/Joint/Cartilage Extensive marrow edema with corresponding decreased T1 marrow signal involving the entire first proximal phalanx and majority of the first metatarsal, sparing the base, as well as the medial and lateral hallux sesamoids. Bony destruction of the first metatarsal head and base of the first proximal phalanx. Small foci of low T1 and T2 signal within first proximal phalanx and first metatarsal head, concerning for gas. Large first MTP joint effusion. No fracture or dislocation. Ligaments The medial collateral ligament at the first MTP joint is not well identified and likely destroyed. Remaining collateral ligaments are intact. Muscles and Tendons Flexor and extensor tendons are intact. No tenosynovitis. Increased T2 signal within in diffuse fatty atrophy of the intrinsic muscles of the forefoot, nonspecific, but likely related to diabetic muscle changes. Soft tissue Soft tissue ulcer along the medial first MTP joint with sinus tract extending to the first metatarsal head. No fluid collection or hematoma. No soft tissue mass. IMPRESSION: 1. Soft tissue ulcer along the medial first MTP joint with sinus tract extending to the first metatarsal head. Underlying first MTP joint septic arthritis with osteomyelitis of  the first proximal phalanx, first metatarsal, and medial and lateral hallux sesamoids. 2. No abscess. Electronically Signed   By: Titus Dubin M.D.   On: 11/07/2019 08:40   DG Foot 2 Views Right  Result Date: 11/05/2019 CLINICAL DATA:  Right foot pain and open wound, initial encounter EXAM: RIGHT FOOT - 2 VIEW COMPARISON:  None. FINDINGS: Significant degenerative changes are noted the first MTP joint. Some dystrophic calcification is noted as well as erosive changes in the distal aspect of the first metatarsal. Associated soft tissue swelling is seen. These changes are consistent with osteomyelitis. Tarsal and calcaneal degenerative changes are seen. Lucency is also noted in the proximal aspect of the fourth metatarsal. This may be projectional in nature although the possibility of undisplaced fracture deserves consideration. IMPRESSION: Erosive changes at the first MTP joint with soft tissue swelling consistent with osteomyelitis. Electronically Signed   By: Inez Catalina M.D.   On: 11/05/2019 19:19   ECHOCARDIOGRAM COMPLETE  Result Date: 11/08/2019    ECHOCARDIOGRAM REPORT   Patient Name:   Gary Sutton Eakins Date of Exam: 11/08/2019 Medical Rec #:  [id number]   Height:       74.0 in Accession #:    [id number]  Weight:       190.0 lb Date of Birth:  1961/01/23   BSA:          2.127 m Patient Age:    59 years    BP:           141/75 mmHg Patient Gender: M  HR:           101 bpm. Exam Location:  [facility] Procedure: 2D Echo, Color Doppler and Cardiac Doppler Indications:     R78.81 Bacteremia  History:         Patient has no prior history of Echocardiogram examinations.                  Risk Factors:Diabetes.  Sonographer:     Charmayne Sheer RDCS (AE) Referring Phys:  [id number] Tsosie Billing Diagnosing Phys: Bartholome Bill MD  Sonographer Comments: No subcostal window. IMPRESSIONS  1. Left ventricular ejection fraction, by estimation, is 50 to 55%. The left ventricle has low normal function. The left  ventricle has no regional wall motion abnormalities. Left ventricular diastolic parameters are consistent with Grade I diastolic dysfunction (impaired relaxation).  2. Right ventricular systolic function is normal. The right ventricular size is normal.  3. The mitral valve is grossly normal. Trivial mitral valve regurgitation.  4. The aortic valve is grossly normal. Aortic valve regurgitation is not visualized. FINDINGS  Left Ventricle: Left ventricular ejection fraction, by estimation, is 50 to 55%. The left ventricle has low normal function. The left ventricle has no regional wall motion abnormalities. The left ventricular internal cavity size was normal in size. There is borderline left ventricular hypertrophy. Left ventricular diastolic parameters are consistent with Grade I diastolic dysfunction (impaired relaxation). Right Ventricle: The right ventricular size is normal. No increase in right ventricular wall thickness. Right ventricular systolic function is normal. Left Atrium: Left atrial size was normal in size. Right Atrium: Right atrial size was normal in size. Pericardium: There is no evidence of pericardial effusion. Mitral Valve: The mitral valve is grossly normal. Trivial mitral valve regurgitation. MV peak gradient, 3.8 mmHg. The mean mitral valve gradient is 2.0 mmHg. There is no evidence of mitral valve vegetation. Tricuspid Valve: The tricuspid valve is grossly normal. Tricuspid valve regurgitation is trivial. There is no evidence of tricuspid valve vegetation. Aortic Valve: The aortic valve is grossly normal. Aortic valve regurgitation is not visualized. Aortic valve mean gradient measures 2.0 mmHg. Aortic valve peak gradient measures 3.6 mmHg. Aortic valve area, by VTI measures 3.81 cm. Pulmonic Valve: The pulmonic valve was not well visualized. Pulmonic valve regurgitation is trivial. Aorta: The aortic root is normal in size and structure. IAS/Shunts: The interatrial septum was not assessed.   LEFT VENTRICLE PLAX 2D LVIDd:         4.72 cm  Diastology LVIDs:         3.47 cm  LV e' medial:    5.11 cm/s LV PW:         1.33 cm  LV E/e' medial:  12.7 LV IVS:        0.92 cm  LV e' lateral:   8.59 cm/s LVOT diam:     2.50 cm  LV E/e' lateral: 7.6 LV SV:         63 LV SV Index:   30 LVOT Area:     4.91 cm  RIGHT VENTRICLE RV Basal diam:  2.98 cm LEFT ATRIUM             Index       RIGHT ATRIUM           Index LA diam:        2.90 cm 1.36 cm/m  RA Area:     14.90 cm LA Vol (A2C):   44.9 ml 21.11 ml/m RA Volume:   36.30 ml  17.07 ml/m LA Vol (A4C):   32.4 ml 15.24 ml/m LA Biplane Vol: 38.9 ml 18.29 ml/m  AORTIC VALVE                   PULMONIC VALVE AV Area (Vmax):    3.99 cm    PV Vmax:       0.75 m/s AV Area (Vmean):   4.05 cm    PV Vmean:      55.400 cm/s AV Area (VTI):     3.81 cm    PV VTI:        0.138 m AV Vmax:           95.50 cm/s  PV Peak grad:  2.2 mmHg AV Vmean:          56.400 cm/s PV Mean grad:  1.0 mmHg AV VTI:            0.165 m AV Peak Grad:      3.6 mmHg AV Mean Grad:      2.0 mmHg LVOT Vmax:         77.60 cm/s LVOT Vmean:        46.500 cm/s LVOT VTI:          0.128 m LVOT/AV VTI ratio: 0.78  AORTA Ao Root diam: 3.90 cm MITRAL VALVE MV Area (PHT): 13.55 cm   SHUNTS MV Peak grad:  3.8 mmHg    Systemic VTI:  0.13 m MV Mean grad:  2.0 mmHg    Systemic Diam: 2.50 cm MV Vmax:       0.97 m/s MV Vmean:      62.7 cm/s MV Decel Time: 56 msec MV E velocity: 65.00 cm/s MV A velocity: 76.60 cm/s MV E/A ratio:  0.85 Bartholome Bill MD Electronically signed by Bartholome Bill MD Signature Date/Time: 11/08/2019/12:45:33 PM    Final    DG FEMUR PORT, MIN 2 VIEWS RIGHT  Result Date: 11/06/2019 CLINICAL DATA:  Bacteremia. Right foot osteomyelitis. Right hip and right lower extremity pain. No reported injury. EXAM: RIGHT FEMUR PORTABLE 2 VIEW COMPARISON:  None. FINDINGS: No fracture. No osseous erosions. Moderate right hip osteoarthritis. No suspicious focal osseous lesions. No dislocation at the right hip  or right knee on these views. No radiopaque foreign bodies. IMPRESSION: Moderate right hip osteoarthritis. No acute osseous abnormality in the right femur. Electronically Signed   By: Ilona Sorrel M.D.   On: 11/06/2019 16:14   [place] EKG SITE RITE  Result Date: 11/11/2019 If Site Rite image not attached, placement could not be confirmed due to current cardiac rhythm.   Microbiology: Recent Results (from the past 240 hour(s))  SARS Coronavirus 2 by RT PCR (hospital order, performed in [facility] hospital lab) Nasopharyngeal Nasopharyngeal Swab     Status: None   Collection Time: 11/17/19  7:53 AM   Specimen: Nasopharyngeal Swab  Result Value Ref Range Status   SARS Coronavirus 2 NEGATIVE NEGATIVE Final    Comment: (NOTE) SARS-CoV-2 target nucleic acids are NOT DETECTED.  The SARS-CoV-2 RNA is generally detectable in upper and lower respiratory specimens during the acute phase of infection. The lowest concentration of SARS-CoV-2 viral copies this assay can detect is 250 copies / mL. A negative result does not preclude SARS-CoV-2 infection and should not be used as the sole basis for treatment or other patient management decisions.  A negative result may occur with improper specimen collection / handling, submission of specimen other than nasopharyngeal swab, presence of viral mutation(s) within  the areas targeted by this assay, and inadequate number of viral copies (<250 copies / mL). A negative result must be combined with clinical observations, patient history, and epidemiological information.  Fact Sheet for Patients:   StrictlyIdeas.no  Fact Sheet for Healthcare Providers: BankingDealers.co.za  This test is not yet approved or  cleared by the [place] FDA and has been authorized for detection and/or diagnosis of SARS-CoV-2 by FDA under an Emergency Use Authorization (EUA).  This EUA will remain in effect (meaning this test can  be used) for the duration of the COVID-19 declaration under Section 564(b)(1) of the Act, 21 U.S.C. section 360bbb-3(b)(1), unless the authorization is terminated or revoked sooner.  Performed at [facility], [address] [place], [place] [address]   Anaerobic culture     Status: None (Preliminary result)   Collection Time: 11/18/19 12:03 AM   Specimen: PATH Other  Result Value Ref Range Status   Specimen Description   Final    FOOT RIGHT RIGHT FOOT RAY Performed at [facility], [address] [place], [place] [address]    Special Requests   Final    NONE Performed at [facility], [address] [place], [place] [address]    Gram Stain   Final    RARE WBC PRESENT, PREDOMINANTLY PMN NO ORGANISMS SEEN Performed at [facility], [place] [address], [place], [place] [address]    Culture   Final    NO ANAEROBES ISOLATED; CULTURE IN PROGRESS FOR 5 DAYS   Report Status PENDING  Incomplete  Aerobic Culture (superficial specimen)     Status: None   Collection Time: 11/18/19 12:03 AM   Specimen: PATH Other  Result Value Ref Range Status   Specimen Description   Final    FOOT RIGHT Performed at [facility], [address], [place], [place] [address]    Special Requests   Final    NONE Performed at [facility], [address] [place], [place] [address]    Gram Stain   Final    RARE WBC PRESENT, PREDOMINANTLY PMN NO ORGANISMS SEEN    Culture   Final    NO GROWTH 2 DAYS Performed at [facility], [place] [address] [place],  [address]    Report Status 11/20/2019 FINAL  Final     Labs: Basic Metabolic Panel: Recent Labs  Lab 11/14/19 0525 11/14/19 0525 11/15/19 0535 11/15/19 0535 11/17/19 2040 11/17/19 2040 11/18/19 1002 11/18/19 1002 11/18/19 1438 11/18/19 1757 11/18/19 2330 11/19/19 0532 11/20/19 0450  NA 128*   < > 129*  --  131*  --  126*  --   --   --   --  130* 129*  K 5.2*   < > 5.1    < > 5.2*   < > 6.0*   < > 5.9* 5.1 5.1 4.8 4.9  CL 92*   < > 94*  --  95*  --  97*  --   --   --   --  96* 94*  CO2 28   < > 28  --  30  --  22  --   --   --   --  28 30  GLUCOSE 142*   < > 161*  --  107*  --  265*  --   --   --   --  135* 158*  BUN 15   < > 17  --  22  --  26*  --   --   --   --  26* 24*  CREATININE 1.04   < > 1.02  --  1.01  --  1.18  --   --   --   --  1.05 0.94  CALCIUM 8.4*   < > 8.6*  --  8.8*  --  8.3*  --   --   --   --  8.6* 8.6*  MG 1.9  --  2.0  --  2.0  --   --   --   --   --   --   --   --    < > = values in this interval not displayed.   Liver Function Tests: Recent Labs  Lab 11/17/19 2040  AST 19  ALT 7  ALKPHOS 64  BILITOT 0.4  PROT 9.3*  ALBUMIN 2.4*   No results for input(s): LIPASE, AMYLASE in the last 168 hours. No results for input(s): AMMONIA in the last 168 hours. CBC: Recent Labs  Lab 11/14/19 0525 11/15/19 0535 11/18/19 1002 11/19/19 0532 11/20/19 0450  WBC 10.6* 8.4 8.8 9.6 8.9  HGB 10.0* 9.9* 10.1* 9.0* 9.1*  HCT 31.4* 31.4* 31.8* 28.7* 28.9*  MCV 74.8* 74.1* 78.1* 74.5* 74.3*  PLT 648* 629* 726* 554* 567*   Cardiac Enzymes: No results for input(s): CKTOTAL, CKMB, CKMBINDEX, TROPONINI in the last 168 hours. BNP: BNP (last 3 results) No results for input(s): BNP in the last 8760 hours.  ProBNP (last 3 results) No results for input(s): PROBNP in the last 8760 hours.  CBG: Recent Labs  Lab 11/19/19 1143 11/19/19 1636 11/19/19 2106 11/20/19 0720 11/20/19 1130  GLUCAP 124* 132* 125* 130* 252*       Signed:  Florencia Reasons MD, PhD, FACP  [facility] 11/20/2019, 12:44 PM

## 2019-11-20 NOTE — TOC Progression Note (Signed)
Transition of Care [facility]) - Progression Note    Patient Details  Name: Gary Sutton MRN: [id number] Date of Birth: [birth year]  Transition of Care [facility]) CM/SW Contact  Liliana Cline, LCSW Phone Number: 11/20/2019, 9:08 AM  Clinical Narrative:   This CSW reached out to [place] and Thayer Ohm in Admissions at [facility] to confirm if they can still accept patient today for rehab. Waiting to hear back.         Expected Discharge Plan and Services                                                 Social Determinants of Health (SDOH) Interventions    Readmission Risk Interventions No flowsheet data found.

## 2019-11-20 NOTE — Care Management Important Message (Signed)
Important Message  Patient Details  Name: Gary Sutton MRN: [id number] Date of Birth: 06-29-1960   Medicare Important Message Given:  Yes     Olegario Messier A Colleena Kurtenbach 11/20/2019, 12:58 PM

## 2019-11-20 NOTE — TOC Transition Note (Addendum)
Transition of Care [facility]) - CM/SW Discharge Note   Patient Details  Name: Gary Sutton MRN: [id number] Date of Birth: [birth year]  Transition of Care [facility]) CM/SW Contact:  Liliana Cline, LCSW Phone Number: 11/20/2019, 11:16 AM   Clinical Narrative:   Patient to discharge to [facility] today, Room 504 A. CSW informed patient and patient's son Gary Sutton). They are both agreeable and aware that this is a semi private room. Medical Necessity Form and Face Sheet in Discharge Packet. Will call for EMS transport when RN is ready.  [facility] transport arranged for 2:00 pm.    Final next level of care: Skilled Nursing Facility Barriers to Discharge: Barriers Resolved   Patient Goals and CMS Choice Patient states their goals for this hospitalization and ongoing recovery are:: SNF rehab CMS Medicare.gov Compare Post Acute Care list provided to:: Patient Choice offered to / list presented to : Patient  Discharge Placement              Patient chooses bed at: [facility] Patient to be transferred to facility by: EMS Name of family member notified: Patient and son Gary Sutton notified by CSW Patient and family notified of of transfer: 11/20/19  Discharge Plan and Services                                     Social Determinants of Health (SDOH) Interventions     Readmission Risk Interventions No flowsheet data found.

## 2019-11-23 LAB — ANAEROBIC CULTURE

## 2019-12-10 ENCOUNTER — Ambulatory Visit: Payer: Medicare Other | Attending: Infectious Diseases | Admitting: Infectious Diseases

## 2019-12-10 ENCOUNTER — Other Ambulatory Visit: Payer: Self-pay

## 2019-12-10 ENCOUNTER — Encounter: Payer: Self-pay | Admitting: Infectious Diseases

## 2019-12-10 ENCOUNTER — Ambulatory Visit: Payer: Medicare Other | Admitting: Infectious Diseases

## 2019-12-10 VITALS — BP 113/79 | HR 110 | Resp 16 | Ht 74.0 in | Wt 164.0 lb

## 2019-12-10 DIAGNOSIS — H409 Unspecified glaucoma: Secondary | ICD-10-CM | POA: Insufficient documentation

## 2019-12-10 DIAGNOSIS — L089 Local infection of the skin and subcutaneous tissue, unspecified: Secondary | ICD-10-CM

## 2019-12-10 DIAGNOSIS — Z794 Long term (current) use of insulin: Secondary | ICD-10-CM | POA: Diagnosis not present

## 2019-12-10 DIAGNOSIS — Z79899 Other long term (current) drug therapy: Secondary | ICD-10-CM | POA: Diagnosis not present

## 2019-12-10 DIAGNOSIS — R7881 Bacteremia: Secondary | ICD-10-CM

## 2019-12-10 DIAGNOSIS — Z89411 Acquired absence of right great toe: Secondary | ICD-10-CM | POA: Insufficient documentation

## 2019-12-10 DIAGNOSIS — E1169 Type 2 diabetes mellitus with other specified complication: Secondary | ICD-10-CM | POA: Diagnosis not present

## 2019-12-10 DIAGNOSIS — B9561 Methicillin susceptible Staphylococcus aureus infection as the cause of diseases classified elsewhere: Secondary | ICD-10-CM | POA: Diagnosis not present

## 2019-12-10 DIAGNOSIS — H5462 Unqualified visual loss, left eye, normal vision right eye: Secondary | ICD-10-CM | POA: Diagnosis not present

## 2019-12-10 DIAGNOSIS — Z7984 Long term (current) use of oral hypoglycemic drugs: Secondary | ICD-10-CM | POA: Insufficient documentation

## 2019-12-10 DIAGNOSIS — Z792 Long term (current) use of antibiotics: Secondary | ICD-10-CM | POA: Insufficient documentation

## 2019-12-10 DIAGNOSIS — Z09 Encounter for follow-up examination after completed treatment for conditions other than malignant neoplasm: Secondary | ICD-10-CM | POA: Diagnosis not present

## 2019-12-10 NOTE — Progress Notes (Signed)
NAME: Gary Sutton  DOB: 10-09-1960  MRN: [id number]  Date/Time: 12/10/2019 10:09 AM   Subjective:  Follow-up visit after recent hospitalization. Gary Sutton is a 59 y.o. 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  He also had MSSA bacteremia.  After much hesitation and refusing amputation for 2 weeks he finally  underwent amputation of the great toe on 11/17/2019.  He was discharged on IV cefazolin to complete another 4 weeks of antibiotics on 12/17/2019.  He is here for follow-up.  He is doing very well.  He does not have any side effects from antibiotics.  He is a week and a giving him his cefazolin 3 times a day.  Does not have any diarrhea.  He has no skin rash or abdominal pain. Past Medical History:  Diagnosis Date  . Diabetes mellitus without complication ([place])   . Glaucoma   . H/O degenerative disc disease     Past Surgical History:  Procedure Laterality Date  . AMPUTATION Right 11/17/2019   Procedure: Right first ray resection;  Surgeon: Sharlotte Alamo, DPM;  Location: [facility] ORS;  Service: Podiatry;  Laterality: Right;  . HERNIA REPAIR      Social History   Socioeconomic History  . Marital status: Married    Spouse name: Not on file  . Number of children: Not on file  . Years of education: Not on file  . Highest education level: Not on file  Occupational History  . Not on file  Tobacco Use  . Smoking status: Never Smoker  . Smokeless tobacco: Never Used  Vaping Use  . Vaping Use: Never used  Substance and Sexual Activity  . Alcohol use: Yes  . Drug use: Never  . Sexual activity: Not on file  Other Topics Concern  . Not on file  Social History Narrative  . Not on file   Social Determinants of Health   Financial Resource Strain:   . Difficulty of Paying Living Expenses: Not on file  Food Insecurity:   . Worried About Sales executive in the Last Year: Not on file  . Ran Out of Food in the Last Year: Not on file  Transportation Needs:   . Lack of Transportation (Medical): Not on file  . Lack of Transportation (Non-Medical): Not on file  Physical Activity:   . Days of Exercise per Week: Not on file  . Minutes of Exercise per Session: Not on file  Stress:   . Feeling of Stress : Not on file  Social Connections:   . Frequency of Communication with Friends and Family: Not on file  . Frequency of Social Gatherings with Friends and Family: Not on file  . Attends Religious Services: Not on file  . Active Member of Clubs or Organizations: Not on file  . Attends Archivist Meetings: Not on file  . Marital Status: Not on file  Intimate Partner Violence:   . Fear of Current or Ex-Partner: Not on file  . Emotionally Abused: Not on file  . Physically Abused: Not on file  . Sexually Abused: Not on file    No family history on file. No Known Allergies  ? Current Outpatient Medications  Medication Sig Dispense Refill  . brimonidine (ALPHAGAN) 0.2 % ophthalmic solution Place 1 drop into both eyes 2 (two) times daily.    Marland Kitchen ceFAZolin (  ANCEF) IVPB Inject 2 g into the vein every 8 (eight) hours. Indication:  Bacteremia and osteo Last Day of Therapy:  12/18/2019 Labs - Once weekly:  CBC/D and BMP, Labs - Every other week:  ESR and CRP 90 Units 0  . cholecalciferol (VITAMIN D3) 25 MCG (1000 UT) tablet Take 1,000 Units by mouth daily.    . cyclobenzaprine (FLEXERIL) 10 MG tablet Take 1 tablet (10 mg total) by mouth 3 (three) times daily as needed for muscle spasms. 30 tablet 0  . insulin aspart (NOVOLOG) 100 UNIT/ML injection Insulin sliding scale: Blood sugar  120-150   3units                       151-200   4units                       201-250   7units                       251- 300  11units                       301-350   15uints                       351-400   20units                       >400         call  MD immediately 10 mL   . insulin detemir (LEVEMIR) 100 UNIT/ML injection Inject 0.15 mLs (15 Units total) into the skin 2 (two) times daily. 10 mL 0  . iron polysaccharides (NIFEREX) 150 MG capsule Take 1 capsule (150 mg total) by mouth every Monday, Wednesday, and Friday.    . Methylsulfonylmethane (MSM) 1000 MG CAPS Take 1 capsule by mouth daily.    Marland Kitchen oxyCODONE-acetaminophen (PERCOCET/ROXICET) 5-325 MG tablet Take 1 tablet by mouth every 4 (four) hours as needed for severe pain. 5 tablet 0  . senna-docusate (SENOKOT-S) 8.6-50 MG tablet Take 1 tablet by mouth 2 (two) times daily. 10 tablet 0  . timolol (BETIMOL) 0.25 % ophthalmic solution Place 1 drop into both eyes 2 (two) times daily.     . Travoprost, BAK Free, (TRAVATAN) 0.004 % SOLN ophthalmic solution Place 1 drop into both eyes at bedtime.     . TURMERIC PO Take 1 tablet by mouth daily.    . vitamin B-12 1000 MCG tablet Take 1 tablet (1,000 mcg total) by mouth daily.    Marland Kitchen zinc gluconate 50 MG tablet Take 50 mg by mouth daily.    Marland Kitchen enoxaparin (LOVENOX) 40 MG/0.4ML injection Inject 0.4 mLs (40 mg total) into the skin daily for 7 days.    . metFORMIN (GLUCOPHAGE) 500 MG tablet Take 1 tablet (500 mg total) by mouth 2 (two) times daily with a meal. 120 tablet 0   No current facility-administered medications for this visit.     Abtx:  Anti-infectives (From admission, onward)   None      REVIEW OF SYSTEMS:  Const: negative fever, negative chills, negative weight loss Eyes: negative diplopia or visual changes, negative eye pain ENT: negative coryza, negative sore throat Resp: negative cough, hemoptysis, dyspnea Cards: negative for chest pain, palpitations, lower extremity edema GU: negative for frequency, dysuria and hematuria GI: Negative for abdominal pain, diarrhea, bleeding, constipation Skin: negative for  rash and pruritus Heme: negative for easy bruising and gum/nose bleeding MS: negative for myalgias, arthralgias, back pain  and muscle weakness Neurolo:negative for headaches, dizziness, vertigo, memory problems  Psych: negative for feelings of anxiety, depression  Endocrine: Recently diagnosed diabetes mellitus on insulin. Allergy/Immunology- negative for any medication or food allergies ?  Objective:  VITALS:  BP 113/79   Pulse (!) 110   Resp 16   Ht _0  (1.88 m)   Wt 164 lb (74.4 kg)   SpO2 93%   BMI 21.06 kg/m  PHYSICAL EXAM:  General: Alert, cooperative, no distress, appears stated age.  Head: Normocephalic, without obvious abnormality, atraumatic. Eyes: Blind left eye and partial vision right eye ENT Nares normal. No drainage or sinus tenderness. Lips, mucosa, and tongue normal. No Thrush Neck: Supple, symmetrical, no adenopathy, thyroid: non tender no carotid bruit and no JVD. Back: No CVA tenderness. Lungs: Clear to auscultation bilaterally. No Wheezing or Rhonchi. No rales. Heart: Regular rate and rhythm, no murmur, rub or gallop. Abdomen: Soft, non-tender,not distended. Bowel sounds normal. No masses Extremities: Right foot dressing removed  The amputation site is well approximated and has healed well with no discharge or erythema dryness of the skin around   Sutures still present PICC site on the right arm is clean skin: No rashes or lesions. Or bruising Lymph: Cervical, supraclavicular normal. Neurologic: Grossly non-focal Pertinent Labs Lab Results CBC    Component Value Date/Time   WBC 8.9 11/20/2019 0450   RBC 3.89 (L) 11/20/2019 0450   HGB 9.1 (L) 11/20/2019 0450   HCT 28.9 (L) 11/20/2019 0450   PLT 567 (H) 11/20/2019 0450   MCV 74.3 (L) 11/20/2019 0450   MCH 23.4 (L) 11/20/2019 0450   MCHC 31.5 11/20/2019 0450   RDW 14.3 11/20/2019 0450   LYMPHSABS 1.3 11/06/2019 0450   MONOABS 1.1 (H) 11/06/2019 0450   EOSABS 0.0 11/06/2019 0450   BASOSABS 0.0 11/06/2019 0450    CMP Latest Ref Rng & Units 11/20/2019 11/19/2019 11/18/2019  Glucose 70 - 99 mg/dL 158(H) 135(H) -    BUN 8 - 23 mg/dL 24(H) 26(H) -  Creatinine 0.61 - 1.24 mg/dL 0.94 1.05 -  Sodium 135 - 145 mmol/L 129(L) 130(L) -  Potassium 3.5 - 5.1 mmol/L 4.9 4.8 5.1  Chloride 98 - 111 mmol/L 94(L) 96(L) -  CO2 22 - 32 mmol/L 30 28 -  Calcium 8.9 - 10.3 mg/dL 8.6(L) 8.6(L) -  Total Protein 6.5 - 8.1 g/dL - - -  Total Bilirubin 0.3 - 1.2 mg/dL - - -  Alkaline Phos 38 - 126 U/L - - -  AST 15 - 41 U/L - - -  ALT 0 - 44 U/L - - -      Microbiology: No results found for this or any previous visit (from the past 240 hour(s)).  IMAGING RESULTS: I have personally reviewed the films ? Impression/Recommendation ?MSSA bacteremia on cefazolin repeat blood cultures were negative and 2D echo was normal.  He is getting 6 weeks of IV antibiotics and will completed on 12/17/2019  Right great toe infection with osteomyelitis. MRI showed Soft tissue ulcer along the medial first MTP joint with sinus tract extending to the first metatarsal head. 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  After initial refusal underwent  post ray excision of the toe on 11/17/2019.  Cultures had MSSA, strep and Kleb and all sensitive to cefazolin  and he will complete a total of 6 weeks on 12/17/2019.  Newly diagnosed diabetes mellitus on insulin  Glaucoma with blindness left eye and poor vision right eye. ? ?After he completes antibiotic on 12/17/2019 the PICC line can be removed. He is to follow-up with podiatrist to remove the sutures Follow-up with me as as needed [facility] to get [facility]. ___________________________________________________ Discussed with patient in great detail.  Note:  This document was prepared using Dragon voice recognition software and may include unintentional dictation errors.

## 2019-12-12 ENCOUNTER — Inpatient Hospital Stay: Payer: Medicare Other | Admitting: Infectious Diseases

## 2019-12-26 ENCOUNTER — Telehealth: Payer: Self-pay

## 2019-12-26 NOTE — Telephone Encounter (Signed)
A Home Health Medicare therapst names Ileene Rubens was doing an evaluation for PT and patient refused PT. He called [place] because he said patient can not transfer, can not walk, is wearing only bare feet and dirty bandage over his foot. He said the house is not sanitary and he can not see to do his insulin due to glaucoma and states he does all his medications himself even with poor vision. He was offered nurse to come assist in things and PT to help with transfer and walking but he refused all services. Called son and left message and also called patient. He states he declines PT and his foot is doing well. He has not scheduled PCP yet but was given several names and I even offered to call and schedule one myself for the patient. Advised when he runs out of insulin he can not get refill from ED or anyone else treating him at this time. He understood and said he will set up PCP today. Also advised the importance of insulin and what can happen to his other foot if he lets this get out of control. He expressed understanding.

## 2020-01-02 ENCOUNTER — Telehealth: Payer: Self-pay

## 2020-01-02 NOTE — Telephone Encounter (Signed)
Spoke to son, Cristal Deer regarding death of patient. He states his dad was found deceased in the home on Dec 31, 2019 between the hours of 7:00 PM and 8:00  PM. The cause of death was unknown. Cristal Deer reports that patient had the PICC line in his arm at [facility] because patient would not allow anyone prior to his death to remove it. I informed Cristal Deer that per Dr. Rivka Safer, [facility] in [place] would need to arrange for signing of the death certificate as he was not under her care at [facility]. Expressed deepest sympathy and patient thanked [place] for caring for his father.   I also returned call to funeral home and instructed them that per Dr. Rivka Safer they will need to contact [facility] regarding death certificate due to him not being under her care at [facility] and [facility] was the [facility] provider who saw patient.

## 2020-01-02 NOTE — Telephone Encounter (Signed)
Thanks for talking to the son . I am sorry to hear about the patient- When I saw him on 11/16 , [facility] and had a [facility] staff [facility] him during the visit., I sent a note to [facility] And also told the person who accompanied him that after  finishing his antibiotics on 11/23 the PICC line will have to be removed. I am not sure when he was discharged from [facility] and how he was discharged without removing the PICC line.We will have to look into this further, will have to talk to [facility].

## 2020-01-07 LAB — PROTIME-INR

## 2020-01-09 ENCOUNTER — Encounter: Payer: Self-pay | Admitting: Infectious Diseases

## 2020-01-25 DEATH — deceased

## 2022-01-31 IMAGING — MR MR HIP*R* WO/W CM
8 series · 40 of 40 positions shown · IV contrast (8ml Gadavist)
Comparison: Right femur x-rays from same day.

CLINICAL DATA: Right hip pain.  Bacteremia.

EXAM:
MRI OF THE RIGHT HIP WITHOUT AND WITH CONTRAST
TECHNIQUE: Multiplanar, multisequence MR imaging was performed both before and
after administration of intravenous contrast.
CONTRAST:  8mL GADAVIST GADOBUTROL 1 MMOL/ML IV SOLN

[Series 30: T1 · coronal · 4.0mm · 0.85mm/px · 5 of 40 slices shown]
[im 1/40]
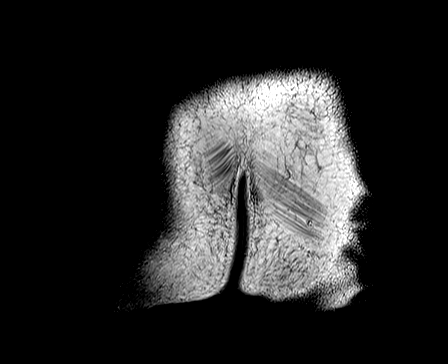
[im 10/40]
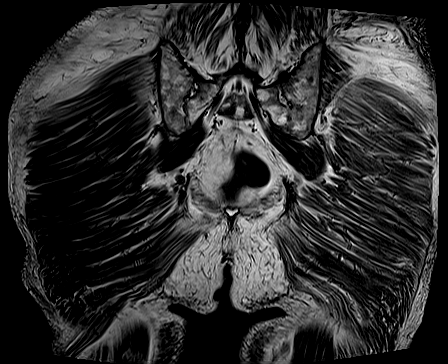
[im 20/40]
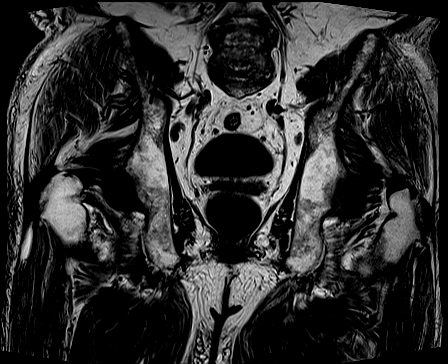
[im 30/40]
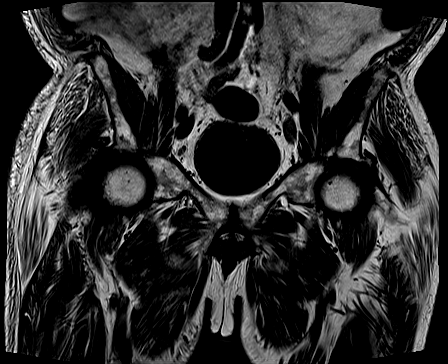
[im 40/40]
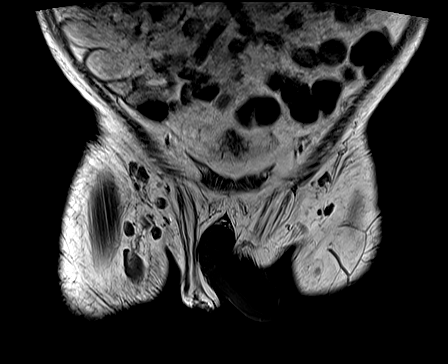

[Series 31: T2 fat-sat · coronal · 4.0mm · 1.03mm/px · 5 of 40 slices shown (1 of 2)]
[im 1/40]
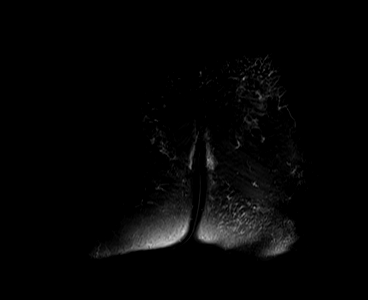
[im 10/40]
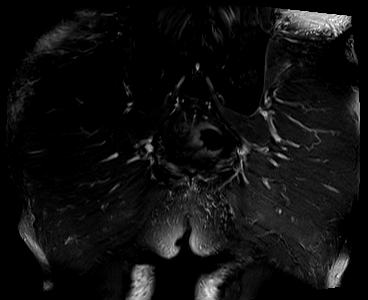
[im 20/40]
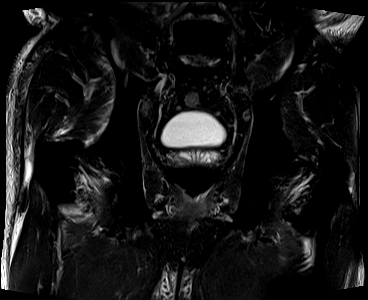
[im 30/40]
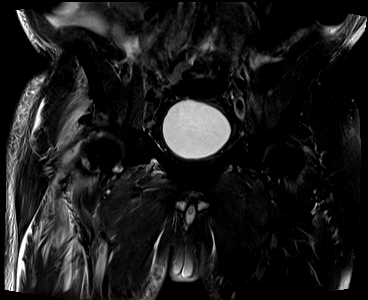
[im 40/40]
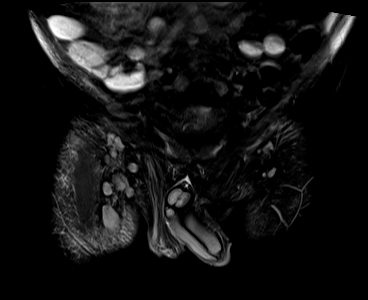

[Series 32: T2 fat-sat · axial · 4.0mm · 0.35mm/px · z∈[-282,-137]mm · 5 of 30 slices shown (2 of 2)]
[im 1/30]
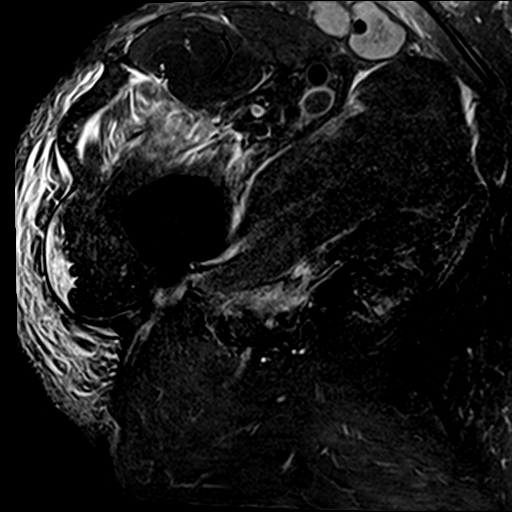
[im 8/30]
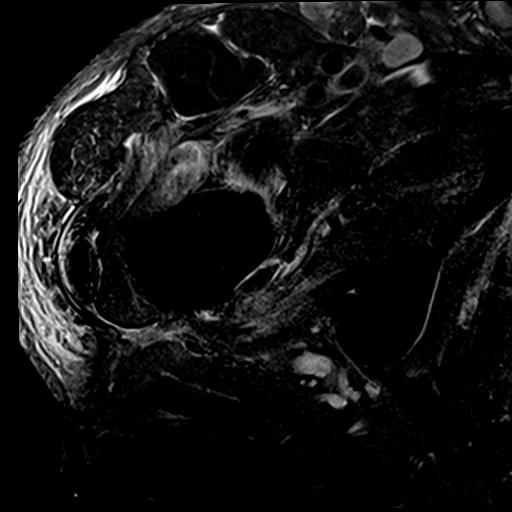
[im 15/30]
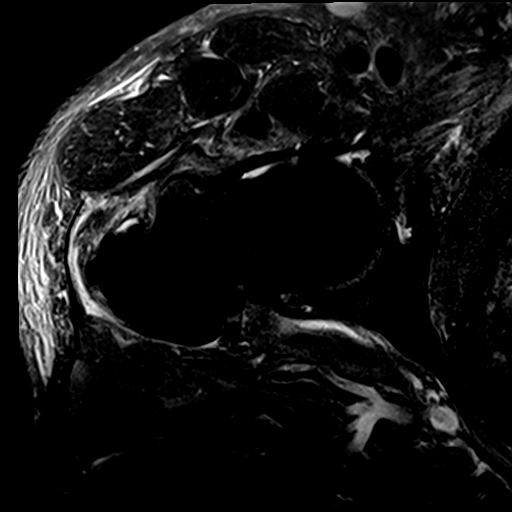
[im 22/30]
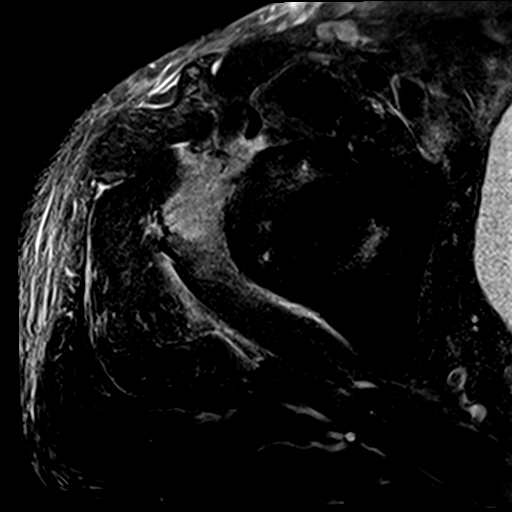
[im 30/30]
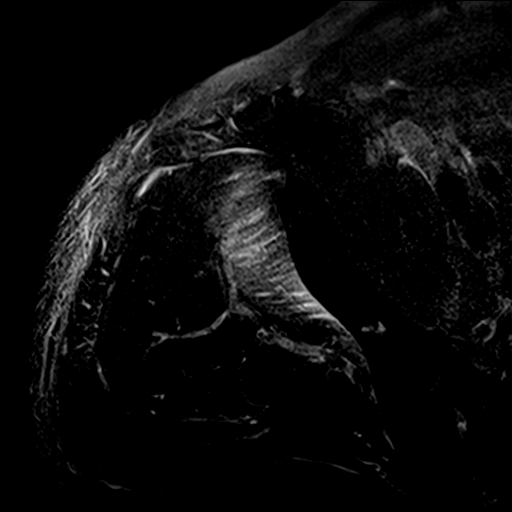

[Series 33: T1 fat-sat · axial · non-contrast · 4.0mm · 0.87mm/px · z∈[-282,-137]mm · 5 of 30 slices shown]
[im 1/30]
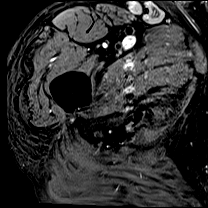
[im 8/30]
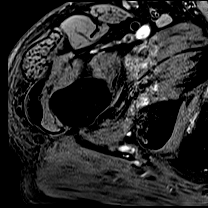
[im 15/30]
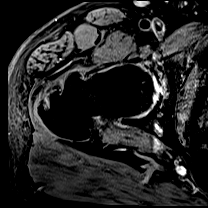
[im 22/30]
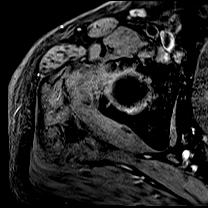
[im 30/30]
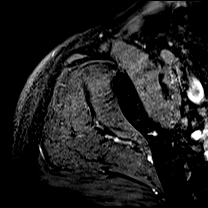

[Series 34: PD fat-sat · sagittal · 4.0mm · 0.70mm/px · 5 of 29 slices shown (1 of 2)]
[im 1/29]
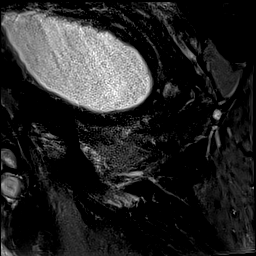
[im 8/29]
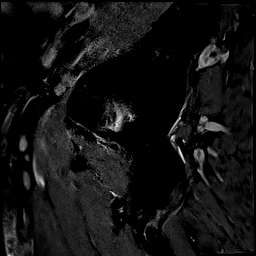
[im 15/29]
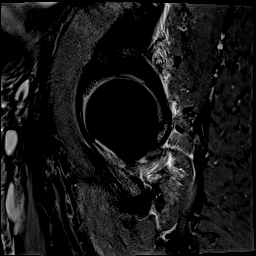
[im 22/29]
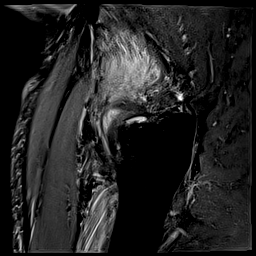
[im 29/29]
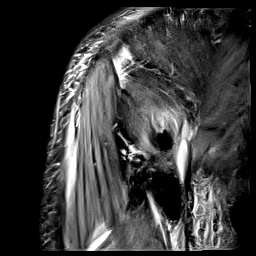

[Series 35: PD fat-sat · coronal · 4.0mm · 0.70mm/px · 5 of 29 slices shown (2 of 2)]
[im 1/29]
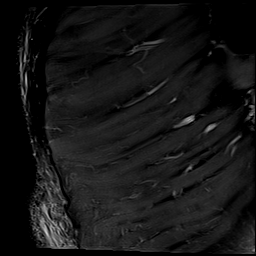
[im 8/29]
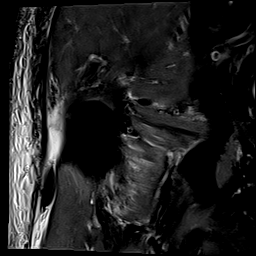
[im 15/29]
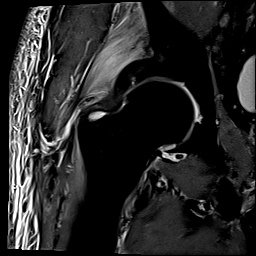
[im 22/29]
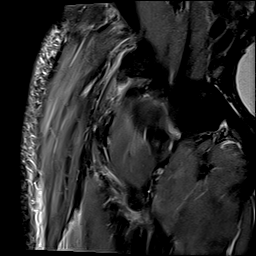
[im 29/29]
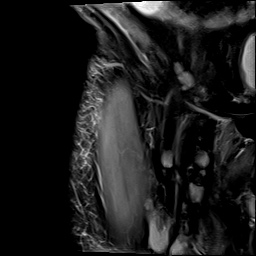

[Series 36: T1 fat-sat post-contrast · axial · 4.0mm · 0.87mm/px · z∈[-282,-137]mm · 5 of 30 slices shown (1 of 2)]
[im 1/30]
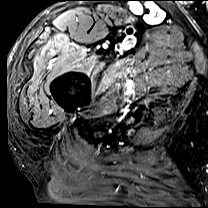
[im 8/30]
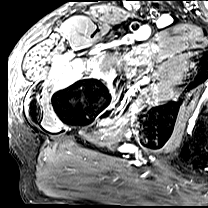
[im 15/30]
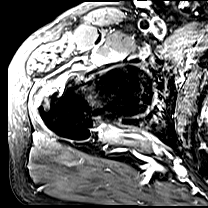
[im 22/30]
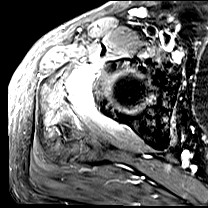
[im 30/30]
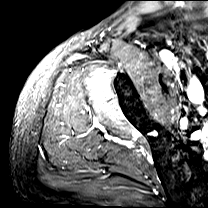

[Series 37: T1 fat-sat post-contrast · coronal · 4.0mm · 0.87mm/px · 5 of 29 slices shown (2 of 2)]
[im 1/29]
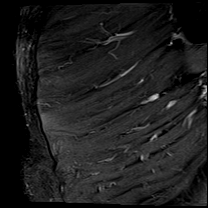
[im 8/29]
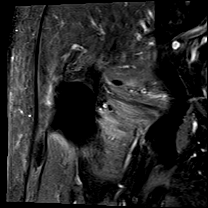
[im 15/29]
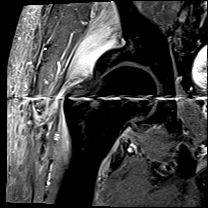
[im 22/29]
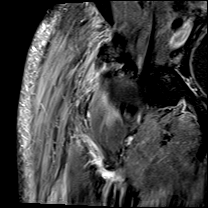
[im 29/29]
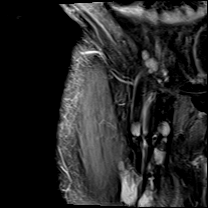

[40 of 40 positions shown; findings below may reference images not displayed]

FINDINGS: Bones: There is no evidence of acute fracture, dislocation or
avascular necrosis. Multiple hemangiomas involving the sacrum and
bilateral iliac bones. Ankylosis of the bilateral sacroiliac joints.
Normal pubic symphysis.

Articular cartilage and labrum

Articular cartilage: Scattered partial and full-thickness cartilage
loss in both hip joints with subchondral marrow edema and cystic
change in the acetabula and bulky marginal acetabular osteophytes.

Labrum: Degenerated right anterior superior labrum.

Joint or bursal effusion

Joint effusion: Trace right hip joint effusion with 7 mm
intra-articular body (series 32, image 18). No synovial enhancement.

Bursae: Small amount of fluid in the right greater than left greater
trochanteric bursae.

Muscles and tendons

Muscles and tendons: Prominent edema in the right gluteus minimus
muscle with enhancement. Lesser edema in the proximal vastus muscles
without significant enhancement. No significant muscle atrophy. The
visualized gluteus, hamstring and iliopsoas tendons are intact.

Other findings

Miscellaneous: Subcutaneous edema overlying the right hip, without
enhancement. Multiple enlarged right inguinal and external iliac
lymph nodes, likely reactive.

The visualized internal pelvic contents appear unremarkable. Trace
free fluid in the pelvis is nonspecific.
IMPRESSION: 1. No evidence of osteomyelitis. No findings suggestive of septic
arthritis.
2. Prominent edema in the right gluteus minimus muscle, concerning
for myositis in the absence of trauma.
3. Moderate bilateral hip osteoarthritis. Trace right hip joint
effusion with 7 mm intra-articular body.
4. Mild right greater than left greater trochanteric bursitis.
